# Patient Record
Sex: Male | Born: 1945 | Race: White | Hispanic: No | Marital: Married | Smoking: Former smoker
Health system: Southern US, Community
[De-identification: ages and names within clinical notes are randomized; demographics above are authoritative.]

## PROBLEM LIST (undated history)

## (undated) DIAGNOSIS — I10 Essential (primary) hypertension: Secondary | ICD-10-CM

## (undated) DIAGNOSIS — J449 Chronic obstructive pulmonary disease, unspecified: Secondary | ICD-10-CM

## (undated) DIAGNOSIS — I5020 Unspecified systolic (congestive) heart failure: Secondary | ICD-10-CM

## (undated) DIAGNOSIS — I420 Dilated cardiomyopathy: Secondary | ICD-10-CM

## (undated) DIAGNOSIS — R011 Cardiac murmur, unspecified: Secondary | ICD-10-CM

## (undated) DIAGNOSIS — E785 Hyperlipidemia, unspecified: Secondary | ICD-10-CM

## (undated) DIAGNOSIS — I739 Peripheral vascular disease, unspecified: Secondary | ICD-10-CM

## (undated) DIAGNOSIS — I4891 Unspecified atrial fibrillation: Secondary | ICD-10-CM

## (undated) DIAGNOSIS — I493 Ventricular premature depolarization: Secondary | ICD-10-CM

## (undated) HISTORY — DX: Peripheral vascular disease, unspecified: I73.9

## (undated) HISTORY — DX: Dilated cardiomyopathy: I42.0

## (undated) HISTORY — PX: TONSILLECTOMY: SUR1361

## (undated) HISTORY — DX: Ventricular premature depolarization: I49.3

## (undated) HISTORY — PX: MANDIBLE SURGERY: SHX707

## (undated) HISTORY — PX: LEG SURGERY: SHX1003

## (undated) HISTORY — DX: Essential (primary) hypertension: I10

## (undated) HISTORY — DX: Unspecified systolic (congestive) heart failure: I50.20

## (undated) HISTORY — PX: HERNIA REPAIR: SHX51

## (undated) HISTORY — PX: KNEE ARTHROSCOPY: SUR90

## (undated) HISTORY — DX: Unspecified atrial fibrillation: I48.91

## (undated) HISTORY — DX: Chronic obstructive pulmonary disease, unspecified: J44.9

## (undated) HISTORY — DX: Hyperlipidemia, unspecified: E78.5

---

## 2004-04-19 ENCOUNTER — Ambulatory Visit: Payer: Self-pay | Admitting: General Practice

## 2013-02-12 ENCOUNTER — Other Ambulatory Visit: Payer: Self-pay | Admitting: Orthopedic Surgery

## 2013-02-12 DIAGNOSIS — M545 Low back pain, unspecified: Secondary | ICD-10-CM

## 2013-02-14 ENCOUNTER — Other Ambulatory Visit: Payer: Self-pay | Admitting: Orthopedic Surgery

## 2013-02-14 DIAGNOSIS — M545 Low back pain, unspecified: Secondary | ICD-10-CM

## 2013-02-15 ENCOUNTER — Other Ambulatory Visit: Payer: Self-pay

## 2013-02-19 ENCOUNTER — Other Ambulatory Visit: Payer: Self-pay

## 2013-02-28 ENCOUNTER — Ambulatory Visit
Admission: RE | Admit: 2013-02-28 | Discharge: 2013-02-28 | Disposition: A | Discharge status: Home / Self Care | Payer: 59 | Source: Ambulatory Visit | Attending: Orthopedic Surgery | Admitting: Orthopedic Surgery

## 2013-02-28 DIAGNOSIS — M545 Low back pain, unspecified: Secondary | ICD-10-CM

## 2014-02-02 ENCOUNTER — Emergency Department: Payer: Self-pay | Admitting: Emergency Medicine

## 2014-02-02 LAB — CBC
HCT: 44 % (ref 40.0–52.0)
HGB: 14.7 g/dL (ref 13.0–18.0)
MCH: 29.1 pg (ref 26.0–34.0)
MCHC: 33.3 g/dL (ref 32.0–36.0)
MCV: 87 fL (ref 80–100)
PLATELETS: 329 10*3/uL (ref 150–440)
RBC: 5.05 10*6/uL (ref 4.40–5.90)
RDW: 13.1 % (ref 11.5–14.5)
WBC: 9.3 10*3/uL (ref 3.8–10.6)

## 2014-02-02 LAB — COMPREHENSIVE METABOLIC PANEL
ALT: 21 U/L
ANION GAP: 6 — AB (ref 7–16)
Albumin: 3.9 g/dL (ref 3.4–5.0)
Alkaline Phosphatase: 73 U/L
BUN: 15 mg/dL (ref 7–18)
Bilirubin,Total: 0.4 mg/dL (ref 0.2–1.0)
Calcium, Total: 9 mg/dL (ref 8.5–10.1)
Chloride: 104 mmol/L (ref 98–107)
Co2: 29 mmol/L (ref 21–32)
Creatinine: 1.04 mg/dL (ref 0.60–1.30)
EGFR (African American): >60
EGFR (Non-African Amer.): >60
GLUCOSE: 107 mg/dL — AB (ref 65–99)
OSMOLALITY: 279 (ref 275–301)
Potassium: 3.9 mmol/L (ref 3.5–5.1)
SGOT(AST): 16 U/L (ref 15–37)
SODIUM: 139 mmol/L (ref 136–145)
Total Protein: 7.3 g/dL (ref 6.4–8.2)

## 2014-02-02 LAB — PRO B NATRIURETIC PEPTIDE: B-TYPE NATIURETIC PEPTID: 58 pg/mL (ref 0–125)

## 2014-02-02 LAB — TROPONIN I
Troponin-I: <0.02 ng/mL
Troponin-I: <0.02 ng/mL

## 2014-02-02 LAB — CK TOTAL AND CKMB (NOT AT ARMC)
CK, Total: 149 U/L
CK-MB: 6.8 ng/mL — ABNORMAL HIGH (ref 0.5–3.6)

## 2014-02-02 LAB — D-DIMER(ARMC): D-DIMER: 695 ng/mL

## 2014-10-15 ENCOUNTER — Ambulatory Visit
Admit: 2014-10-15 | Disposition: A | Discharge status: Home / Self Care | Payer: Self-pay | Attending: Internal Medicine | Admitting: Internal Medicine

## 2015-10-25 ENCOUNTER — Other Ambulatory Visit: Payer: Self-pay | Admitting: Physician Assistant

## 2015-10-25 DIAGNOSIS — R911 Solitary pulmonary nodule: Secondary | ICD-10-CM

## 2015-11-01 ENCOUNTER — Ambulatory Visit
Admission: RE | Admit: 2015-11-01 | Discharge: 2015-11-01 | Disposition: A | Discharge status: Home / Self Care | Payer: Medicare Other | Source: Ambulatory Visit | Attending: Physician Assistant | Admitting: Physician Assistant

## 2015-11-01 DIAGNOSIS — R911 Solitary pulmonary nodule: Secondary | ICD-10-CM

## 2015-11-01 DIAGNOSIS — R918 Other nonspecific abnormal finding of lung field: Secondary | ICD-10-CM | POA: Insufficient documentation

## 2015-11-01 LAB — POCT I-STAT CREATININE: Creatinine, Ser: 0.9 mg/dL (ref 0.61–1.24)

## 2015-11-01 MED ORDER — IOPAMIDOL (ISOVUE-300) INJECTION 61%
75.0000 mL | Freq: Once | INTRAVENOUS | Status: AC | PRN
  Administered 2015-11-01: 75 mL via INTRAVENOUS

## 2015-11-08 DIAGNOSIS — I4510 Unspecified right bundle-branch block: Secondary | ICD-10-CM | POA: Diagnosis not present

## 2015-11-08 DIAGNOSIS — R911 Solitary pulmonary nodule: Secondary | ICD-10-CM | POA: Diagnosis not present

## 2015-11-08 DIAGNOSIS — J449 Chronic obstructive pulmonary disease, unspecified: Secondary | ICD-10-CM | POA: Diagnosis not present

## 2015-11-12 DIAGNOSIS — I493 Ventricular premature depolarization: Secondary | ICD-10-CM | POA: Diagnosis not present

## 2015-11-12 DIAGNOSIS — R0602 Shortness of breath: Secondary | ICD-10-CM | POA: Diagnosis not present

## 2015-11-12 DIAGNOSIS — R002 Palpitations: Secondary | ICD-10-CM | POA: Insufficient documentation

## 2015-12-01 DIAGNOSIS — J449 Chronic obstructive pulmonary disease, unspecified: Secondary | ICD-10-CM | POA: Diagnosis not present

## 2015-12-01 DIAGNOSIS — F17210 Nicotine dependence, cigarettes, uncomplicated: Secondary | ICD-10-CM | POA: Diagnosis not present

## 2015-12-01 DIAGNOSIS — N400 Enlarged prostate without lower urinary tract symptoms: Secondary | ICD-10-CM | POA: Diagnosis not present

## 2015-12-01 DIAGNOSIS — Z1211 Encounter for screening for malignant neoplasm of colon: Secondary | ICD-10-CM | POA: Diagnosis not present

## 2015-12-01 DIAGNOSIS — Z0001 Encounter for general adult medical examination with abnormal findings: Secondary | ICD-10-CM | POA: Diagnosis not present

## 2015-12-13 DIAGNOSIS — Z1211 Encounter for screening for malignant neoplasm of colon: Secondary | ICD-10-CM | POA: Diagnosis not present

## 2015-12-13 DIAGNOSIS — Z1212 Encounter for screening for malignant neoplasm of rectum: Secondary | ICD-10-CM | POA: Diagnosis not present

## 2016-01-12 DIAGNOSIS — R0602 Shortness of breath: Secondary | ICD-10-CM | POA: Diagnosis not present

## 2016-04-27 ENCOUNTER — Other Ambulatory Visit: Payer: Self-pay | Admitting: Internal Medicine

## 2016-04-27 DIAGNOSIS — R911 Solitary pulmonary nodule: Secondary | ICD-10-CM

## 2016-05-10 ENCOUNTER — Ambulatory Visit
Admission: RE | Admit: 2016-05-10 | Discharge: 2016-05-10 | Disposition: A | Discharge status: Home / Self Care | Payer: Medicare Other | Source: Ambulatory Visit | Attending: Internal Medicine | Admitting: Internal Medicine

## 2016-05-10 DIAGNOSIS — I2510 Atherosclerotic heart disease of native coronary artery without angina pectoris: Secondary | ICD-10-CM | POA: Insufficient documentation

## 2016-05-10 DIAGNOSIS — R918 Other nonspecific abnormal finding of lung field: Secondary | ICD-10-CM | POA: Diagnosis not present

## 2016-05-10 DIAGNOSIS — R911 Solitary pulmonary nodule: Secondary | ICD-10-CM | POA: Diagnosis not present

## 2016-05-10 LAB — POCT I-STAT CREATININE: Creatinine, Ser: 0.8 mg/dL (ref 0.61–1.24)

## 2016-05-10 MED ORDER — IOPAMIDOL (ISOVUE-300) INJECTION 61%
75.0000 mL | Freq: Once | INTRAVENOUS | Status: AC | PRN
  Administered 2016-05-10: 75 mL via INTRAVENOUS

## 2016-05-15 DIAGNOSIS — R0602 Shortness of breath: Secondary | ICD-10-CM | POA: Diagnosis not present

## 2016-05-15 DIAGNOSIS — R911 Solitary pulmonary nodule: Secondary | ICD-10-CM | POA: Diagnosis not present

## 2016-05-15 DIAGNOSIS — G4710 Hypersomnia, unspecified: Secondary | ICD-10-CM | POA: Diagnosis not present

## 2016-05-15 DIAGNOSIS — J449 Chronic obstructive pulmonary disease, unspecified: Secondary | ICD-10-CM | POA: Diagnosis not present

## 2016-09-26 DIAGNOSIS — G4710 Hypersomnia, unspecified: Secondary | ICD-10-CM | POA: Diagnosis not present

## 2016-12-08 DIAGNOSIS — Z0001 Encounter for general adult medical examination with abnormal findings: Secondary | ICD-10-CM | POA: Diagnosis not present

## 2016-12-08 DIAGNOSIS — L209 Atopic dermatitis, unspecified: Secondary | ICD-10-CM | POA: Diagnosis not present

## 2016-12-08 DIAGNOSIS — J449 Chronic obstructive pulmonary disease, unspecified: Secondary | ICD-10-CM | POA: Diagnosis not present

## 2016-12-08 DIAGNOSIS — G4710 Hypersomnia, unspecified: Secondary | ICD-10-CM | POA: Diagnosis not present

## 2016-12-08 DIAGNOSIS — R69 Illness, unspecified: Secondary | ICD-10-CM | POA: Diagnosis not present

## 2016-12-08 DIAGNOSIS — N400 Enlarged prostate without lower urinary tract symptoms: Secondary | ICD-10-CM | POA: Diagnosis not present

## 2017-04-11 DIAGNOSIS — K122 Cellulitis and abscess of mouth: Secondary | ICD-10-CM | POA: Diagnosis not present

## 2017-04-11 DIAGNOSIS — N400 Enlarged prostate without lower urinary tract symptoms: Secondary | ICD-10-CM | POA: Diagnosis not present

## 2017-06-07 ENCOUNTER — Ambulatory Visit: Payer: Self-pay | Admitting: Internal Medicine

## 2017-07-27 ENCOUNTER — Other Ambulatory Visit: Payer: Self-pay

## 2017-07-27 MED ORDER — TAMSULOSIN HCL 0.4 MG PO CAPS: 0.4000 mg | ORAL_CAPSULE | Freq: Every day | ORAL | 0 refills | Status: DC

## 2017-07-31 ENCOUNTER — Other Ambulatory Visit: Payer: Self-pay

## 2017-07-31 MED ORDER — TAMSULOSIN HCL 0.4 MG PO CAPS: 0.4000 mg | ORAL_CAPSULE | Freq: Every day | ORAL | 0 refills | Status: DC

## 2017-09-04 ENCOUNTER — Other Ambulatory Visit: Payer: Self-pay

## 2017-09-04 MED ORDER — TAMSULOSIN HCL 0.4 MG PO CAPS: 0.4000 mg | ORAL_CAPSULE | Freq: Every day | ORAL | 0 refills | Status: DC

## 2017-09-05 ENCOUNTER — Other Ambulatory Visit: Payer: Self-pay

## 2017-10-23 ENCOUNTER — Other Ambulatory Visit: Payer: Self-pay

## 2017-10-23 MED ORDER — TAMSULOSIN HCL 0.4 MG PO CAPS: 0.4000 mg | ORAL_CAPSULE | Freq: Every day | ORAL | 0 refills | Status: DC

## 2017-10-26 ENCOUNTER — Ambulatory Visit (INDEPENDENT_AMBULATORY_CARE_PROVIDER_SITE_OTHER): Payer: Medicare HMO | Admitting: Nurse Practitioner

## 2017-10-26 ENCOUNTER — Other Ambulatory Visit: Payer: Self-pay

## 2017-10-26 ENCOUNTER — Encounter: Payer: Self-pay | Admitting: Nurse Practitioner

## 2017-10-26 VITALS — BP 140/79 | HR 60 | Resp 16 | Ht 73.0 in | Wt 253.8 lb

## 2017-10-26 DIAGNOSIS — R300 Dysuria: Secondary | ICD-10-CM

## 2017-10-26 DIAGNOSIS — Z125 Encounter for screening for malignant neoplasm of prostate: Secondary | ICD-10-CM | POA: Diagnosis not present

## 2017-10-26 DIAGNOSIS — Z0001 Encounter for general adult medical examination with abnormal findings: Secondary | ICD-10-CM | POA: Insufficient documentation

## 2017-10-26 DIAGNOSIS — Z91030 Bee allergy status: Secondary | ICD-10-CM

## 2017-10-26 DIAGNOSIS — Z23 Encounter for immunization: Secondary | ICD-10-CM | POA: Diagnosis not present

## 2017-10-26 DIAGNOSIS — Z1159 Encounter for screening for other viral diseases: Secondary | ICD-10-CM

## 2017-10-26 MED ORDER — PNEUMOCOCCAL 13-VAL CONJ VACC IM SUSP: 0.5000 mL | INTRAMUSCULAR | 0 refills | Status: AC

## 2017-10-26 MED ORDER — EPINEPHRINE 0.15 MG/0.15ML IJ SOAJ: 0.1500 mg | INTRAMUSCULAR | 1 refills | Status: DC | PRN

## 2017-10-26 NOTE — Progress Notes (Signed)
[facility] [address] [place], [place] [address]  Internal MEDICINE  Office Visit Note  Patient Name: Casey Arias  [id number]  [id number]  Date of Service: 10/26/2017   Pt is here for routine health maintenance examinatio  Chief Complaint  Patient presents with  . Annual Exam  . Allergic Reaction    bee stings. needs refill for his epipens     The patient states that he is developing intermittent constipation. Is having some hard and small stools.notes a little cramping. Denies presence of blood in stool or other complications. 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 Prescription for EpiPen is expired and he needs a new one. He is due to have routine, fasting labs.   n  Current Medication: Outpatient Encounter Medications as of 10/26/2017  Medication Sig  . EPINEPHrine 0.15 MG/0.15ML IJ injection Inject 0.15 mLs (0.15 mg total) into the muscle as needed for anaphylaxis.  Marland Kitchen tamsulosin (FLOMAX) 0.4 MG CAPS capsule Take 1 capsule (0.4 mg total) by mouth daily.  Marland Kitchen triamcinolone cream (KENALOG) 0.1 % continuously as needed.  . [DISCONTINUED] EPINEPHrine 0.15 MG/0.15ML IJ injection Inject into the muscle.  . pneumococcal 13-valent conjugate vaccine (PREVNAR 13) SUSP injection Inject 0.5 mLs into the muscle tomorrow at 10 am for 1 dose.   No facility-administered encounter medications on file as of 10/26/2017.     Surgical History: History reviewed. No pertinent surgical history.  Medical History: Past Medical History:  Diagnosis Date  . COPD (chronic obstructive pulmonary disease) (HCC)   . Hyperlipidemia     Family History: No family history on file.    Review of Systems  Constitutional: Negative for chills, fatigue and unexpected weight change.  HENT: Negative for congestion, postnasal drip, rhinorrhea, sneezing and sore throat.   Eyes: Negative.  Negative for redness.  Respiratory: Negative for cough, chest tightness, shortness of breath and  wheezing.   Cardiovascular: Negative for chest pain and palpitations.  Gastrointestinal: Positive for constipation. Negative for abdominal pain, diarrhea, nausea and vomiting.  Endocrine: Negative for cold intolerance, heat intolerance, polydipsia, polyphagia and polyuria.  Genitourinary: Negative for dysuria, frequency, testicular pain and urgency.  Musculoskeletal: Negative for arthralgias, back pain, joint swelling and neck pain.  Skin: Negative for rash.  Allergic/Immunologic:       Allergy to bee stings.   Neurological: Negative.  Negative for tremors and numbness.  Hematological: Negative for adenopathy. Does not bruise/bleed easily.  Psychiatric/Behavioral: Negative for behavioral problems (Depression), sleep disturbance and suicidal ideas. The patient is not nervous/anxious.      Vital Signs: BP 140/79 (BP Location: Right Arm, Patient Position: Sitting, Cuff Size: Normal)   Pulse 60   Resp 16   Ht 6\' 1"  (1.854 m)   Wt 253 lb 12.8 oz (115.1 kg)   SpO2 96%   BMI 33.48 kg/m    Physical Exam  Constitutional: He is oriented to person, place, and time. He appears well-developed and well-nourished. No distress.  HENT:  Head: Normocephalic and atraumatic.  Mouth/Throat: Oropharynx is clear and moist. No oropharyngeal exudate.  Eyes: Pupils are equal, round, and reactive to light. EOM are normal.  Neck: Normal range of motion. Neck supple. No JVD present. No tracheal deviation present. No thyromegaly present.  Cardiovascular: Normal rate, regular rhythm and normal heart sounds. Exam reveals no gallop and no friction rub.  No murmur heard. Pulmonary/Chest: Effort normal. No respiratory distress. He has no wheezes. He has no rales. He exhibits no tenderness.  Abdominal: Soft.  Bowel sounds are normal.  Musculoskeletal: Normal range of motion.  Lymphadenopathy:    He has no cervical adenopathy.  Neurological: He is alert and oriented to person, place, and time. No cranial nerve  deficit.  Skin: Skin is warm and dry. He is not diaphoretic.  Psychiatric: He has a normal mood and affect. His behavior is normal. Judgment and thought content normal.  Nursing note and vitals reviewed.  Assessment/Plan: 1. Encounter for general adult medical examination with abnormal findings Annual health maintenance exam. Routine fasting labs ordered.  - CBC with Differential/Platelet - Comprehensive metabolic panel - Lipid panel - TSH  2. Allergy to bee sting Renewed rx for epipen. Use as indicated.  - EPINEPHrine 0.15 MG/0.15ML IJ injection; Inject 0.15 mLs (0.15 mg total) into the muscle as needed for anaphylaxis.  Dispense: 2 Device; Refill: 1  3. Screening for prostate cancer - PSA  4. Need for prophylactic vaccination against Streptococcus pneumoniae (pneumococcus) - pneumococcal 13-valent conjugate vaccine (PREVNAR 13) SUSP injection; Inject 0.5 mLs into the muscle tomorrow at 10 am for 1 dose.  Dispense: 0.5 mL; Refill: 0  5. Dysuria - Urinalysis, Routine w reflex microscopic  General Counseling: Casey Arias understanding of the findings of todays visit and agrees with plan of treatment. I have discussed any further diagnostic evaluation that may be needed or ordered today. We also reviewed his medications today. he has been encouraged to call the office with any questions or concerns that should arise related to todays visit.   This patient was seen by Vincent Gros, FNP- C in Collaboration with Dr Lyndon Code as a part of collaborative care agreement    Orders Placed This Encounter  Procedures  . Urinalysis, Routine w reflex microscopic  . PSA  . CBC with Differential/Platelet  . Comprehensive metabolic panel  . Lipid panel  . TSH  . Hepatitis C antibody    Meds ordered this encounter  Medications  . pneumococcal 13-valent conjugate vaccine (PREVNAR 13) SUSP injection    Sig: Inject 0.5 mLs into the muscle tomorrow at 10 am for 1 dose.     Dispense:  0.5 mL    Refill:  0    Order Specific Question:   Supervising Provider    Answer:   Lyndon Code [[id number]]  . EPINEPHrine 0.15 MG/0.15ML IJ injection    Sig: Inject 0.15 mLs (0.15 mg total) into the muscle as needed for anaphylaxis.    Dispense:  2 Device    Refill:  1    Order Specific Question:   Supervising Provider    Answer:   Lyndon Code [[id number]]    Time spent: 34 Minutes      Lyndon Code, MD  Internal Medicine

## 2017-10-26 NOTE — Addendum Note (Signed)
Addended by: Loura Back on: 10/26/2017 04:41 PM   Modules accepted: Orders

## 2017-10-27 LAB — URINALYSIS, ROUTINE W REFLEX MICROSCOPIC
Bilirubin, UA: NEGATIVE
GLUCOSE, UA: NEGATIVE
Ketones, UA: NEGATIVE
Leukocytes, UA: NEGATIVE
NITRITE UA: NEGATIVE
Protein, UA: NEGATIVE
RBC, UA: NEGATIVE
Specific Gravity, UA: 1.01 (ref 1.005–1.030)
UUROB: 0.2 mg/dL (ref 0.2–1.0)
pH, UA: 6 (ref 5.0–7.5)

## 2017-11-05 ENCOUNTER — Other Ambulatory Visit: Payer: Self-pay

## 2017-11-06 ENCOUNTER — Other Ambulatory Visit: Payer: Self-pay | Admitting: Nurse Practitioner

## 2017-11-06 DIAGNOSIS — E782 Mixed hyperlipidemia: Secondary | ICD-10-CM | POA: Diagnosis not present

## 2017-11-06 DIAGNOSIS — R5381 Other malaise: Secondary | ICD-10-CM | POA: Diagnosis not present

## 2017-11-06 DIAGNOSIS — R972 Elevated prostate specific antigen [PSA]: Secondary | ICD-10-CM | POA: Diagnosis not present

## 2017-11-06 DIAGNOSIS — Z1159 Encounter for screening for other viral diseases: Secondary | ICD-10-CM | POA: Diagnosis not present

## 2017-11-06 DIAGNOSIS — I10 Essential (primary) hypertension: Secondary | ICD-10-CM | POA: Diagnosis not present

## 2017-11-06 DIAGNOSIS — E039 Hypothyroidism, unspecified: Secondary | ICD-10-CM | POA: Diagnosis not present

## 2017-11-06 DIAGNOSIS — Z125 Encounter for screening for malignant neoplasm of prostate: Secondary | ICD-10-CM | POA: Diagnosis not present

## 2017-11-06 DIAGNOSIS — Z0001 Encounter for general adult medical examination with abnormal findings: Secondary | ICD-10-CM | POA: Diagnosis not present

## 2017-11-06 DIAGNOSIS — R5383 Other fatigue: Secondary | ICD-10-CM | POA: Diagnosis not present

## 2017-11-07 LAB — CBC WITH DIFFERENTIAL/PLATELET
BASOS: 1 %
Basophils Absolute: 0.1 10*3/uL (ref 0.0–0.2)
EOS (ABSOLUTE): 0.2 10*3/uL (ref 0.0–0.4)
Eos: 3 %
HEMOGLOBIN: 15 g/dL (ref 13.0–17.7)
Hematocrit: 44.4 % (ref 37.5–51.0)
IMMATURE GRANS (ABS): 0 10*3/uL (ref 0.0–0.1)
Immature Granulocytes: 0 %
LYMPHS ABS: 2.8 10*3/uL (ref 0.7–3.1)
LYMPHS: 31 %
MCH: 29.4 pg (ref 26.6–33.0)
MCHC: 33.8 g/dL (ref 31.5–35.7)
MCV: 87 fL (ref 79–97)
MONOCYTES: 8 %
Monocytes Absolute: 0.8 10*3/uL (ref 0.1–0.9)
NEUTROS ABS: 5.2 10*3/uL (ref 1.4–7.0)
Neutrophils: 57 %
Platelets: 302 10*3/uL (ref 150–379)
RBC: 5.11 x10E6/uL (ref 4.14–5.80)
RDW: 13.4 % (ref 12.3–15.4)
WBC: 9 10*3/uL (ref 3.4–10.8)

## 2017-11-07 LAB — T4, FREE: Free T4: 1.28 ng/dL (ref 0.82–1.77)

## 2017-11-07 LAB — COMPREHENSIVE METABOLIC PANEL
ALBUMIN: 4.4 g/dL (ref 3.6–4.8)
ALK PHOS: 59 IU/L (ref 39–117)
ALT: 19 IU/L (ref 0–44)
AST: 23 IU/L (ref 0–40)
Albumin/Globulin Ratio: 1.8 (ref 1.2–2.2)
BILIRUBIN TOTAL: 0.4 mg/dL (ref 0.0–1.2)
BUN/Creatinine Ratio: 16 (ref 10–24)
BUN: 14 mg/dL (ref 8–27)
CHLORIDE: 102 mmol/L (ref 96–106)
CO2: 22 mmol/L (ref 20–29)
CREATININE: 0.88 mg/dL (ref 0.76–1.27)
Calcium: 9.6 mg/dL (ref 8.6–10.2)
GFR calc Af Amer: 101 mL/min/{1.73_m2} (ref >59)
GFR calc non Af Amer: 88 mL/min/{1.73_m2} (ref >59)
Globulin, Total: 2.5 g/dL (ref 1.5–4.5)
Glucose: 112 mg/dL — ABNORMAL HIGH (ref 65–99)
Potassium: 4.3 mmol/L (ref 3.5–5.2)
SODIUM: 139 mmol/L (ref 134–144)
Total Protein: 6.9 g/dL (ref 6.0–8.5)

## 2017-11-07 LAB — LIPID PANEL
CHOLESTEROL TOTAL: 210 mg/dL — AB (ref 100–199)
Chol/HDL Ratio: 4.3 ratio (ref 0.0–5.0)
HDL: 49 mg/dL (ref >39)
LDL CALC: 144 mg/dL — AB (ref 0–99)
TRIGLYCERIDES: 84 mg/dL (ref 0–149)
VLDL CHOLESTEROL CAL: 17 mg/dL (ref 5–40)

## 2017-11-07 LAB — PSA: PROSTATE SPECIFIC AG, SERUM: 5.5 ng/mL — AB (ref 0.0–4.0)

## 2017-11-07 LAB — TSH: TSH: 2.13 u[IU]/mL (ref 0.450–4.500)

## 2017-11-07 LAB — HEPATITIS C ANTIBODY: Hep C Virus Ab: <0.1 s/co ratio (ref 0.0–0.9)

## 2017-11-14 ENCOUNTER — Other Ambulatory Visit: Payer: Self-pay

## 2017-11-14 MED ORDER — EPINEPHRINE 0.3 MG/0.3ML IJ SOAJ: INTRAMUSCULAR | 1 refills | Status: DC

## 2017-11-16 NOTE — Progress Notes (Signed)
Pt was notified. Message will be forwarded to [facility].

## 2017-11-22 ENCOUNTER — Other Ambulatory Visit: Payer: Self-pay | Admitting: Nurse Practitioner

## 2017-11-22 MED ORDER — TAMSULOSIN HCL 0.4 MG PO CAPS: 0.4000 mg | ORAL_CAPSULE | Freq: Every day | ORAL | 3 refills | Status: DC

## 2017-11-30 ENCOUNTER — Telehealth: Payer: Self-pay

## 2017-11-30 NOTE — Telephone Encounter (Signed)
Lab order faxed to [facility] on 11/05/17 for test, CMP, Lipid panel w/TC_HDL, CBC w Diff, PSA, Thyroxine t4 free, TSH, Hep C antibody.  dbs

## 2017-12-25 ENCOUNTER — Encounter: Payer: Self-pay | Admitting: Urology

## 2017-12-25 ENCOUNTER — Ambulatory Visit (INDEPENDENT_AMBULATORY_CARE_PROVIDER_SITE_OTHER): Payer: Medicare HMO | Admitting: Urology

## 2017-12-25 VITALS — BP 135/74 | HR 54 | Ht 73.0 in | Wt 252.8 lb

## 2017-12-25 DIAGNOSIS — N401 Enlarged prostate with lower urinary tract symptoms: Secondary | ICD-10-CM

## 2017-12-25 DIAGNOSIS — N138 Other obstructive and reflux uropathy: Secondary | ICD-10-CM | POA: Diagnosis not present

## 2017-12-25 DIAGNOSIS — R972 Elevated prostate specific antigen [PSA]: Secondary | ICD-10-CM
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
## 2017-12-26 LAB — PSA: PROSTATE SPECIFIC AG, SERUM: 5.4 ng/mL — AB (ref 0.0–4.0)

## 2017-12-28 ENCOUNTER — Telehealth: Payer: Self-pay

## 2017-12-28 NOTE — Telephone Encounter (Signed)
Pt informed

## 2017-12-28 NOTE — Telephone Encounter (Signed)
-----   Message from Vanna Scotland, MD sent at 12/26/2017 11:18 AM EDT ----- PSA stable! Great news.  See you next year as discussed.    Vanna Scotland, MD

## 2018-02-21 ENCOUNTER — Other Ambulatory Visit: Payer: Self-pay

## 2018-02-21 MED ORDER — TRIAMCINOLONE ACETONIDE 0.1 % EX CREA: TOPICAL_CREAM | CUTANEOUS | 0 refills | Status: DC

## 2018-03-19 ENCOUNTER — Other Ambulatory Visit: Payer: Self-pay

## 2018-03-19 MED ORDER — TAMSULOSIN HCL 0.4 MG PO CAPS: 0.4000 mg | ORAL_CAPSULE | Freq: Every day | ORAL | 3 refills | Status: DC

## 2018-05-06 ENCOUNTER — Encounter: Payer: Self-pay | Admitting: Nurse Practitioner

## 2018-05-06 ENCOUNTER — Ambulatory Visit (INDEPENDENT_AMBULATORY_CARE_PROVIDER_SITE_OTHER): Payer: Medicare HMO | Admitting: Nurse Practitioner

## 2018-05-06 VITALS — BP 128/85 | HR 63 | Resp 16 | Ht 73.0 in | Wt 258.0 lb

## 2018-05-06 DIAGNOSIS — N400 Enlarged prostate without lower urinary tract symptoms: Secondary | ICD-10-CM | POA: Diagnosis not present

## 2018-05-06 DIAGNOSIS — L209 Atopic dermatitis, unspecified: Secondary | ICD-10-CM

## 2018-05-06 DIAGNOSIS — R69 Illness, unspecified: Secondary | ICD-10-CM | POA: Diagnosis not present

## 2018-05-06 DIAGNOSIS — Z23 Encounter for immunization: Secondary | ICD-10-CM

## 2018-05-06 MED ORDER — TAMSULOSIN HCL 0.4 MG PO CAPS: 0.4000 mg | ORAL_CAPSULE | Freq: Every day | ORAL | 5 refills | Status: DC

## 2018-05-06 MED ORDER — TRIAMCINOLONE ACETONIDE 0.1 % EX CREA
TOPICAL_CREAM | CUTANEOUS | 3 refills | Status: DC
Start: 2018-05-06 — End: 2018-11-05

## 2018-05-06 NOTE — Progress Notes (Signed)
[facility] [address] [place], [place] [address]  Internal MEDICINE  Office Visit Note  Patient Name: Casey Arias  [id number]  [id number]  Date of Service: 05/08/2018  Chief Complaint  Patient presents with  . Hyperlipidemia  . Quality Metric Gaps    prevnar 13     The patient is here for routine follow up exam. He feels good today without concerns or complaints. He has had routine, fasting labs done earlier this year. Had elevated PSA. Has seen urology for this. Is currently taking tamsulosin every day. PSA continues to be followed by the urologist. His labs also indicated a mild elevation of LDL and total cholesterol. He is following a prudent diet and exercising frequently.  He is due to have Prevnar 13 vaccination. A prescription will be sent to his pharmacy for administration.       Current Medication: Outpatient Encounter Medications as of 05/06/2018  Medication Sig  . EPINEPHrine (EPIPEN 2-PAK) 0.3 mg/0.3 mL IJ SOAJ injection Use as directed  as needed for anaphylaxis  . tamsulosin (FLOMAX) 0.4 MG CAPS capsule Take 1 capsule (0.4 mg total) by mouth daily.  Marland Kitchen triamcinolone cream (KENALOG) 0.1 % Apply to affected area twice a day as needed  . [DISCONTINUED] tamsulosin (FLOMAX) 0.4 MG CAPS capsule Take 1 capsule (0.4 mg total) by mouth daily.  . [DISCONTINUED] triamcinolone cream (KENALOG) 0.1 % Apply to affected area twice a day as needed   No facility-administered encounter medications on file as of 05/06/2018.     Surgical History: History reviewed. No pertinent surgical history.  Medical History: Past Medical History:  Diagnosis Date  . COPD (chronic obstructive pulmonary disease) (HCC)   . Hyperlipidemia     Family History: Family History  Problem Relation Age of Onset  . Lymphoma Father   . Bladder Cancer Mother   . Prostate cancer Paternal Grandfather   . Kidney cancer Neg Hx     Social History   Socioeconomic History  .  Marital status: Married    Spouse name: Not on file  . Number of children: Not on file  . Years of education: Not on file  . Highest education level: Not on file  Occupational History  . Not on file  Social Needs  . Financial resource strain: Not on file  . Food insecurity:    Worry: Not on file    Inability: Not on file  . Transportation needs:    Medical: Not on file    Non-medical: Not on file  Tobacco Use  . Smoking status: Former Games developer  . Smokeless tobacco: Never Used  . Tobacco comment: quit 41yrs ago  Substance and Sexual Activity  . Alcohol use: Never    Frequency: Never  . Drug use: Never  . Sexual activity: Not on file  Lifestyle  . Physical activity:    Days per week: Not on file    Minutes per session: Not on file  . Stress: Not on file  Relationships  . Social connections:    Talks on phone: Not on file    Gets together: Not on file    Attends religious service: Not on file    Active member of club or organization: Not on file    Attends meetings of clubs or organizations: Not on file    Relationship status: Not on file  . Intimate partner violence:    Fear of current or ex partner: Not on file    Emotionally abused: Not  on file    Physically abused: Not on file    Forced sexual activity: Not on file  Other Topics Concern  . Not on file  Social History Narrative  . Not on file      Review of Systems  Constitutional: Negative for chills, fatigue and unexpected weight change.  HENT: Negative for congestion, postnasal drip, rhinorrhea, sneezing and sore throat.   Eyes: Negative.  Negative for redness.  Respiratory: Negative for cough, chest tightness, shortness of breath and wheezing.   Cardiovascular: Negative for chest pain and palpitations.  Gastrointestinal: Negative for abdominal pain, constipation, diarrhea, nausea and vomiting.  Endocrine: Negative for cold intolerance, heat intolerance, polydipsia, polyphagia and polyuria.  Musculoskeletal:  Negative for arthralgias, back pain, joint swelling and neck pain.  Skin: Positive for rash.       Patches of eczema which are intermittent.   Allergic/Immunologic:       Allergy to bee stings.   Neurological: Negative for dizziness, tremors, numbness and headaches.  Hematological: Negative for adenopathy. Does not bruise/bleed easily.  Psychiatric/Behavioral: Negative for behavioral problems (Depression), sleep disturbance and suicidal ideas. The patient is not nervous/anxious.     Vital Signs: BP 128/85 (BP Location: Right Arm, Patient Position: Sitting, Cuff Size: Large)   Pulse 63   Resp 16   Ht 6\' 1"  (1.854 m)   Wt 258 lb (117 kg)   SpO2 95% Comment: room air  BMI 34.04 kg/m    Physical Exam  Constitutional: He is oriented to person, place, and time. He appears well-developed and well-nourished. No distress.  HENT:  Head: Normocephalic and atraumatic.  Mouth/Throat: No oropharyngeal exudate.  Eyes: Pupils are equal, round, and reactive to light. EOM are normal.  Neck: Normal range of motion. Neck supple. No JVD present. No tracheal deviation present. No thyromegaly present.  Cardiovascular: Normal rate, regular rhythm and normal heart sounds. Exam reveals no gallop and no friction rub.  No murmur heard. Pulmonary/Chest: Effort normal and breath sounds normal. No respiratory distress. He has no wheezes. He has no rales. He exhibits no tenderness.  Musculoskeletal: Normal range of motion.  Lymphadenopathy:    He has no cervical adenopathy.  Neurological: He is alert and oriented to person, place, and time. No cranial nerve deficit.  Skin: Skin is warm and dry. He is not diaphoretic.  Psychiatric: He has a normal mood and affect. His behavior is normal. Judgment and thought content normal.  Nursing note and vitals reviewed.  Assessment/Plan: 1. Atopic dermatitis, unspecified type Intermittent. May continue to use triamcinolone 0.1% cream twice daily when needed. New  prescription sent today. - triamcinolone cream (KENALOG) 0.1 %; Apply to affected area twice a day as needed  Dispense: 60 g; Refill: 3  2. Benign prostatic hyperplasia without lower urinary tract symptoms Continue to take flomac 0.4mg  daily. He should follow up with urology as scheduled.  - tamsulosin (FLOMAX) 0.4 MG CAPS capsule; Take 1 capsule (0.4 mg total) by mouth daily.  Dispense: 30 capsule; Refill: 5  3. Need for vaccination against Streptococcus pneumoniae using pneumococcal conjugate vaccine 13 - Pneumococcal conjugate vaccine 13-valent IM  General Counseling: Moeez verbalizes understanding of the findings of todays visit and agrees with plan of treatment. I have discussed any further diagnostic evaluation that may be needed or ordered today. We also reviewed his medications today. he has been encouraged to call the office with any questions or concerns that should arise related to todays visit.  This patient was seen by  Vincent Gros FNP Collaboration with Dr Lyndon Code as a part of collaborative care agreement  Orders Placed This Encounter  Procedures  . Pneumococcal conjugate vaccine 13-valent IM    Meds ordered this encounter  Medications  . tamsulosin (FLOMAX) 0.4 MG CAPS capsule    Sig: Take 1 capsule (0.4 mg total) by mouth daily.    Dispense:  30 capsule    Refill:  5    Patient needs appointment for future refills    Order Specific Question:   Supervising Provider    Answer:   Lyndon Code [[id number]]  . triamcinolone cream (KENALOG) 0.1 %    Sig: Apply to affected area twice a day as needed    Dispense:  60 g    Refill:  3    Order Specific Question:   Supervising Provider    Answer:   Lyndon Code [[id number]]    Time spent: 55 Minutes      Dr Lyndon Code Internal medicine

## 2018-05-08 DIAGNOSIS — N400 Enlarged prostate without lower urinary tract symptoms: Secondary | ICD-10-CM | POA: Insufficient documentation

## 2018-05-08 DIAGNOSIS — L209 Atopic dermatitis, unspecified: Secondary | ICD-10-CM | POA: Insufficient documentation

## 2018-08-21 ENCOUNTER — Other Ambulatory Visit: Payer: Self-pay

## 2018-08-21 MED ORDER — ALBUTEROL SULFATE HFA 108 (90 BASE) MCG/ACT IN AERS: INHALATION_SPRAY | RESPIRATORY_TRACT | 1 refills | Status: DC

## 2018-10-01 ENCOUNTER — Encounter: Payer: Self-pay | Admitting: Adult Health

## 2018-10-01 ENCOUNTER — Ambulatory Visit (INDEPENDENT_AMBULATORY_CARE_PROVIDER_SITE_OTHER): Payer: Medicare HMO | Admitting: Adult Health

## 2018-10-01 ENCOUNTER — Other Ambulatory Visit: Payer: Self-pay

## 2018-10-01 VITALS — Temp 99.1°F | Ht 73.0 in | Wt 245.0 lb

## 2018-10-01 DIAGNOSIS — K047 Periapical abscess without sinus: Secondary | ICD-10-CM | POA: Diagnosis not present

## 2018-10-01 MED ORDER — AMOXICILLIN-POT CLAVULANATE 875-125 MG PO TABS: 1.0000 | ORAL_TABLET | Freq: Two times a day (BID) | ORAL | 0 refills | Status: DC

## 2018-10-01 NOTE — Progress Notes (Signed)
[facility] [address] [place], [place] [address]  Internal MEDICINE  Telephone Visit  Patient Name: Casey Arias  [id number]  [id number]  Date of Service: 10/01/2018  I connected with the patient at 930 by telephone and verified the patients identity using two identifiers.   I discussed the limitations, risks, security and privacy concerns of performing an evaluation and management service by telephone and the availability of in person appointments. I also discussed with the patient that there may be a patient responsible charge related to the service.  The patient expressed understanding and agrees to proceed.    Chief Complaint  Patient presents with  . Telephone Screen    infection in teeth , bad teeth gum boyles for two weeks   . Telephone Assessment    HPI  Pt reports dental infection, with purulent drainage.  Has a history of dental abscess with extractions. He reports he has been dealing with this for a few weeks.  He denies fever at this time.     Current Medication: Outpatient Encounter Medications as of 10/01/2018  Medication Sig  . albuterol (VENTOLIN HFA) 108 (90 Base) MCG/ACT inhaler Inhale 2 puffs every 4 to 6 hrs as needed for sob  . EPINEPHrine (EPIPEN 2-PAK) 0.3 mg/0.3 mL IJ SOAJ injection Use as directed  as needed for anaphylaxis  . tamsulosin (FLOMAX) 0.4 MG CAPS capsule Take 1 capsule (0.4 mg total) by mouth daily.  Marland Kitchen triamcinolone cream (KENALOG) 0.1 % Apply to affected area twice a day as needed  . amoxicillin-clavulanate (AUGMENTIN) 875-125 MG tablet Take 1 tablet by mouth 2 (two) times daily.   No facility-administered encounter medications on file as of 10/01/2018.     Surgical History: History reviewed. No pertinent surgical history.  Medical History: Past Medical History:  Diagnosis Date  . COPD (chronic obstructive pulmonary disease) (HCC)   . Hyperlipidemia     Family History: Family History  Problem Relation Age of Onset   . Lymphoma Father   . Bladder Cancer Mother   . Prostate cancer Paternal Grandfather   . Kidney cancer Neg Hx     Social History   Socioeconomic History  . Marital status: Married    Spouse name: Not on file  . Number of children: Not on file  . Years of education: Not on file  . Highest education level: Not on file  Occupational History  . Not on file  Social Needs  . Financial resource strain: Not on file  . Food insecurity:    Worry: Not on file    Inability: Not on file  . Transportation needs:    Medical: Not on file    Non-medical: Not on file  Tobacco Use  . Smoking status: Former Games developer  . Smokeless tobacco: Never Used  . Tobacco comment: quit 57yrs ago  Substance and Sexual Activity  . Alcohol use: Never    Frequency: Never  . Drug use: Never  . Sexual activity: Not on file  Lifestyle  . Physical activity:    Days per week: Not on file    Minutes per session: Not on file  . Stress: Not on file  Relationships  . Social connections:    Talks on phone: Not on file    Gets together: Not on file    Attends religious service: Not on file    Active member of club or organization: Not on file    Attends meetings of clubs or organizations: Not on file  Relationship status: Not on file  . Intimate partner violence:    Fear of current or ex partner: Not on file    Emotionally abused: Not on file    Physically abused: Not on file    Forced sexual activity: Not on file  Other Topics Concern  . Not on file  Social History Narrative  . Not on file      Review of Systems  Constitutional: Negative.  Negative for chills, fatigue and unexpected weight change.  HENT: Negative.  Negative for congestion, rhinorrhea, sneezing and sore throat.   Eyes: Negative for redness.  Respiratory: Negative.  Negative for cough, chest tightness and shortness of breath.   Cardiovascular: Negative.  Negative for chest pain and palpitations.  Gastrointestinal: Negative.   Negative for abdominal pain, constipation, diarrhea, nausea and vomiting.  Endocrine: Negative.   Genitourinary: Negative.  Negative for dysuria and frequency.  Musculoskeletal: Negative.  Negative for arthralgias, back pain, joint swelling and neck pain.  Skin: Negative.  Negative for rash.  Allergic/Immunologic: Negative.   Neurological: Negative.  Negative for tremors and numbness.  Hematological: Negative for adenopathy. Does not bruise/bleed easily.  Psychiatric/Behavioral: Negative.  Negative for behavioral problems, sleep disturbance and suicidal ideas. The patient is not nervous/anxious.     Vital Signs: Temp 99.1 F (37.3 C)   Ht 6\' 1"  (1.854 m)   Wt 245 lb (111.1 kg)   BMI 32.32 kg/m    Observation/Objective: NAD noted.  Pt was able to carry on conversation.reports pain and purulent drainage from around infected tooth.     Assessment/Plan: 1. Dental abscess Advised patient to take entire course of antibiotics as prescribed with food. Pt should return to clinic in 7-10 days if symptoms fail to improve or new symptoms develop.  - amoxicillin-clavulanate (AUGMENTIN) 875-125 MG tablet; Take 1 tablet by mouth 2 (two) times daily.  Dispense: 20 tablet; Refill: 0  General Counseling: Casey Arias understanding of the findings of today's phone visit and agrees with plan of treatment. I have discussed any further diagnostic evaluation that may be needed or ordered today. We also reviewed his medications today. he has been encouraged to call the office with any questions or concerns that should arise related to todays visit.    No orders of the defined types were placed in this encounter.   Meds ordered this encounter  Medications  . amoxicillin-clavulanate (AUGMENTIN) 875-125 MG tablet    Sig: Take 1 tablet by mouth 2 (two) times daily.    Dispense:  20 tablet    Refill:  0    Time spent: 13 Minutes    Blima Ledger AGNP-C Internal medicine

## 2018-11-05 ENCOUNTER — Encounter: Payer: Self-pay | Admitting: Nurse Practitioner

## 2018-11-05 ENCOUNTER — Other Ambulatory Visit: Payer: Self-pay

## 2018-11-05 ENCOUNTER — Ambulatory Visit: Payer: Medicare HMO | Admitting: Nurse Practitioner

## 2018-11-05 VITALS — Temp 98.6°F

## 2018-11-05 DIAGNOSIS — N400 Enlarged prostate without lower urinary tract symptoms: Secondary | ICD-10-CM

## 2018-11-05 DIAGNOSIS — L209 Atopic dermatitis, unspecified: Secondary | ICD-10-CM | POA: Diagnosis not present

## 2018-11-05 MED ORDER — TRIAMCINOLONE ACETONIDE 0.1 % EX CREA: TOPICAL_CREAM | CUTANEOUS | 3 refills | Status: DC

## 2018-11-05 MED ORDER — TAMSULOSIN HCL 0.4 MG PO CAPS: 0.4000 mg | ORAL_CAPSULE | Freq: Every day | ORAL | 3 refills | Status: DC

## 2018-11-05 NOTE — Progress Notes (Signed)
[facility] [address] [place], [place] [id number]  Internal MEDICINE  Telephone Visit  Patient Name: Casey HolterMichael K Noteboom  [id number]  [id number]  Date of Service: 11/05/2018  I connected with the patient at 8:43am by telephone and verified the patients identity using two identifiers.   I discussed the limitations, risks, security and privacy concerns of performing an evaluation and management service by telephone and the availability of in person appointments. I also discussed with the patient that there may be a patient responsible charge related to the service.  The patient expressed understanding and agrees to proceed.    Chief Complaint  Patient presents with  . Telephone Screen  . Telephone Assessment    The patient has been contacted via telephone for follow up visit due to concerns for spread of novel coronavirus. The patient states that he had incident a few weeks ago with headache, fever, body aches, and joint pain. He also lost his ability to taste foods. Symptoms lasted for five or six days, then resolved on their own. He did take OTC pain and fever reducers. He also did have abscessed tooth, was prescribed augmentin for 10 days. Did clear up infection. Currently no issues going on. Not having to use his rescue inhaler at all.       Current Medication: Outpatient Encounter Medications as of 11/05/2018  Medication Sig  . albuterol (VENTOLIN HFA) 108 (90 Base) MCG/ACT inhaler Inhale 2 puffs every 4 to 6 hrs as needed for sob  . EPINEPHrine (EPIPEN 2-PAK) 0.3 mg/0.3 mL IJ SOAJ injection Use as directed  as needed for anaphylaxis  . tamsulosin (FLOMAX) 0.4 MG CAPS capsule Take 1 capsule (0.4 mg total) by mouth daily.  Marland Kitchen. triamcinolone cream (KENALOG) 0.1 % Apply to affected area twice a day as needed  . [DISCONTINUED] tamsulosin (FLOMAX) 0.4 MG CAPS capsule Take 1 capsule (0.4 mg total) by mouth daily.  . [DISCONTINUED] triamcinolone cream (KENALOG) 0.1 % Apply to  affected area twice a day as needed  . amoxicillin-clavulanate (AUGMENTIN) 875-125 MG tablet Take 1 tablet by mouth 2 (two) times daily. (Patient not taking: Reported on 11/05/2018)   No facility-administered encounter medications on file as of 11/05/2018.     Surgical History: History reviewed. No pertinent surgical history.  Medical History: Past Medical History:  Diagnosis Date  . COPD (chronic obstructive pulmonary disease) (HCC)   . Hyperlipidemia     Family History: Family History  Problem Relation Age of Onset  . Lymphoma Father   . Bladder Cancer Mother   . Prostate cancer Paternal Grandfather   . Kidney cancer Neg Hx     Social History   Socioeconomic History  . Marital status: Married    Spouse name: Not on file  . Number of children: Not on file  . Years of education: Not on file  . Highest education level: Not on file  Occupational History  . Not on file  Social Needs  . Financial resource strain: Not on file  . Food insecurity:    Worry: Not on file    Inability: Not on file  . Transportation needs:    Medical: Not on file    Non-medical: Not on file  Tobacco Use  . Smoking status: Former Games developermoker  . Smokeless tobacco: Never Used  . Tobacco comment: quit 5092yrs ago  Substance and Sexual Activity  . Alcohol use: Never    Frequency: Never  . Drug use: Never  . Sexual activity: Not on  file  Lifestyle  . Physical activity:    Days per week: Not on file    Minutes per session: Not on file  . Stress: Not on file  Relationships  . Social connections:    Talks on phone: Not on file    Gets together: Not on file    Attends religious service: Not on file    Active member of club or organization: Not on file    Attends meetings of clubs or organizations: Not on file    Relationship status: Not on file  . Intimate partner violence:    Fear of current or ex partner: Not on file    Emotionally abused: Not on file    Physically abused: Not on file     Forced sexual activity: Not on file  Other Topics Concern  . Not on file  Social History Narrative  . Not on file      Review of Systems  Constitutional: Negative for chills, fatigue and unexpected weight change.  HENT: Negative for congestion, rhinorrhea, sneezing and sore throat.   Respiratory: Negative for cough, chest tightness, shortness of breath and wheezing.   Cardiovascular: Negative for chest pain and palpitations.  Gastrointestinal: Negative for abdominal pain, constipation, diarrhea, nausea and vomiting.  Endocrine: Negative for cold intolerance, heat intolerance, polydipsia and polyuria.  Musculoskeletal: Negative for arthralgias, back pain, joint swelling and neck pain.  Skin: Negative for rash.       Has intermittent rash/eczema  Allergic/Immunologic: Positive for environmental allergies.  Neurological: Positive for seizures. Negative for tremors and numbness.  Hematological: Negative for adenopathy. Does not bruise/bleed easily.  Psychiatric/Behavioral: Negative for behavioral problems, sleep disturbance and suicidal ideas. The patient is not nervous/anxious.     Today's Vitals   11/05/18 0834  Temp: 98.6 F (37 C)   There is no height or weight on file to calculate BMI.  Observation/Objective:  The patient is alert and oriented. He is pleasant and answering all questions appropriately. Breathing is non-labored. He is in no acute distress.    Assessment/Plan: 1. 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 Refilled as 90 day prescription and sent to pharmacy. Should follow up with urology as scheduled.  - tamsulosin (FLOMAX) 0.4 MG CAPS capsule; Take 1 capsule (0.4 mg total) by mouth daily.  Dispense: 90 capsule; Refill: 3  2. Atopic dermatitis, unspecified type May continue using triamcinolone cream twice daily as needed. Refills provided today.  - triamcinolone cream (KENALOG) 0.1 %; Apply to affected area  twice a day as needed  Dispense: 60 g; Refill: 3  General Counseling: Treylan verbalizes understanding of the findings of today's phone visit and agrees with plan of treatment. I have discussed any further diagnostic evaluation that may be needed or ordered today. We also reviewed his medications today. he has been encouraged to call the office with any questions or concerns that should arise related to todays visit.  This patient was seen by Vincent Gros FNP Collaboration with Dr Lyndon Code as a part of collaborative care agreement  Meds ordered this encounter  Medications  . tamsulosin (FLOMAX) 0.4 MG CAPS capsule    Sig: Take 1 capsule (0.4 mg total) by mouth daily.    Dispense:  90 capsule    Refill:  3    Patient needs appointment for future refills    Order Specific Question:   Supervising Provider    Answer:   Lyndon Code [[id number]]  .  triamcinolone cream (KENALOG) 0.1 %    Sig: Apply to affected area twice a day as needed    Dispense:  60 g    Refill:  3    Order Specific Question:   Supervising Provider    Answer:   Lyndon Code [[id number]]    Time spent: 23 Minutes    Dr Lyndon Code Internal medicine

## 2019-01-08 ENCOUNTER — Ambulatory Visit: Payer: Medicare HMO | Admitting: Urology

## 2019-01-22 ENCOUNTER — Other Ambulatory Visit: Payer: Self-pay

## 2019-01-22 DIAGNOSIS — R972 Elevated prostate specific antigen [PSA]: Secondary | ICD-10-CM

## 2019-01-23 ENCOUNTER — Other Ambulatory Visit: Payer: Medicare HMO

## 2019-01-23 ENCOUNTER — Other Ambulatory Visit: Payer: Self-pay

## 2019-01-23 DIAGNOSIS — N401 Enlarged prostate with lower urinary tract symptoms: Secondary | ICD-10-CM | POA: Diagnosis not present

## 2019-01-23 DIAGNOSIS — N138 Other obstructive and reflux uropathy: Secondary | ICD-10-CM | POA: Diagnosis not present

## 2019-01-23 DIAGNOSIS — R972 Elevated prostate specific antigen [PSA]: Secondary | ICD-10-CM | POA: Diagnosis not present

## 2019-01-23 LAB — URINALYSIS, COMPLETE
Bilirubin, UA: NEGATIVE
Glucose, UA: NEGATIVE
Ketones, UA: NEGATIVE
Leukocytes,UA: NEGATIVE
Nitrite, UA: NEGATIVE
Protein,UA: NEGATIVE
Specific Gravity, UA: 1.015 (ref 1.005–1.030)
Urobilinogen, Ur: 0.2 mg/dL (ref 0.2–1.0)
pH, UA: 7 (ref 5.0–7.5)

## 2019-01-23 LAB — MICROSCOPIC EXAMINATION: Bacteria, UA: NONE SEEN

## 2019-01-24 LAB — PSA: Prostate Specific Ag, Serum: 5.8 ng/mL — ABNORMAL HIGH (ref 0.0–4.0)

## 2019-01-29 ENCOUNTER — Encounter: Payer: Self-pay | Admitting: Urology

## 2019-01-29 ENCOUNTER — Other Ambulatory Visit: Payer: Self-pay

## 2019-01-29 ENCOUNTER — Ambulatory Visit: Payer: Medicare HMO | Admitting: Urology

## 2019-01-29 VITALS — BP 143/74 | HR 76 | Ht 73.0 in | Wt 247.0 lb

## 2019-01-29 DIAGNOSIS — N401 Enlarged prostate with lower urinary tract symptoms: Secondary | ICD-10-CM | POA: Diagnosis not present

## 2019-01-29 DIAGNOSIS — K5909 Other constipation: Secondary | ICD-10-CM | POA: Diagnosis not present

## 2019-01-29 DIAGNOSIS — R972 Elevated prostate specific antigen [PSA]: Secondary | ICD-10-CM

## 2019-01-29 DIAGNOSIS — R339 Retention of urine, unspecified: Secondary | ICD-10-CM | POA: Diagnosis not present

## 2019-01-29 DIAGNOSIS — N138 Other obstructive and reflux uropathy: Secondary | ICD-10-CM | POA: Diagnosis not present

## 2019-01-29 LAB — URINALYSIS, COMPLETE
Bilirubin, UA: NEGATIVE
Glucose, UA: NEGATIVE
Ketones, UA: NEGATIVE
Leukocytes,UA: NEGATIVE
Nitrite, UA: NEGATIVE
Protein,UA: NEGATIVE
Specific Gravity, UA: 1.01 (ref 1.005–1.030)
Urobilinogen, Ur: 0.2 mg/dL (ref 0.2–1.0)
pH, UA: 7 (ref 5.0–7.5)

## 2019-01-29 LAB — BLADDER SCAN AMB NON-IMAGING

## 2019-01-29 LAB — MICROSCOPIC EXAMINATION
Bacteria, UA: NONE SEEN
Epithelial Cells (non renal): NONE SEEN /hpf (ref 0–10)
WBC, UA: NONE SEEN /hpf (ref 0–5)

## 2019-01-29 NOTE — Progress Notes (Signed)
01/29/2019 3:22 PM   Casey Arias [birth year] [id number]  Referring provider: Carlean Jews, NP [address] [place],  [place] [address]  Chief Complaint  Patient presents with  . Elevated PSA    HPI: 73 year old male who returns today for annual follow-up.  He is a personal history of elevated PSA.  He initially presented to our office back in 2015 with a PSA of 6.6.  He never underwent prostate biopsy.  More recently, his PSA had risen to 5.5 on 10/2017.  This was rechecked and stable at 5.4.  He returns today again with repeat PSA 1 year later.  His PSA drawn on 01/23/2019 was 5.8.  Rectal exam previously noted to be markedly enlarged.  Distant family history of prostate cancer in grandfather.  He has been on chronic Flomax for several years.  Pleased with his urinary symptoms.  IPSS as below.  No significant complaints today.  PVR 150 cc.  He did note today that when voiding to provide a urine sample, he got out as much urine as possible but was a little bit shy about it.  He does mention today that he has been struggling a bit with constipation.  He took a laxative about 2 days ago and had a small BM but is not regular.  IPSS    Row Name 01/29/19 1500         International Prostate Symptom Score   How often have you had the sensation of not emptying your bladder?  Less than half the time     How often have you had to urinate less than every two hours?  Less than half the time     How often have you found you stopped and started again several times when you urinated?  Not at All     How often have you found it difficult to postpone urination?  Not at All     How often have you had a weak urinary stream?  Less than half the time     How often have you had to strain to start urination?  Less than half the time     How many times did you typically get up at night to urinate?  2 Times     Total IPSS Score  10       Quality of Life due to urinary symptoms   If you  were to spend the rest of your life with your urinary condition just the way it is now how would you feel about that?  Pleased        Score:  1-7 Mild 8-19 Moderate 20-35 Severe    PMH: Past Medical History:  Diagnosis Date  . COPD (chronic obstructive pulmonary disease) (HCC)   . Hyperlipidemia     Surgical History: No past surgical history on file.  Home Medications:  Allergies as of 01/29/2019      Reactions   Bee Pollen Anaphylaxis   Ciprofloxacin Rash      Medication List       Accurate as of January 29, 2019  3:22 PM. If you have any questions, ask your nurse or doctor.        albuterol 108 (90 Base) MCG/ACT inhaler Commonly known as: Ventolin HFA Inhale 2 puffs every 4 to 6 hrs as needed for sob   amoxicillin-clavulanate 875-125 MG tablet Commonly known as: AUGMENTIN Take 1 tablet by mouth 2 (two) times daily.   EPINEPHrine 0.3 mg/0.3 mL Soaj injection  Commonly known as: EpiPen 2-Pak Use as directed  as needed for anaphylaxis   tamsulosin 0.4 MG Caps capsule Commonly known as: FLOMAX Take 1 capsule (0.4 mg total) by mouth daily.   triamcinolone cream 0.1 % Commonly known as: KENALOG Apply to affected area twice a day as needed       Allergies:  Allergies  Allergen Reactions  . Bee Pollen Anaphylaxis  . Ciprofloxacin Rash    Family History: Family History  Problem Relation Age of Onset  . Lymphoma Father   . Bladder Cancer Mother   . Prostate cancer Paternal Grandfather   . Kidney cancer Neg Hx     Social History:  reports that he has quit smoking. He has never used smokeless tobacco. He reports that he does not drink alcohol or use drugs.  ROS: UROLOGY Frequent Urination?: Yes Hard to postpone urination?: Yes Burning/pain with urination?: No Get up at night to urinate?: No Leakage of urine?: No Urine stream starts and stops?: No Trouble starting stream?: No Do you have to strain to urinate?: No Blood in urine?: No Urinary tract  infection?: No Sexually transmitted disease?: No Injury to kidneys or bladder?: No Painful intercourse?: No Weak stream?: No Erection problems?: No Penile pain?: No  Gastrointestinal Nausea?: No Vomiting?: No Indigestion/heartburn?: No Diarrhea?: No Constipation?: No  Constitutional Fever: No Night sweats?: No Weight loss?: No Fatigue?: No  Skin Skin rash/lesions?: No Itching?: No  Eyes Blurred vision?: No Double vision?: No  Ears/Nose/Throat Sore throat?: No Sinus problems?: No  Hematologic/Lymphatic Swollen glands?: No Easy bruising?: No  Cardiovascular Leg swelling?: No Chest pain?: No  Respiratory Cough?: No Shortness of breath?: No  Endocrine Excessive thirst?: No  Musculoskeletal Back pain?: No Joint pain?: No  Neurological Headaches?: No Dizziness?: No  Psychologic Depression?: No Anxiety?: No  Physical Exam: BP (!) 143/74   Pulse 76   Ht 6\' 1"  (1.854 m)   Wt 247 lb (112 kg)   BMI 32.59 kg/m   Constitutional:  Alert and oriented, No acute distress. HEENT: [place] AT, moist mucus membranes.  Trachea midline, no masses. Cardiovascular: No clubbing, cyanosis, or edema. Respiratory: Normal respiratory effort, no increased work of breathing. GI: Abdomen is soft, nontender, nondistended, no abdominal masses Rectal: Normal sphincter tone.  Massive prostate, nontender no nodules. Skin: No rashes, bruises or suspicious lesions. Neurologic: Grossly intact, no focal deficits, moving all 4 extremities. Psychiatric: Normal mood and affect.  Laboratory Data: Lab Results  Component Value Date   WBC 9.0 11/06/2017   HGB 15.0 11/06/2017   HCT 44.4 11/06/2017   MCV 87 11/06/2017   PLT 302 11/06/2017    Lab Results  Component Value Date   CREATININE 0.88 11/06/2017    PSA as above  Urinalysis UA today reviewed, negative  Pertinent Imaging: Results for orders placed or performed in visit on 01/29/19  Bladder Scan (Post Void Residual) in  office  Result Value Ref Range   Scan Result 150ml     Assessment & Plan:    1. Elevated PSA Personal history of elevated PSA Most recent PSA is only slightly higher than the previous year Given the size of his gland, this PSA may be appropriate Its been as high as 6.6 in the remote past We will continue to follow him annually to which she is agreeable Rectal exam today is unremarkable other than for prostamegaly  2. Benign prostatic hyperplasia with urinary obstruction Continue Flomax Symptoms stable - Urinalysis, Complete - Bladder Scan (Post Void  Residual) in office - PSA; Future  3. Incomplete bladder emptying Mildly elevated PVR to 150 today, otherwise asymptomatic We will repeat P VR next time, this may be artifactual as the patient reports he did not void normally today  4. Chronic constipation Recommend daily stool softener   Return in about 1 year (around 01/29/2020) for IPSS/ PVR/ PSA.  Hollice Espy, MD  [facility] [address], [place], [place] [phone]

## 2019-02-11 ENCOUNTER — Ambulatory Visit (INDEPENDENT_AMBULATORY_CARE_PROVIDER_SITE_OTHER): Payer: Medicare HMO | Admitting: Nurse Practitioner

## 2019-02-11 ENCOUNTER — Encounter: Payer: Self-pay | Admitting: Nurse Practitioner

## 2019-02-11 ENCOUNTER — Other Ambulatory Visit: Payer: Self-pay

## 2019-02-11 VITALS — HR 81 | Temp 98.1°F | Ht 73.0 in | Wt 245.0 lb

## 2019-02-11 DIAGNOSIS — Z0001 Encounter for general adult medical examination with abnormal findings: Secondary | ICD-10-CM

## 2019-02-11 DIAGNOSIS — N400 Enlarged prostate without lower urinary tract symptoms: Secondary | ICD-10-CM | POA: Diagnosis not present

## 2019-02-11 DIAGNOSIS — J4520 Mild intermittent asthma, uncomplicated: Secondary | ICD-10-CM | POA: Diagnosis not present

## 2019-02-11 DIAGNOSIS — L209 Atopic dermatitis, unspecified: Secondary | ICD-10-CM | POA: Diagnosis not present

## 2019-02-11 MED ORDER — ALBUTEROL SULFATE HFA 108 (90 BASE) MCG/ACT IN AERS: INHALATION_SPRAY | RESPIRATORY_TRACT | 5 refills | Status: DC

## 2019-02-11 MED ORDER — TRIAMCINOLONE ACETONIDE 0.1 % EX CREA: TOPICAL_CREAM | CUTANEOUS | 3 refills | Status: DC

## 2019-02-11 MED ORDER — TAMSULOSIN HCL 0.4 MG PO CAPS: 0.4000 mg | ORAL_CAPSULE | Freq: Every day | ORAL | 3 refills | Status: DC

## 2019-02-11 NOTE — Progress Notes (Signed)
[facility] [place], [place] [address]  Internal MEDICINE  Telephone Visit  Patient Name: Casey Arias  [id number]  [id number]  Date of Service: 02/12/2019  I connected with the patient at 12:35pm by telephone and verified the patients identity using two identifiers.   I discussed the limitations, risks, security and privacy concerns of performing an evaluation and management service by telephone and the availability of in person appointments. I also discussed with the patient that there may be a patient responsible charge related to the service.  The patient expressed understanding and agrees to proceed.    Chief Complaint  Patient presents with  . Telephone Assessment  . Telephone Screen  . Annual Exam  . Hyperlipidemia    The patient has been contacted via telephone for follow up visit due to concerns for spread of novel coronavirus. He presents for health maintenance exam. He states that he has been doing well with breathing/COPD. Rarely having to use rescue inhaler. He is unable to monitor his blood pressure at home, however, has been well controlled. He would like to wait to have labs drawn until spread of COVID 19 has declined. He states that he feels well and has no concerns or complaints today.       Current Medication: Outpatient Encounter Medications as of 02/11/2019  Medication Sig  . albuterol (VENTOLIN HFA) 108 (90 Base) MCG/ACT inhaler Inhale 2 puffs every 4 to 6 hrs as needed for sob  . EPINEPHrine (EPIPEN 2-PAK) 0.3 mg/0.3 mL IJ SOAJ injection Use as directed  as needed for anaphylaxis  . tamsulosin (FLOMAX) 0.4 MG CAPS capsule Take 1 capsule (0.4 mg total) by mouth daily.  Marland Kitchen triamcinolone cream (KENALOG) 0.1 % Apply to affected area twice a day as needed  . [DISCONTINUED] albuterol (VENTOLIN HFA) 108 (90 Base) MCG/ACT inhaler Inhale 2 puffs every 4 to 6 hrs as needed for sob  . [DISCONTINUED] tamsulosin (FLOMAX) 0.4 MG CAPS capsule  Take 1 capsule (0.4 mg total) by mouth daily.  . [DISCONTINUED] triamcinolone cream (KENALOG) 0.1 % Apply to affected area twice a day as needed  . [DISCONTINUED] amoxicillin-clavulanate (AUGMENTIN) 875-125 MG tablet Take 1 tablet by mouth 2 (two) times daily. (Patient not taking: Reported on 11/05/2018)   No facility-administered encounter medications on file as of 02/11/2019.     Surgical History: History reviewed. No pertinent surgical history.  Medical History: Past Medical History:  Diagnosis Date  . COPD (chronic obstructive pulmonary disease) ([place])   . Hyperlipidemia     Family History: Family History  Problem Relation Age of Onset  . Lymphoma Father   . Bladder Cancer Mother   . Prostate cancer Paternal Grandfather   . Kidney cancer Neg Hx     Social History   Socioeconomic History  . Marital status: Married    Spouse name: Not on file  . Number of children: Not on file  . Years of education: Not on file  . Highest education level: Not on file  Occupational History  . Not on file  Social Needs  . Financial resource strain: Not on file  . Food insecurity    Worry: Not on file    Inability: Not on file  . Transportation needs    Medical: Not on file    Non-medical: Not on file  Tobacco Use  . Smoking status: Former Smoker    Types: Cigarettes  . Smokeless tobacco: Never Used  . Tobacco comment: quit 43yrs ago  Substance and Sexual Activity  . Alcohol use: Never    Frequency: Never  . Drug use: Never  . Sexual activity: Not on file  Lifestyle  . Physical activity    Days per week: Not on file    Minutes per session: Not on file  . Stress: Not on file  Relationships  . Social Musicianconnections    Talks on phone: Not on file    Gets together: Not on file    Attends religious service: Not on file    Active member of club or organization: Not on file    Attends meetings of clubs or organizations: Not on file    Relationship status: Not on file  . Intimate  partner violence    Fear of current or ex partner: Not on file    Emotionally abused: Not on file    Physically abused: Not on file    Forced sexual activity: Not on file  Other Topics Concern  . Not on file  Social History Narrative  . Not on file      Review of Systems  Constitutional: Negative for chills, fatigue and unexpected weight change.  HENT: Negative for congestion, rhinorrhea, sneezing and sore throat.   Respiratory: Negative for cough, chest tightness, shortness of breath and wheezing.        Intermittent wheezing. Rarely having to use rescue inhaler.   Cardiovascular: Negative for chest pain and palpitations.  Gastrointestinal: Negative for abdominal pain, constipation, diarrhea, nausea and vomiting.  Endocrine: Negative for cold intolerance, heat intolerance, polydipsia and polyuria.  Genitourinary:       PSA monitored per urology .  Musculoskeletal: Negative for arthralgias, back pain, joint swelling and neck pain.  Skin: Negative for rash.       Has intermittent rash/eczema  Allergic/Immunologic: Positive for environmental allergies.  Neurological: Negative for tremors, seizures and numbness.  Hematological: Negative for adenopathy. Does not bruise/bleed easily.  Psychiatric/Behavioral: Negative for behavioral problems, sleep disturbance and suicidal ideas. The patient is not nervous/anxious.     Today's Vitals   02/11/19 1053  Pulse: 81  Temp: 98.1 F (36.7 C)  Weight: 245 lb (111.1 kg)  Height: 6\' 1"  (1.854 m)   Body mass index is 32.32 kg/m.  Observation/Objective:  The patient is alert and oriented. He is pleasant and answering all questions appropriately. Breathing is non-labored. He is in no acute distress.   [facility] 02/11/2019 10/01/2018 05/06/2018 10/26/2017  Decreased Interest 0 0 0 0  Down, Depressed, Hopeless 0 0 0 0  PHQ - 2 Score 0 0 0 0    Functional Status Survey: Is the patient deaf or have difficulty hearing?:  No Does the patient have difficulty seeing, even when wearing glasses/contacts?: No Does the patient have difficulty concentrating, remembering, or making decisions?: No Does the patient have difficulty walking or climbing stairs?: No Does the patient have difficulty dressing or bathing?: No Does the patient have difficulty doing errands alone such as visiting a doctor's office or shopping?: No  MMSE - Mini Mental State Exam 10/26/2017  Orientation to time 5  Orientation to Place 5  Registration 3  Attention/ Calculation 5  Recall 3  Language- name 2 objects 2  Language- repeat 1  Language- follow 3 step command 3  Language- read & follow direction 1  Write a sentence 1  Copy design 1  Total score 30    Fall Risk  02/11/2019 10/01/2018 05/06/2018 10/26/2017  Falls in the past year?  1 0 0 No  Number falls in past yr: 0 - 0 -  Injury with Fall? 0 - 0 -    Assessment/Plan: 1. Encounter for general adult medical examination with abnormal findings Annual health maintenance exam today  2. Mild intermittent asthma without complication Use rescue inhaler as needed and as prescribed. New prescription sent to pharmacy today.  - albuterol (VENTOLIN HFA) 108 (90 Base) MCG/ACT inhaler; Inhale 2 puffs every 4 to 6 hrs as needed for sob  Dispense: 18 g; Refill: 5  3. Benign prostatic hyperplasia without lower urinary tract symptoms Continue flomax as prescribed. Regular visits with urology as scheduled - tamsulosin (FLOMAX) 0.4 MG CAPS capsule; Take 1 capsule (0.4 mg total) by mouth daily.  Dispense: 90 capsule; Refill: 3  4. Atopic dermatitis, unspecified type - triamcinolone cream (KENALOG) 0.1 %; Apply to affected area twice a day as needed  Dispense: 60 g; Refill: 3  General Counseling: Helix verbalizes understanding of the findings of today's phone visit and agrees with plan of treatment. I have discussed any further diagnostic evaluation that may be needed or ordered today. We also  reviewed his medications today. he has been encouraged to call the office with any questions or concerns that should arise related to todays visit.   Meds ordered this encounter  Medications  . albuterol (VENTOLIN HFA) 108 (90 Base) MCG/ACT inhaler    Sig: Inhale 2 puffs every 4 to 6 hrs as needed for sob    Dispense:  18 g    Refill:  5    Order Specific Question:   Supervising Provider    Answer:   Lyndon Code [[id number]]  . tamsulosin (FLOMAX) 0.4 MG CAPS capsule    Sig: Take 1 capsule (0.4 mg total) by mouth daily.    Dispense:  90 capsule    Refill:  3    Patient needs appointment for future refills    Order Specific Question:   Supervising Provider    Answer:   Lyndon Code [[id number]]  . triamcinolone cream (KENALOG) 0.1 %    Sig: Apply to affected area twice a day as needed    Dispense:  60 g    Refill:  3    Order Specific Question:   Supervising Provider    Answer:   Lyndon Code [[id number]]   This patient was seen by Vincent Gros FNP Collaboration with Dr Lyndon Code as a part of collaborative care agreement   Time spent: 25 Minutes    Dr Lyndon Code Internal medicine

## 2019-02-12 DIAGNOSIS — J4520 Mild intermittent asthma, uncomplicated: Secondary | ICD-10-CM | POA: Insufficient documentation

## 2019-03-25 ENCOUNTER — Other Ambulatory Visit: Payer: Self-pay | Admitting: Urology

## 2019-03-25 DIAGNOSIS — R3129 Other microscopic hematuria: Secondary | ICD-10-CM

## 2019-03-25 NOTE — Progress Notes (Unsigned)
Please schedule Casey Arias for a CTU report and a cystoscopy for microscopic hematuria.

## 2019-04-07 ENCOUNTER — Other Ambulatory Visit: Payer: Self-pay

## 2019-04-07 ENCOUNTER — Ambulatory Visit
Admission: RE | Admit: 2019-04-07 | Discharge: 2019-04-07 | Disposition: A | Discharge status: Home / Self Care | Payer: Medicare HMO | Source: Ambulatory Visit | Attending: Urology | Admitting: Urology

## 2019-04-07 DIAGNOSIS — R3129 Other microscopic hematuria: Secondary | ICD-10-CM | POA: Insufficient documentation

## 2019-04-07 DIAGNOSIS — K5730 Diverticulosis of large intestine without perforation or abscess without bleeding: Secondary | ICD-10-CM | POA: Diagnosis not present

## 2019-04-07 DIAGNOSIS — N289 Disorder of kidney and ureter, unspecified: Secondary | ICD-10-CM | POA: Diagnosis not present

## 2019-04-07 LAB — POCT I-STAT CREATININE: Creatinine, Ser: 0.9 mg/dL (ref 0.61–1.24)

## 2019-04-07 MED ORDER — IOHEXOL 300 MG/ML  SOLN
125.0000 mL | Freq: Once | INTRAMUSCULAR | Status: AC | PRN
  Administered 2019-04-07: 125 mL via INTRAVENOUS

## 2019-04-10 ENCOUNTER — Telehealth: Payer: Self-pay | Admitting: Urology

## 2019-04-10 NOTE — Telephone Encounter (Signed)
Pt called and wants to know if his upcoming appt can be changed to Virtual, he is nervous coming to Hospital right now due to [place].

## 2019-04-10 NOTE — Telephone Encounter (Signed)
Left VM to return call 

## 2019-04-16 ENCOUNTER — Other Ambulatory Visit: Payer: Medicare HMO | Admitting: Urology

## 2019-04-17 ENCOUNTER — Telehealth: Payer: Self-pay | Admitting: Urology

## 2019-04-17 ENCOUNTER — Other Ambulatory Visit: Payer: Medicare HMO | Admitting: Urology

## 2019-04-17 ENCOUNTER — Encounter: Payer: Self-pay | Admitting: Urology

## 2019-04-17 NOTE — Progress Notes (Deleted)
   04/17/19  CC: No chief complaint on file.   HPI:  There were no vitals taken for this visit. NED. A&Ox3.   No respiratory distress   Abd soft, NT, ND Normal phallus with bilateral descended testicles  Cystoscopy Procedure Note  Patient identification was confirmed, informed consent was obtained, and patient was prepped using Betadine solution.  Lidocaine jelly was administered per urethral meatus.     Pre-Procedure: - Inspection reveals a normal caliber ureteral meatus.  Procedure: The flexible cystoscope was introduced without difficulty - No urethral strictures/lesions are present. - {Blank multiple:19197::"Enlarged","Surgically absent","Normal"} prostate *** - {Blank multiple:19197::"Normal","Elevated","Tight"} bladder neck - Bilateral ureteral orifices identified - Bladder mucosa  reveals no ulcers, tumors, or lesions - No bladder stones - No trabeculation  Retroflexion shows ***   Post-Procedure: - Patient tolerated the procedure well  Assessment/ Plan:   No follow-ups on file.  Hollice Espy, MD

## 2019-04-17 NOTE — Telephone Encounter (Signed)
Pt was a no show for cysto today.  Just F.Y.I. 

## 2019-04-17 NOTE — Telephone Encounter (Signed)
Recommend making a virtual appointment with Casey Arias since she initially saw the patient to discuss CT results.  Hollice Espy, MD

## 2019-04-17 NOTE — Telephone Encounter (Signed)
Please schedule virtual with shannon  thanks

## 2019-04-17 NOTE — Telephone Encounter (Signed)
Patient was very adamant about not rescheduling due to his concerns in Covid increase numbers. Explained importance to keep follow up. He wanted to be rescheduled for one month for cysto first morning appointment. He would like to have a virtual to discuss CT results. Please advise.

## 2019-04-17 NOTE — Telephone Encounter (Signed)
Please call him to reschedule.  I see a note that he called the office requesting a virtual appointment, unfortunately cystoscopy is not something that can be done virtually.  Hollice Espy, MD

## 2019-04-18 NOTE — Telephone Encounter (Signed)
LMOM to call back to schedule Virtual appt with [facility].

## 2019-04-23 NOTE — Telephone Encounter (Signed)
PATIENT CALLED BACK AND SCHEDULED HIS VIRTUAL APP   MICHELLE

## 2019-04-27 NOTE — Progress Notes (Signed)
Virtual Visit via Telephone Note  I connected with Casey Arias on 04/27/19 at 11:30 AM EST by telephone and verified that I am speaking with the correct person using two identifiers.  Location: Patient: Home  Provider: Office    I discussed the limitations, risks, security and privacy concerns of performing an evaluation and management service by telephone and the availability of in person appointments. I also discussed with the patient that there may be a patient responsible charge related to this service. The patient expressed understanding and agreed to proceed.   History of Present Illness: [name] is a 73 year old male with a history of an elevated PSA, BPH with LU TS, incomplete bladder emptying and microscopic hematuria.  His most recent PSA was 5.8 in 12/2018.  He is to have an annual follow up.  He was found to have 3-10 RBC's on a random and underwent a CTU for further evaluation.  CT Urogram on 04/07/2019 revealed small low-attenuation nonenhancing lesions in the right kidney, compatible with simple cysts. No suspicious renal lesions.  Severe prostatomegaly with median lobe hypertrophy.  Small pulmonary nodules in the visualized lung bases bilaterally measuring up to 4 mm. These are nonspecific, but statistically likely benign. No follow-up needed if patient is low-risk (and has no known or suspected primary neoplasm). Non-contrast chest CT can be considered in 12 months if patient is high-risk. This recommendation follows the consensus statement: Guidelines for Management of Incidental Pulmonary Nodules Detected on CT Images: From the Fleischner Society 2017; Radiology 2017; 284:228-243.  Colonic diverticulosis without evidence of acute diverticulitis at this time.  He denies any episodes of gross hematuria.  He is still experiencing episodes of incomplete bladder emptying, intermittency and a weak urinary stream.  Patient denies any dysuria or suprapubic/flank pain.  Patient  denies any fevers, chills, nausea or vomiting.   He was wondering if there was anything stronger than the tamsulosin for his urinary symptoms.     Observations/Objective: CLINICAL DATA:  73 year old male with history of microscopic hematuria. Intermittent sensation of bladder pressure.  EXAM: CT ABDOMEN AND PELVIS WITHOUT AND WITH CONTRAST  TECHNIQUE: Multidetector CT imaging of the abdomen and pelvis was performed following the standard protocol before and following the bolus administration of intravenous contrast.  CONTRAST:  12mL OMNIPAQUE IOHEXOL 300 MG/ML  SOLN  COMPARISON:  No priors.  FINDINGS: Lower chest: Multiple small pulmonary nodules are noted throughout the visualized lung bases bilaterally measuring up to 4 mm (axial image 4 of series 3).  Hepatobiliary: Multiple subcentimeter low-attenuation lesions scattered throughout the liver, too small to characterize, but statistically likely to represent tiny cysts. No other larger more suspicious appearing hepatic lesions. No intra or extrahepatic biliary ductal dilatation. Gallbladder is normal in appearance.  Pancreas: No pancreatic mass. No pancreatic ductal dilatation. No pancreatic or peripancreatic fluid collections or inflammatory changes.  Spleen: Unremarkable.  Adrenals/Urinary Tract: No calculi identified within the collecting system of either kidney, along the course of either ureter, or within the lumen of the urinary bladder. No hydroureteronephrosis. In the right kidney there are two 1.4 cm low-attenuation nonenhancing lesions in the interpolar and upper polar regions, compatible with simple cysts. Left kidney and bilateral adrenal glands are normal in appearance. On postcontrast delayed images there are no definite filling defects within the collecting system of either kidney, along the course of either ureter, or within the lumen of the urinary bladder to strongly suggest the presence  of urothelial neoplasm at this time. Urinary  bladder is normal in appearance. Bilateral adrenal glands are normal in appearance.  Stomach/Bowel: Normal appearance of the stomach. No pathologic dilatation of small bowel or colon. Numerous colonic diverticulae are noted, particularly in the sigmoid colon, without surrounding inflammatory changes to suggest an acute diverticulitis at this time. Normal appendix.  Vascular/Lymphatic: Aortic atherosclerosis, without evidence of aneurysm or dissection in the abdominal or pelvic vasculature. No lymphadenopathy noted in the abdomen or pelvis.  Reproductive: Prostate gland is enlarged with median lobe hypertrophy measuring 7.3 x 6.4 x 8.1 cm. Seminal vesicles are unremarkable in appearance.  Other: No significant volume of ascites.  No pneumoperitoneum.  Musculoskeletal: There are no aggressive appearing lytic or blastic lesions noted in the visualized portions of the skeleton.  IMPRESSION: 1. No explanation for the patient's history of hematuria. Specifically, no urinary tract calculi no findings of urinary tract obstruction. 2. Small low-attenuation nonenhancing lesions in the right kidney, compatible with simple cysts. No suspicious renal lesions. 3. Severe prostatomegaly with median lobe hypertrophy. 4. Small pulmonary nodules in the visualized lung bases bilaterally measuring up to 4 mm. These are nonspecific, but statistically likely benign. No follow-up needed if patient is low-risk (and has no known or suspected primary neoplasm). Non-contrast chest CT can be considered in 12 months if patient is high-risk. This recommendation follows the consensus statement: Guidelines for Management of Incidental Pulmonary Nodules Detected on CT Images: From the Fleischner Society 2017; Radiology 2017; 284:228-243. 5. Colonic diverticulosis without evidence of acute diverticulitis at this time.   Electronically Signed   By: Trudie Reed M.D.   On: 04/07/2019 09:20 I have independently reviewed the films and prostate size ~ 200 cc   Assessment and Plan:  Microscopic hematuria - no findings on recent CTU to explain [facility] - patient encouraged to keep appointment for cystoscopy on 05/20/2019 - he stated he is really concerned regarding the COVID numbers coming into the flu season and may push back this appointment if he feels uncomfortable  Elevated PSA/BPH with LU TS/incomplete bladder empyting - suggested that he may want to discuss surgical options for his outlet issues as the tamsulosin is becoming less effective - and also to keep appointment on 01/22/2020 and 02/03/2020  Lung nodules - Chest CT in one year - PCP, Carlean Jews NP, notified by message regarding his CT findings and patient is advised to have a follow up CT of his chest in one year   Follow Up Instructions:  Keep appointment for cystoscopy or notify us if he wishes to defer due to the COVID pandemic     I discussed the assessment and treatment plan with the patient. The patient was provided an opportunity to ask questions and all were answered. The patient agreed with the plan and demonstrated an understanding of the instructions.   The patient was advised to call back or seek an in-person evaluation if the symptoms worsen or if the condition fails to improve as anticipated.  I provided 20 minutes of non-face-to-face time during this encounter.   Marsheila Alejo, PA-C

## 2019-04-28 ENCOUNTER — Telehealth (INDEPENDENT_AMBULATORY_CARE_PROVIDER_SITE_OTHER): Payer: Medicare HMO | Admitting: Urology

## 2019-04-28 ENCOUNTER — Other Ambulatory Visit: Payer: Self-pay

## 2019-04-28 DIAGNOSIS — R3129 Other microscopic hematuria: Secondary | ICD-10-CM | POA: Diagnosis not present

## 2019-04-28 DIAGNOSIS — N138 Other obstructive and reflux uropathy: Secondary | ICD-10-CM

## 2019-04-28 DIAGNOSIS — R918 Other nonspecific abnormal finding of lung field: Secondary | ICD-10-CM

## 2019-04-28 DIAGNOSIS — R339 Retention of urine, unspecified: Secondary | ICD-10-CM

## 2019-04-28 DIAGNOSIS — R972 Elevated prostate specific antigen [PSA]: Secondary | ICD-10-CM

## 2019-04-30 ENCOUNTER — Telehealth: Payer: Self-pay

## 2019-05-01 ENCOUNTER — Ambulatory Visit (INDEPENDENT_AMBULATORY_CARE_PROVIDER_SITE_OTHER): Payer: Medicare HMO | Admitting: Nurse Practitioner

## 2019-05-01 ENCOUNTER — Encounter: Payer: Self-pay | Admitting: Nurse Practitioner

## 2019-05-01 ENCOUNTER — Other Ambulatory Visit: Payer: Self-pay

## 2019-05-01 VITALS — Temp 99.0°F | Ht 73.0 in | Wt 245.0 lb

## 2019-05-01 DIAGNOSIS — K047 Periapical abscess without sinus: Secondary | ICD-10-CM | POA: Diagnosis not present

## 2019-05-01 MED ORDER — AMOXICILLIN-POT CLAVULANATE 875-125 MG PO TABS: 1.0000 | ORAL_TABLET | Freq: Two times a day (BID) | ORAL | 0 refills | Status: DC

## 2019-05-01 MED ORDER — CHLORHEXIDINE GLUCONATE 0.12 % MT SOLN: 15.0000 mL | Freq: Two times a day (BID) | OROMUCOSAL | 2 refills | Status: DC

## 2019-05-01 NOTE — Telephone Encounter (Signed)
Patient schd telephone visit for abx rx request. Casey Arias

## 2019-05-01 NOTE — Progress Notes (Signed)
[facility] [place], [place] [address]  Internal MEDICINE  Telephone Visit  Patient Name: Casey Arias  [id number]  [id number]  Date of Service: 05/01/2019  I connected with the patient at 8:58am by telephone and verified the patients identity using two identifiers.   I discussed the limitations, risks, security and privacy concerns of performing an evaluation and management service by telephone and the availability of in person appointments. I also discussed with the patient that there may be a patient responsible charge related to the service.  The patient expressed understanding and agrees to proceed.    Chief Complaint  Patient presents with  . Telephone Assessment  . Telephone Screen  . Hyperlipidemia    The patient has been contacted via telephone for follow up visit due to concerns for spread of novel coronavirus. Today, the patient is complaining of dental abscesses and lesions on the gums. Has been gargling with warm salt walter and Listerine about every hour for past few days. Lesions just seem to be getting worse. This has been issue for him in the past was successfully treated with augmentin. States he has not initiated care with dentis as this is expense he cannot afford right now. He denies fever or chills. Denies congestion, sore throat, nausea, or vomiting. He states that he feels good, otherwise.       Current Medication: Outpatient Encounter Medications as of 05/01/2019  Medication Sig  . albuterol (VENTOLIN HFA) 108 (90 Base) MCG/ACT inhaler Inhale 2 puffs every 4 to 6 hrs as needed for sob  . EPINEPHrine (EPIPEN 2-PAK) 0.3 mg/0.3 mL IJ SOAJ injection Use as directed  as needed for anaphylaxis  . tamsulosin (FLOMAX) 0.4 MG CAPS capsule Take 1 capsule (0.4 mg total) by mouth daily.  Marland Kitchen triamcinolone cream (KENALOG) 0.1 % Apply to affected area twice a day as needed  . amoxicillin-clavulanate (AUGMENTIN) 875-125 MG tablet Take 1 tablet by  mouth 2 (two) times daily.  . chlorhexidine (PERIDEX) 0.12 % solution Use as directed 15 mLs in the mouth or throat 2 (two) times daily.   No facility-administered encounter medications on file as of 05/01/2019.     Surgical History: History reviewed. No pertinent surgical history.  Medical History: Past Medical History:  Diagnosis Date  . COPD (chronic obstructive pulmonary disease) ([place])   . Hyperlipidemia     Family History: Family History  Problem Relation Age of Onset  . Lymphoma Father   . Bladder Cancer Mother   . Prostate cancer Paternal Grandfather   . Kidney cancer Neg Hx     Social History   Socioeconomic History  . Marital status: Married    Spouse name: Not on file  . Number of children: Not on file  . Years of education: Not on file  . Highest education level: Not on file  Occupational History  . Not on file  Social Needs  . Financial resource strain: Not on file  . Food insecurity    Worry: Not on file    Inability: Not on file  . Transportation needs    Medical: Not on file    Non-medical: Not on file  Tobacco Use  . Smoking status: Former Smoker    Types: Cigarettes  . Smokeless tobacco: Never Used  . Tobacco comment: quit 53yrs ago  Substance and Sexual Activity  . Alcohol use: Never    Frequency: Never  . Drug use: Never  . Sexual activity: Not on file  Lifestyle  .  Physical activity    Days per week: Not on file    Minutes per session: Not on file  . Stress: Not on file  Relationships  . Social Musician on phone: Not on file    Gets together: Not on file    Attends religious service: Not on file    Active member of club or organization: Not on file    Attends meetings of clubs or organizations: Not on file    Relationship status: Not on file  . Intimate partner violence    Fear of current or ex partner: Not on file    Emotionally abused: Not on file    Physically abused: Not on file    Forced sexual activity: Not on  file  Other Topics Concern  . Not on file  Social History Narrative  . Not on file      Review of Systems  Constitutional: Negative for chills, fatigue, fever and unexpected weight change.  HENT: Positive for dental problem and mouth sores. Negative for congestion, rhinorrhea, sneezing and sore throat.   Respiratory: Negative for cough, chest tightness, shortness of breath and wheezing.   Cardiovascular: Negative for chest pain and palpitations.  Gastrointestinal: Negative for abdominal pain, constipation, diarrhea, nausea and vomiting.  Endocrine: Negative for cold intolerance, heat intolerance, polydipsia and polyuria.  Genitourinary:       PSA monitored per urology .  Musculoskeletal: Negative for arthralgias, back pain, joint swelling and neck pain.  Skin: Negative for rash.       Has intermittent rash/eczema  Allergic/Immunologic: Positive for environmental allergies.  Neurological: Negative for tremors, seizures and numbness.  Hematological: Negative for adenopathy. Does not bruise/bleed easily.  Psychiatric/Behavioral: Negative for behavioral problems, sleep disturbance and suicidal ideas. The patient is not nervous/anxious.     Today's Vitals   05/01/19 0849  Temp: 99 F (37.2 C)  Weight: 245 lb (111.1 kg)  Height: 6\' 1"  (1.854 m)   Body mass index is 32.32 kg/m.  Observation/Objective:  The patient is alert and oriented. He is pleasant and answering all questions appropriately. Breathing is non-labored. He is in no acute distress.    Assessment/Plan: 1. Dental abscess Start augmentin 875mg  bid dfor 10 days. Swish and spit with chlorhexidine mouth wash twice daily. Encouraged him to consult dentist if problem persists or worsens.  - amoxicillin-clavulanate (AUGMENTIN) 875-125 MG tablet; Take 1 tablet by mouth 2 (two) times daily.  Dispense: 20 tablet; Refill: 0 - chlorhexidine (PERIDEX) 0.12 % solution; Use as directed 15 mLs in the mouth or throat 2 (two)  times daily.  Dispense: 120 mL; Refill: 2  General Counseling: Casey Arias verbalizes understanding of the findings of today's phone visit and agrees with plan of treatment. I have discussed any further diagnostic evaluation that may be needed or ordered today. We also reviewed his medications today. he has been encouraged to call the office with any questions or concerns that should arise related to todays visit.  This patient was seen by Vincent Gros FNP Collaboration with Dr Lyndon Code as a part of collaborative care agreement  Meds ordered this encounter  Medications  . amoxicillin-clavulanate (AUGMENTIN) 875-125 MG tablet    Sig: Take 1 tablet by mouth 2 (two) times daily.    Dispense:  20 tablet    Refill:  0    Order Specific Question:   Supervising Provider    Answer:   Lyndon Code [[id number]]  . chlorhexidine (PERIDEX) 0.12 %  solution    Sig: Use as directed 15 mLs in the mouth or throat 2 (two) times daily.    Dispense:  120 mL    Refill:  2    Order Specific Question:   Supervising Provider    Answer:   Lyndon Code [[id number]]    Time spent: 6 Minutes    Dr Lyndon Code Internal medicine

## 2019-05-20 ENCOUNTER — Other Ambulatory Visit: Payer: Self-pay | Admitting: Urology

## 2019-06-26 ENCOUNTER — Telehealth: Payer: Self-pay | Admitting: Urology

## 2019-06-26 NOTE — Telephone Encounter (Signed)
Pt rescheduled his CYSTO appt to mid Feb. Pt states he has COPD and does not want to chance getting out until his has gotten his vaccine. Pt has been rescheduled to Feb and will call and reschedule if that date isn't a good date for him in corresponding his 2 vaccine dates.   FYI

## 2019-06-30 ENCOUNTER — Other Ambulatory Visit: Payer: Self-pay

## 2019-06-30 DIAGNOSIS — K047 Periapical abscess without sinus: Secondary | ICD-10-CM

## 2019-06-30 MED ORDER — CHLORHEXIDINE GLUCONATE 0.12 % MT SOLN: 15.0000 mL | Freq: Two times a day (BID) | OROMUCOSAL | 2 refills | Status: DC

## 2019-07-01 ENCOUNTER — Other Ambulatory Visit: Payer: Medicare HMO | Admitting: Urology

## 2019-07-24 ENCOUNTER — Ambulatory Visit: Payer: Medicare Other

## 2019-08-01 ENCOUNTER — Ambulatory Visit: Payer: Medicare Other | Attending: Internal Medicine

## 2019-08-01 DIAGNOSIS — Z23 Encounter for immunization: Secondary | ICD-10-CM | POA: Insufficient documentation

## 2019-08-01 NOTE — Progress Notes (Signed)
   Covid-19 Vaccination Clinic  Name:  Casey Arias    MRN: [id number] DOB: 1945-08-11  08/01/2019  [name] was observed post Covid-19 immunization for 30 minutes based on pre-vaccination screening without incidence. He was provided with Vaccine Information Sheet and instruction to access the V-Safe system.   [name] was instructed to call 911 with any severe reactions post vaccine: Marland Kitchen Difficulty breathing  . Swelling of your face and throat  . A fast heartbeat  . A bad rash all over your body  . Dizziness and weakness    Immunizations Administered    Name Date Dose VIS Date Route   Pfizer COVID-19 Vaccine 08/01/2019  8:43 AM 0.3 mL 06/06/2019 Intramuscular   Manufacturer: [employer], [employer]   Lot: [id number]   NDC: [id number]

## 2019-08-12 ENCOUNTER — Other Ambulatory Visit: Payer: Medicare HMO | Admitting: Urology

## 2019-08-13 ENCOUNTER — Telehealth: Payer: Self-pay

## 2019-08-13 NOTE — Telephone Encounter (Signed)
LMOM TO CONFIRM 08-15-19 OV AS VIRTUAL.

## 2019-08-13 NOTE — Telephone Encounter (Signed)
Confirmed virtual visit on 08/15/2019. klh

## 2019-08-15 ENCOUNTER — Encounter: Payer: Self-pay | Admitting: Nurse Practitioner

## 2019-08-15 ENCOUNTER — Ambulatory Visit (INDEPENDENT_AMBULATORY_CARE_PROVIDER_SITE_OTHER): Payer: Medicare HMO | Admitting: Nurse Practitioner

## 2019-08-15 VITALS — HR 79 | Temp 98.7°F | Ht 72.0 in | Wt 240.0 lb

## 2019-08-15 DIAGNOSIS — J4520 Mild intermittent asthma, uncomplicated: Secondary | ICD-10-CM

## 2019-08-15 DIAGNOSIS — K047 Periapical abscess without sinus: Secondary | ICD-10-CM

## 2019-08-15 DIAGNOSIS — N400 Enlarged prostate without lower urinary tract symptoms: Secondary | ICD-10-CM | POA: Diagnosis not present

## 2019-08-15 MED ORDER — CHLORHEXIDINE GLUCONATE 0.12 % MT SOLN: 15.0000 mL | Freq: Two times a day (BID) | OROMUCOSAL | 3 refills | Status: DC

## 2019-08-15 MED ORDER — AMOXICILLIN-POT CLAVULANATE 875-125 MG PO TABS: 1.0000 | ORAL_TABLET | Freq: Two times a day (BID) | ORAL | 0 refills | Status: DC

## 2019-08-15 NOTE — Progress Notes (Signed)
[facility] [address] [place], [place] [address]  Internal MEDICINE  Telephone Visit  Patient Name: Casey Arias  [id number]  [id number]  Date of Service: 08/15/2019  I connected with the patient at 11:45am by telephone and verified the patients identity using two identifiers.   I discussed the limitations, risks, security and privacy concerns of performing an evaluation and management service by telephone and the availability of in person appointments. I also discussed with the patient that there may be a patient responsible charge related to the service.  The patient expressed understanding and agrees to proceed.    Chief Complaint  Patient presents with  . Telephone Assessment  . Telephone Screen    The patient has been contacted via telephone for follow up visit due to concerns for spread of novel coronavirus. He presents for routine visit. The patient is complaining of dental abscesses and lesions on the gums. He has been treated for this in the past using augmentin and Peridex mouth rinse. He does have a dentist, but states that he has been avoiding visits due to COVID 19. He did receive his first COVID vaccine on 08/01/2019. He is scheduled for the 2nd dose on 08/26/2019. He received the Pfizer vaccine through [facility] vaccination clinic. He states that he did get some arm soreness. States that his nose ran for a few hours. He had no other problems or negative effects from the vaccine.       Current Medication: Outpatient Encounter Medications as of 08/15/2019  Medication Sig  . albuterol (VENTOLIN HFA) 108 (90 Base) MCG/ACT inhaler Inhale 2 puffs every 4 to 6 hrs as needed for sob  . chlorhexidine (PERIDEX) 0.12 % solution Use as directed 15 mLs in the mouth or throat 2 (two) times daily.  Marland Kitchen EPINEPHrine (EPIPEN 2-PAK) 0.3 mg/0.3 mL IJ SOAJ injection Use as directed  as needed for anaphylaxis  . tamsulosin (FLOMAX) 0.4 MG CAPS capsule Take 1 capsule (0.4 mg  total) by mouth daily.  Marland Kitchen triamcinolone cream (KENALOG) 0.1 % Apply to affected area twice a day as needed  . [DISCONTINUED] chlorhexidine (PERIDEX) 0.12 % solution Use as directed 15 mLs in the mouth or throat 2 (two) times daily.  Marland Kitchen amoxicillin-clavulanate (AUGMENTIN) 875-125 MG tablet Take 1 tablet by mouth 2 (two) times daily.  . [DISCONTINUED] amoxicillin-clavulanate (AUGMENTIN) 875-125 MG tablet Take 1 tablet by mouth 2 (two) times daily. (Patient not taking: Reported on 08/15/2019)   No facility-administered encounter medications on file as of 08/15/2019.    Surgical History: History reviewed. No pertinent surgical history.  Medical History: Past Medical History:  Diagnosis Date  . COPD (chronic obstructive pulmonary disease) ([facility])   . Hyperlipidemia     Family History: Family History  Problem Relation Age of Onset  . Lymphoma Father   . Bladder Cancer Mother   . Prostate cancer Paternal Grandfather   . Kidney cancer Neg Hx     Social History   Socioeconomic History  . Marital status: Married    Spouse name: Not on file  . Number of children: Not on file  . Years of education: Not on file  . Highest education level: Not on file  Occupational History  . Not on file  Tobacco Use  . Smoking status: Former Smoker    Types: Cigarettes  . Smokeless tobacco: Never Used  . Tobacco comment: quit 30yrs ago  Substance and Sexual Activity  . Alcohol use: Never  . Drug use: Never  .  Sexual activity: Not on file  Other Topics Concern  . Not on file  Social History Narrative  . Not on file   Social Determinants of Health   Financial Resource Strain:   . Difficulty of Paying Living Expenses: Not on file  Food Insecurity:   . Worried About Charity fundraiser in the Last Year: Not on file  . Ran Out of Food in the Last Year: Not on file  Transportation Needs:   . Lack of Transportation (Medical): Not on file  . Lack of Transportation (Non-Medical): Not on file   Physical Activity:   . Days of Exercise per Week: Not on file  . Minutes of Exercise per Session: Not on file  Stress:   . Feeling of Stress : Not on file  Social Connections:   . Frequency of Communication with Friends and Family: Not on file  . Frequency of Social Gatherings with Friends and Family: Not on file  . Attends Religious Services: Not on file  . Active Member of Clubs or Organizations: Not on file  . Attends Archivist Meetings: Not on file  . Marital Status: Not on file  Intimate Partner Violence:   . Fear of Current or Ex-Partner: Not on file  . Emotionally Abused: Not on file  . Physically Abused: Not on file  . Sexually Abused: Not on file      Review of Systems  Constitutional: Negative for activity change, chills, fatigue, fever and unexpected weight change.  HENT: Positive for dental problem and mouth sores. Negative for congestion, rhinorrhea, sneezing and sore throat.   Respiratory: Positive for shortness of breath and wheezing. Negative for cough and chest tightness.        History of COPD   Cardiovascular: Negative for chest pain and palpitations.  Gastrointestinal: Negative for abdominal pain, constipation, diarrhea, nausea and vomiting.  Endocrine: Negative for cold intolerance, heat intolerance, polydipsia and polyuria.  Genitourinary:       PSA monitored per urology .  Musculoskeletal: Negative for arthralgias, back pain, joint swelling and neck pain.  Skin: Negative for rash.       Has intermittent rash/eczema  Allergic/Immunologic: Positive for environmental allergies.  Neurological: Negative for tremors, seizures and numbness.  Hematological: Negative for adenopathy. Does not bruise/bleed easily.  Psychiatric/Behavioral: Negative for behavioral problems, sleep disturbance and suicidal ideas. The patient is not nervous/anxious.     Today's Vitals   08/15/19 1133  Pulse: 79  Temp: 98.7 F (37.1 C)  Weight: 240 lb (108.9 kg)   Height: 6' (1.829 m)   Body mass index is 32.55 kg/m.  Observation/Objective:  The patient is alert and oriented. He is pleasant and answering all questions appropriately. Breathing is non-labored. He is in no acute distress.    Assessment/Plan:  1. Dental abscess Repeat augmentin 875mg  bid for 10 days. Renew chlorhexidine mouth rinse. Swish and spit with 16mls twice daily as needed. Recommend he follow up with dentist as soon as possible.  - chlorhexidine (PERIDEX) 0.12 % solution; Use as directed 15 mLs in the mouth or throat 2 (two) times daily.  Dispense: 120 mL; Refill: 3 - amoxicillin-clavulanate (AUGMENTIN) 875-125 MG tablet; Take 1 tablet by mouth 2 (two) times daily.  Dispense: 20 tablet; Refill: 0  2. Mild intermittent asthma without complication Stable. Continue to monitor.  3. Benign prostatic hyperplasia without lower urinary tract symptoms Regular visits with urology as scheduled.   General Counseling: aizik reh understanding of the findings of today's  phone visit and agrees with plan of treatment. I have discussed any further diagnostic evaluation that may be needed or ordered today. We also reviewed his medications today. he has been encouraged to call the office with any questions or concerns that should arise related to todays visit.  This patient was seen by Vincent Gros FNP Collaboration with Dr Lyndon Code as a part of collaborative care agreement  Meds ordered this encounter  Medications  . chlorhexidine (PERIDEX) 0.12 % solution    Sig: Use as directed 15 mLs in the mouth or throat 2 (two) times daily.    Dispense:  120 mL    Refill:  3    Order Specific Question:   Supervising Provider    Answer:   Lyndon Code [[id number]]  . amoxicillin-clavulanate (AUGMENTIN) 875-125 MG tablet    Sig: Take 1 tablet by mouth 2 (two) times daily.    Dispense:  20 tablet    Refill:  0    Order Specific Question:   Supervising Provider    Answer:   Lyndon Code  [[id number]]    Time spent: 33 Minutes    Dr Lyndon Code Internal medicine

## 2019-08-26 ENCOUNTER — Ambulatory Visit: Payer: Medicare Other | Attending: Internal Medicine

## 2019-08-26 DIAGNOSIS — Z23 Encounter for immunization: Secondary | ICD-10-CM | POA: Insufficient documentation

## 2019-08-26 NOTE — Progress Notes (Signed)
   Covid-19 Vaccination Clinic  Name:  Casey Arias    MRN: [id number] DOB: 08-01-1945  08/26/2019  Casey Arias was observed post Covid-19 immunization for 30 minutes based on pre-vaccination screening without incident. He was provided with Vaccine Information Sheet and instruction to access the V-Safe system.   Casey Arias was instructed to call 911 with any severe reactions post vaccine: Marland Kitchen Difficulty breathing  . Swelling of face and throat  . A fast heartbeat  . A bad rash all over body  . Dizziness and weakness   Immunizations Administered    Name Date Dose VIS Date Route   Pfizer COVID-19 Vaccine 08/26/2019  8:53 AM 0.3 mL 06/06/2019 Intramuscular   Manufacturer: [employer], [employer]   Lot: [id number]   NDC: [address]-9

## 2019-09-09 ENCOUNTER — Other Ambulatory Visit: Payer: Self-pay

## 2019-09-09 ENCOUNTER — Encounter: Payer: Self-pay | Admitting: Urology

## 2019-09-09 ENCOUNTER — Ambulatory Visit: Payer: Medicare HMO | Admitting: Urology

## 2019-09-09 VITALS — BP 142/86 | HR 78 | Ht 72.0 in | Wt 248.0 lb

## 2019-09-09 DIAGNOSIS — R3129 Other microscopic hematuria: Secondary | ICD-10-CM | POA: Diagnosis not present

## 2019-09-09 DIAGNOSIS — N401 Enlarged prostate with lower urinary tract symptoms: Secondary | ICD-10-CM

## 2019-09-09 DIAGNOSIS — N138 Other obstructive and reflux uropathy: Secondary | ICD-10-CM

## 2019-09-09 LAB — URINALYSIS, COMPLETE
Bilirubin, UA: NEGATIVE
Glucose, UA: NEGATIVE
Ketones, UA: NEGATIVE
Leukocytes,UA: NEGATIVE
Nitrite, UA: NEGATIVE
Protein,UA: NEGATIVE
Specific Gravity, UA: 1.01 (ref 1.005–1.030)
Urobilinogen, Ur: 0.2 mg/dL (ref 0.2–1.0)
pH, UA: 6 (ref 5.0–7.5)

## 2019-09-09 LAB — MICROSCOPIC EXAMINATION: Bacteria, UA: NONE SEEN

## 2019-09-09 NOTE — Progress Notes (Signed)
   09/09/19  CC:  Chief Complaint  Patient presents with  . Cysto    HPI: 74 year old male with microscopic hematuria BPH with LUTS/incomplete bladder emptying as well as elevated PSA he returns today for cystoscopy.  He is delayed cystoscopy today in light of COVID-19 pandemic but is now fully immunized.  He underwent CT urogram in 05/2019 which showed some low attenuation enhancing renal lesions most compatible with cyst as well as BPH with median lobe component.  No other GU pathology was identified.  He did have an incidental pulmonary nodules statistically benign.  Prostate measures 7.3 x 6.4 x 8.1 cm. --> 198 cc (likely over estimation of size based on presence of median lobe)  He does continue have significant lower urinary tract symptoms primarily obstructive.  Blood pressure (!) 142/86, pulse 78, height 6' (1.829 m), weight 248 lb (112.5 kg). NED. A&Ox3.   No respiratory distress   Abd soft, NT, ND Normal phallus with bilateral descended testicles  Cystoscopy Procedure Note  Patient identification was confirmed, informed consent was obtained, and patient was prepped using Betadine solution.  Lidocaine jelly was administered per urethral meatus.     Pre-Procedure: - Inspection reveals a normal caliber ureteral meatus.  Procedure: The flexible cystoscope was introduced without difficulty - No urethral strictures/lesions are present. - Enlarged prostate significant trilobar coaptation and hypervascularity - Elevated bladder neck - Bilateral ureteral orifices identified - Bladder mucosa  reveals no ulcers, tumors, or lesions - No bladder stones -Mild/moderate trabeculation  Retroflexion shows well-circumscribed moderate median lobe  Small amount of bleeding at bladder neck appreciated with endoscopic manipulation   Post-Procedure: - Patient tolerated the procedure well  Assessment/ Plan:  1. Microscopic hematuria Cystoscopy today significant for prostamegaly  only, no concern for bladder cancer or any other malignancy  CT urogram as above - Urinalysis, Complete  2. Benign prostatic hyperplasia with urinary obstruction Refractory obstructive urinary symptoms in the setting of prostamegaly with median lobe component  We discussed the option of an outlet procedure based on cystoscopic findings today as well as refractory obstructive urinary symptoms.  He may be interested in pursuing this.  He was given information primarily today about holep but also TURP.  Based on the glans is alone, he would likely benefit from holep as his primary intervention.  We will have him follow-up via virtual visit to discuss this further once he is had time to consider his options.  Written information was provided today regarding each of these procedures.   Vanna Scotland, MD

## 2019-09-09 NOTE — Patient Instructions (Signed)
Holmium Laser Enucleation of the Prostate (HoLEP)  HoLEP is a treatment for men with benign prostatic hyperplasia (BPH). The laser surgery removed blockages of urine flow, and is done without any incisions on the body.     What is HoLEP?  HoLEP is a type of laser surgery used to treat obstruction (blockage) of urine flow as a result of benign prostatic hyperplasia (BPH). In men with BPH, the prostate gland is not cancerous, but has become enlarged. An enlarged prostate can result in a number of urinary tract symptoms such as weak urinary stream, difficulty in starting urination, inability to urinate, frequent urination, or getting up at night to urinate.  HoLEP was developed in the 1990's as a more effective and less expensive surgical option for BPH, compared to other surgical options such as laser vaporization(PVP/greenlight laser), transurethral resection of the prostate(TURP), and open simple prostatectomy.   What happens during a HoLEP?  HoLEP requires general anesthesia ("asleep" throughout the procedure).   An antibiotic is given to reduce the risk of infection  A surgical instrument called a resectoscope is inserted through the urethra (the tube that carries urine from the bladder). The resectoscope has a camera that allows the surgeon to view the internal structure of the prostate gland, and to see where the incisions are being made during surgery.  The laser is inserted into the resectoscope and is used to enucleate (free up) the enlarged prostate tissue from the capsule (outer shell) and then to seal up any blood vessels. The tissue that has been removed is pushed back into the bladder.  A morcellator is placed through the resectoscope, and is used to suction out the prostate tissue that has been pushed into the bladder.  When the prostate tissue has been removed, the resectoscope is removed, and a foley catheter is placed to allow healing and drain the urine from the  bladder.     What happens after a HoLEP?  More than 90% of patients go home the same day a few hours after surgery. Less than 10% will be admitted to the hospital overnight for observation to monitor the urine, or if they have other medical problems.  Fluid is flushed through the catheter for about 1 hour after surgery to clear any blood from the urine. It is normal to have some blood in the urine after surgery. The need for blood transfusion is extremely rare.  Eating and drinking are permitted after the procedure once the patient has fully awakened from anesthesia.  The catheter is usually removed 2-3 days after surgery- the patient will come to clinic to have the catheter removed and make sure they can urinate on their own.  It is very important to drink lots of fluids after surgery for one week to keep the bladder flushed.  At first, there may be some burning with urination, but this typically improved within a few hours to days. Most patients do not have a significant amount of pain, and narcotic pain medications are rarely needed.  Symptoms of urinary frequency, urgency, and even leakage are NORMAL for the first few weeks after surgery as the bladder adjusts after having to work hard against blockage from the prostate for many years. This will improve, but can sometimes take several months.  The use of pelvic floor exercises (Kegel exercises) can help improve problems with urinary incontinence.   After catheter removal, patients will be seen at 6 weeks and 6 months for symptom check  No heavy lifting for   at least 2-3 weeks after surgery, however patients can walk and do light activities the first day after surgery. Return to work time depends on occupation.    What are the advantages of HoLEP?  HoLEP has been studied in many different parts of the world and has been shown to be a safe and effective procedure. Although there are many types of BPH surgeries available, HoLEP offers a  unique advantage in being able to remove a large amount of tissue without any incisions on the body, even in very large prostates, while decreasing the risk of bleeding and providing tissue for pathology (to look for cancer). This decreases the need for blood transfusions during surgery, minimizes hospital stay, and reduces the risk of needing repeat treatment.  What are the side effects of HoLEP?  Temporary burning and bleeding during urination. Some blood may be seen in the urine for weeks after surgery and is part of the healing process.  Urinary incontinence (inability to control urine flow) is expected in all patients immediately after surgery and they should wear pads for the first few days/weeks. This typically improves over the course of several weeks. Performing Kegel exercises can help decrease leakage from stress maneuvers such as coughing, sneezing, or lifting. The rate of long term leakage is very low. Patients may also have leakage with urgency and this may be treated with medication. The risk of urge incontinence can be dependent on several factors including age, prostate size, symptoms, and other medical problems.  Retrograde ejaculation or "backwards ejaculation." In 75% of cases, the patient will not see any fluid during ejaculation after surgery.  Erectile function is generally not significantly affected.   What are the risks of HoLEP?  Injury to the urethra or development of scar tissue at a later date  Injury to the capsule of the prostate (typically treated with longer catheterization).  Injury to the bladder or ureteral orifices (where the urine from the kidney drains out)  Infection of the bladder, testes, or kidneys  Return of urinary obstruction at a later date requiring another operation (<2%)  Need for blood transfusion or re-operation due to bleeding  Failure to relieve all symptoms and/or need for prolonged catheterization after surgery  5-15% of patients are  found to have previously undiagnosed prostate cancer in their specimen. Prostate cancer can be treated after HoLEP.  Standard risks of anesthesia including blood clots, heart attacks, etc  When should I call my doctor?  Fever over 101.3 degrees  Inability to urinate, or large blood clots in the urine      Transurethral Resection of the Prostate Transurethral resection of the prostate (TURP) is the removal (resection) of part of the gland that produces semen (prostate gland). This procedure is done to treat benign prostatic hyperplasia (BPH). BPH is an abnormal, noncancerous (benign) increase in the number of cells that make up the prostate tissue. BPH causes the prostate to get bigger. The enlarged prostate can push against or block the tube that drains urine from the bladder out of the body (urethra). BPH can affect normal urine flow by causing bladder infections, difficulty controlling bladder function, and difficulty emptying the bladder.  The goal of TURP is to remove enough prostate tissue to allow for a normal flow of urine. The procedure will allow you to empty your bladder more completely when you urinate so that you can urinate less often. In a transurethral resection, a thin telescope with a light, a tiny camera, and an electric cutting   edge (resectoscope) is passed through the urethra and into the prostate. The opening of the urethra is at the end of the penis. Tell a health care provider about:  Any allergies you have.  All medicines you are taking, including vitamins, herbs, eye drops, creams, and over-the-counter medicines.  Any problems you or family members have had with anesthetic medicines.  Any blood disorders you have.  Any surgeries you have had.  Any medical conditions you have.  Any prostate infections you have had. What are the risks? Generally, this is a safe procedure. However, problems may occur, including:  Infection.  Bleeding.  Allergic reactions  to medicines.  Damage to other structures or organs, such as: ? The urethra. ? The bladder. ? Muscles that surround the prostate.  Difficulty getting an erection.  Inability to control when you urinate (incontinence).  Scarring, which may cause problems with urine flow. What happens before the procedure? Medicines Ask your health care provider about:  Changing or stopping your regular medicines. This is especially important if you are taking diabetes medicines or blood thinners.  Taking medicines such as aspirin and ibuprofen. These medicines can thin your blood. Do not take these medicines unless your health care provider tells you to take them.  Taking over-the-counter medicines, vitamins, herbs, and supplements. Eating and drinking Follow instructions from your health care provider about eating and drinking, which may include:  8 hours before the procedure - stop eating heavy meals or foods, such as meat, fried foods, or fatty foods.  6 hours before the procedure - stop eating light meals or foods, such as toast or cereal.  6 hours before the procedure - stop drinking milk or drinks that contain milk.  2 hours before the procedure - stop drinking clear liquids. Staying hydrated Follow instructions from your health care provider about hydration, which may include:  Up to 2 hours before the procedure - you may continue to drink clear liquids, such as water, clear fruit juice, black coffee, and plain tea. General instructions  You may have a physical exam.  You may have a blood or urine sample taken.  Ask your health care provider what steps will be taken to help prevent infection. These may include: ? Washing skin with a germ-killing soap. ? Taking antibiotic medicine.  Plan to have someone take you home from the hospital or clinic. You may not be able to drive for up to 10 days after your procedure.  Plan to have a responsible adult care for you for at least 24 hours  after you leave the hospital or clinic. This is important. What happens during the procedure?   An IV will be inserted into one of your veins.  You will be given one or more of the following: ? A medicine to help you relax (sedative). ? A medicine to make you fall asleep (general anesthetic). ? A medicine that is injected into your spine to numb the area below and slightly above the injection site (spinal anesthetic).  Your legs will be placed in foot rests (stirrups) so that your legs are apart and your knees are bent.  The resectoscope will be passed through your urethra to your prostate.  Parts of your prostate will be resected using the cutting edge of the resectoscope.  The resectoscope will be removed.  A small, thin tube (catheter) will be passed through your urethra and into your bladder. The catheter will drain urine into a bag outside of your body. ? Fluid   may be passed through the catheter to keep the catheter open. The procedure may vary among health care providers and hospitals. What happens after the procedure?  Your blood pressure, heart rate, breathing rate, and blood oxygen level will be monitored until you leave the hospital or clinic.  You may continue to receive fluids and medicines through an IV.  You may have some pain. Pain medicine will be available to help you.  You will have a catheter draining your urine. ? You may have blood in your urine. Your catheter may be kept in until your urine is clear. ? Your urinary drainage will be monitored. If necessary, your bladder may be rinsed out (irrigated) through your catheter.  You will be encouraged to walk around as soon as possible.  You may have to wear compression stockings. These stockings help prevent blood clots and reduce swelling in your legs.  Do not drive for 24 hours if you were given a sedative during your procedure. Summary  Transurethral resection of the prostate (TURP) is the removal  (resection) of part of the gland that produces semen (prostate gland).  The goal of this procedure is to remove enough prostate tissue to allow for a normal flow of urine.  Follow instructions from your health care provider about taking medicines and about eating and drinking before the procedure. This information is not intended to replace advice given to you by your health care provider. Make sure you discuss any questions you have with your health care provider. Document Revised: 10/02/2018 Document Reviewed: 03/13/2018 Elsevier Patient Education  2020 [employer]  

## 2019-09-30 ENCOUNTER — Other Ambulatory Visit: Payer: Self-pay

## 2019-09-30 ENCOUNTER — Telehealth (INDEPENDENT_AMBULATORY_CARE_PROVIDER_SITE_OTHER): Payer: Medicare HMO | Admitting: Urology

## 2019-09-30 DIAGNOSIS — R972 Elevated prostate specific antigen [PSA]: Secondary | ICD-10-CM | POA: Diagnosis not present

## 2019-09-30 DIAGNOSIS — N138 Other obstructive and reflux uropathy: Secondary | ICD-10-CM

## 2019-09-30 DIAGNOSIS — N401 Enlarged prostate with lower urinary tract symptoms: Secondary | ICD-10-CM | POA: Diagnosis not present

## 2019-09-30 NOTE — Progress Notes (Signed)
Virtual Visit via Telephone Note  I connected with Casey Arias on 09/30/19 at 4:00 PM EDT by telephone and verified that I am speaking with the correct person using two identifiers.  Location: Patient: home Provider: office   I discussed the limitations of evaluation and management by telemedicine and the availability of in person appointments. The patient expressed understanding and agreed to proceed.  History of Present Illness: Casey Arias is a 74 y.o. M who returns today via video call for the evaluation and management of BPH with urinary obstruction.   He underwent CT urogram in 05/2019 which showed some low attenuation enhancing renal lesions most compatible with cyst as well as BPH with median lobe component.  No other GU pathology was identified.  He did have an incidental pulmonary nodules statistically benign.  Cystoscopy on 09/09/18 significant for prostamegaly only with no concern for bladder cancer or any other malignancy.  Prostate measures 7.3 x 6.4 x 8.1 cm. --> 198 cc (likely over estimation of size based on presence of median lobe)  He continues to have significant lower urinary tract symptoms primarily obstructive. He is currently on Tamsulosin.   Observations/Objective: Pt is engaged and asking good questions.   Assessment and Plan:  1. BPH with urinary obstruction  Refractory obstructive urinary symptoms in the setting of prostamegaly with median lobe component  Discussed maximum medical management with addition of finasteride vs surgical intervention (simple vs robotic vs open) vs embolization.   We discussed alternatives including TURP vs. holmium laser enucleation of the prostate Differences between the surgical procedures were discussed as well as the risks and benefits of each.Based on the prostate size of >100 g, he would likely benefit from holep as his primary intervention. We reviewed the surgery in detail today including the preoperative,  intraoperative, and postoperative course.  This will most likely be an outpatient procedure pending the degree of post op hematuria.  He will go home with catheter for a 2 days post op and will either be taught how to remove his own catheter or return to the office for catheter removal.  Risk of bleeding, infection, damage surrounding structures, injury to the bladder/ urethral, bladder neck contracture, ureteral stricture, retrograde ejaculation, stress/ urge incontinence, exacerbation of irritative voiding symptoms were all discussed in detail.    He understands all of the above and is willing to proceed as planned. He would like to have his surgery in the middle of July after July 4th. Return for PSA couple weeks before surgery.   2. Hx of elevated PSA Secondary to above  Will check annual PSA prior to surgery     I discussed the limitations, risks, security and privacy concerns of performing an evaluation and management service by telephone and the availability of in person appointments. I also discussed with the patient that there may be a patient responsible charge related to this service. The patient expressed understanding and agreed to proceed.   The patient was advised to call back or seek an in-person evaluation if the symptoms worsen or if the condition fails to improve as anticipated.  I provided 30 minutes of non-face-to-face time during this encounter.  Solon Augusta, am acting as a scribe for Dr. Vanna Scotland,  I have reviewed the above documentation for accuracy and completeness, and I agree with the above.   Vanna Scotland, MD

## 2019-10-02 ENCOUNTER — Other Ambulatory Visit: Payer: Self-pay | Admitting: Radiology

## 2019-10-02 DIAGNOSIS — N138 Other obstructive and reflux uropathy: Secondary | ICD-10-CM

## 2019-12-22 ENCOUNTER — Telehealth: Payer: Self-pay | Admitting: Radiology

## 2019-12-22 NOTE — Telephone Encounter (Signed)
LMOM to return call to discuss upcoming surgery with Dr Apolinar Junes.

## 2019-12-25 ENCOUNTER — Other Ambulatory Visit: Payer: Self-pay

## 2019-12-25 DIAGNOSIS — N138 Other obstructive and reflux uropathy: Secondary | ICD-10-CM

## 2019-12-26 ENCOUNTER — Other Ambulatory Visit: Payer: Self-pay

## 2019-12-26 ENCOUNTER — Other Ambulatory Visit: Payer: Medicare HMO

## 2019-12-26 ENCOUNTER — Encounter
Admission: RE | Admit: 2019-12-26 | Discharge: 2019-12-26 | Disposition: A | Discharge status: Home / Self Care | Payer: Medicare HMO | Source: Ambulatory Visit | Attending: Urology | Admitting: Urology

## 2019-12-26 DIAGNOSIS — N401 Enlarged prostate with lower urinary tract symptoms: Secondary | ICD-10-CM | POA: Diagnosis not present

## 2019-12-26 DIAGNOSIS — N138 Other obstructive and reflux uropathy: Secondary | ICD-10-CM | POA: Diagnosis not present

## 2019-12-26 HISTORY — DX: Cardiac murmur, unspecified: R01.1

## 2019-12-26 LAB — URINALYSIS, COMPLETE
Bilirubin, UA: NEGATIVE
Glucose, UA: NEGATIVE
Ketones, UA: NEGATIVE
Leukocytes,UA: NEGATIVE
Nitrite, UA: NEGATIVE
Protein,UA: NEGATIVE
RBC, UA: NEGATIVE
Specific Gravity, UA: 1.01 (ref 1.005–1.030)
Urobilinogen, Ur: 0.2 mg/dL (ref 0.2–1.0)
pH, UA: 7 (ref 5.0–7.5)

## 2019-12-26 LAB — MICROSCOPIC EXAMINATION

## 2019-12-26 NOTE — Patient Instructions (Signed)
Your procedure is scheduled on: Monday 01/05/20.  Report to DAY SURGERY DEPARTMENT LOCATED ON 2ND FLOOR MEDICAL MALL ENTRANCE. To find out your arrival time please call [phone] between 1PM - 3PM on Friday 01/02/20.   Remember: Instructions that are not followed completely may result in serious medical risk, up to and including death, or upon the discretion of your surgeon and anesthesiologist your surgery may need to be rescheduled.      _X__ 1. Do not eat food after midnight the night before your procedure.                 No gum chewing or hard candies. You may drink clear liquids up to 2 hours                 before you are scheduled to arrive for your surgery- DO NOT drink clear                 liquids within 2 hours of the start of your surgery.                 Clear Liquids include:  water, apple juice without pulp, clear carbohydrate                 drink such as Clearfast or Gatorade, Black Coffee or Tea (Do not add                 anything to coffee or tea).   __X__2.  On the morning of surgery brush your teeth with toothpaste and water, you may rinse your mouth with mouthwash if you wish.  Do not swallow any toothpaste or mouthwash.       _X__ 3.  No Alcohol for 24 hours before or after surgery.     _X__ 4.  Do Not Smoke or use e-cigarettes For 24 Hours Prior to Your Surgery.                 Do not use any chewable tobacco products for at least 6 hours prior to                 surgery.   __X__5.  Notify your doctor if there is any change in your medical condition      (cold, fever, infections).      Do not wear jewelry, make-up, hairpins, clips or nail polish. Do not wear lotions, powders, or perfumes.  Do not shave 48 hours prior to surgery. Men may shave face and neck. Do not bring valuables to the hospital.     [facility] is not responsible for any belongings or valuables.    Contacts, dentures/partials or body piercings may not be worn into surgery.  Bring a case for your contacts, glasses or hearing aids, a denture cup will be supplied.    Patients discharged the day of surgery will not be allowed to drive home.     __X__ Take these medicines the morning of surgery with A SIP OF WATER:     1. albuterol (VENTOLIN HFA)     __ X__ Use inhalers on the day of surgery. Also bring the inhaler with you to the hospital on the morning of surgery.      __X__ Stop Anti-inflammatories 7 days before surgery such as Advil, Ibuprofen, Motrin, BC or Goodies Powder, Naprosyn, Naproxen, Aleve, Aspirin, Meloxicam. May take Tylenol if needed for pain or discomfort.

## 2019-12-27 LAB — PSA: Prostate Specific Ag, Serum: 7.8 ng/mL — ABNORMAL HIGH (ref 0.0–4.0)

## 2019-12-29 ENCOUNTER — Other Ambulatory Visit: Admission: RE | Admit: 2019-12-29 | Payer: Medicare Other | Source: Ambulatory Visit

## 2019-12-29 LAB — CULTURE, URINE COMPREHENSIVE

## 2019-12-30 ENCOUNTER — Encounter: Payer: Self-pay | Admitting: Adult Health

## 2019-12-30 ENCOUNTER — Other Ambulatory Visit: Payer: Self-pay

## 2019-12-30 ENCOUNTER — Ambulatory Visit (INDEPENDENT_AMBULATORY_CARE_PROVIDER_SITE_OTHER): Payer: Medicare HMO | Admitting: Adult Health

## 2019-12-30 DIAGNOSIS — K047 Periapical abscess without sinus: Secondary | ICD-10-CM

## 2019-12-30 DIAGNOSIS — J4520 Mild intermittent asthma, uncomplicated: Secondary | ICD-10-CM | POA: Diagnosis not present

## 2019-12-30 MED ORDER — CHLORHEXIDINE GLUCONATE 0.12 % MT SOLN: 15.0000 mL | Freq: Two times a day (BID) | OROMUCOSAL | 3 refills | Status: DC

## 2019-12-30 MED ORDER — AMOXICILLIN-POT CLAVULANATE 875-125 MG PO TABS: 1.0000 | ORAL_TABLET | Freq: Two times a day (BID) | ORAL | 0 refills | Status: DC

## 2019-12-30 MED ORDER — ALBUTEROL SULFATE HFA 108 (90 BASE) MCG/ACT IN AERS: INHALATION_SPRAY | RESPIRATORY_TRACT | 5 refills | Status: DC

## 2019-12-30 NOTE — Progress Notes (Signed)
[facility] [address] [place], [place] [address]  Internal MEDICINE  Office Visit Note  Patient Name: Casey Arias  [id number]  [id number]  Date of Service: 12/30/2019  Chief Complaint  Patient presents with  . Acute Visit    mouth infection  . Medication Refill     HPI Pt is here for a sick visit. Patient has multiple broken teeth on his lower jaw that are infected.  He has been intermittently taking antibiotics, trying to get into dentist.  He denies any fever at this time.     Current Medication:  Outpatient Encounter Medications as of 12/30/2019  Medication Sig  . albuterol (VENTOLIN HFA) 108 (90 Base) MCG/ACT inhaler Inhale 2 puffs every 4 to 6 hrs as needed for sob  . chlorhexidine (PERIDEX) 0.12 % solution Use as directed 15 mLs in the mouth or throat 2 (two) times daily.  Marland Kitchen EPINEPHrine (EPIPEN 2-PAK) 0.3 mg/0.3 mL IJ SOAJ injection Use as directed  as needed for anaphylaxis  . Multiple Vitamins-Minerals (MULTIVITAMIN WITH MINERALS) tablet Take 1 tablet by mouth daily.  . tamsulosin (FLOMAX) 0.4 MG CAPS capsule Take 1 capsule (0.4 mg total) by mouth daily.  Marland Kitchen triamcinolone cream (KENALOG) 0.1 % Apply to affected area twice a day as needed (Patient taking differently: Apply 1 application topically 2 (two) times daily as needed (irritation). Apply to affected area twice a day as needed)  . [DISCONTINUED] albuterol (VENTOLIN HFA) 108 (90 Base) MCG/ACT inhaler Inhale 2 puffs every 4 to 6 hrs as needed for sob  . [DISCONTINUED] chlorhexidine (PERIDEX) 0.12 % solution Use as directed 15 mLs in the mouth or throat 2 (two) times daily.  Marland Kitchen amoxicillin-clavulanate (AUGMENTIN) 875-125 MG tablet Take 1 tablet by mouth 2 (two) times daily.  . [DISCONTINUED] amoxicillin-clavulanate (AUGMENTIN) 875-125 MG tablet Take 1 tablet by mouth 2 (two) times daily. (Patient not taking: Reported on 12/15/2019)   No facility-administered encounter medications on file as of  12/30/2019.      Medical History: Past Medical History:  Diagnosis Date  . COPD (chronic obstructive pulmonary disease) (HCC)   . Heart murmur   . Hyperlipidemia      Vital Signs: BP 137/83   Pulse 60   Temp (!) 97.5 F (36.4 C)   Resp 16   Ht 6' (1.829 m)   Wt 247 lb 12.8 oz (112.4 kg)   SpO2 96%   BMI 33.61 kg/m    Review of Systems  Constitutional: Negative.  Negative for chills, fatigue and unexpected weight change.  HENT: Negative.  Negative for congestion, rhinorrhea, sneezing and sore throat.   Eyes: Negative for redness.  Respiratory: Negative.  Negative for cough, chest tightness and shortness of breath.   Cardiovascular: Negative.  Negative for chest pain and palpitations.  Gastrointestinal: Negative.  Negative for abdominal pain, constipation, diarrhea, nausea and vomiting.  Endocrine: Negative.   Genitourinary: Negative.  Negative for dysuria and frequency.  Musculoskeletal: Negative.  Negative for arthralgias, back pain, joint swelling and neck pain.  Skin: Negative.  Negative for rash.  Allergic/Immunologic: Negative.   Neurological: Negative.  Negative for tremors and numbness.  Hematological: Negative for adenopathy. Does not bruise/bleed easily.  Psychiatric/Behavioral: Negative.  Negative for behavioral problems, sleep disturbance and suicidal ideas. The patient is not nervous/anxious.     Physical Exam Vitals and nursing note reviewed.  Constitutional:      General: He is not in acute distress.    Appearance: He is well-developed. He is  not diaphoretic.  HENT:     Head: Normocephalic and atraumatic.     Mouth/Throat:     Pharynx: No oropharyngeal exudate.  Eyes:     Pupils: Pupils are equal, round, and reactive to light.  Neck:     Thyroid: No thyromegaly.     Vascular: No JVD.     Trachea: No tracheal deviation.  Cardiovascular:     Rate and Rhythm: Normal rate and regular rhythm.     Heart sounds: Normal heart sounds. No murmur heard.   No friction rub. No gallop.   Pulmonary:     Effort: Pulmonary effort is normal. No respiratory distress.     Breath sounds: Normal breath sounds. No wheezing or rales.  Chest:     Chest wall: No tenderness.  Abdominal:     Palpations: Abdomen is soft.     Tenderness: There is no abdominal tenderness. There is no guarding.  Musculoskeletal:        General: Normal range of motion.     Cervical back: Normal range of motion and neck supple.  Lymphadenopathy:     Cervical: No cervical adenopathy.  Skin:    General: Skin is warm and dry.  Neurological:     Mental Status: He is alert and oriented to person, place, and time.     Cranial Nerves: No cranial nerve deficit.  Psychiatric:        Behavior: Behavior normal.        Thought Content: Thought content normal.        Judgment: Judgment normal.    Assessment/Plan: 1. Dental abscess Advised patient to take entire course of antibiotics as prescribed with food. Pt should return to clinic in 7-10 days if symptoms fail to improve or new symptoms develop.  - chlorhexidine (PERIDEX) 0.12 % solution; Use as directed 15 mLs in the mouth or throat 2 (two) times daily.  Dispense: 120 mL; Refill: 3 - amoxicillin-clavulanate (AUGMENTIN) 875-125 MG tablet; Take 1 tablet by mouth 2 (two) times daily.  Dispense: 20 tablet; Refill: 0  2. Mild intermittent asthma without complication Refilled ventolin as before.  - albuterol (VENTOLIN HFA) 108 (90 Base) MCG/ACT inhaler; Inhale 2 puffs every 4 to 6 hrs as needed for sob  Dispense: 18 g; Refill: 5  General Counseling: Casey Arias verbalizes understanding of the findings of todays visit and agrees with plan of treatment. I have discussed any further diagnostic evaluation that may be needed or ordered today. We also reviewed his medications today. he has been encouraged to call the office with any questions or concerns that should arise related to todays visit.   No orders of the defined types were placed  in this encounter.   Meds ordered this encounter  Medications  . chlorhexidine (PERIDEX) 0.12 % solution    Sig: Use as directed 15 mLs in the mouth or throat 2 (two) times daily.    Dispense:  120 mL    Refill:  3  . amoxicillin-clavulanate (AUGMENTIN) 875-125 MG tablet    Sig: Take 1 tablet by mouth 2 (two) times daily.    Dispense:  20 tablet    Refill:  0  . albuterol (VENTOLIN HFA) 108 (90 Base) MCG/ACT inhaler    Sig: Inhale 2 puffs every 4 to 6 hrs as needed for sob    Dispense:  18 g    Refill:  5    Time spent: 25 Minutes  This patient was seen by Blima Ledger AGNP-C in Collaboration  with Dr Lyndon Code as a part of collaborative care agreement.  Johnna Acosta AGNP-C Internal Medicine

## 2020-01-01 ENCOUNTER — Other Ambulatory Visit: Payer: Self-pay

## 2020-01-01 ENCOUNTER — Other Ambulatory Visit
Admission: RE | Admit: 2020-01-01 | Discharge: 2020-01-01 | Disposition: A | Discharge status: Home / Self Care | Payer: Medicare HMO | Source: Ambulatory Visit | Attending: Urology | Admitting: Urology

## 2020-01-01 DIAGNOSIS — Z01812 Encounter for preprocedural laboratory examination: Secondary | ICD-10-CM | POA: Insufficient documentation

## 2020-01-01 DIAGNOSIS — Z20822 Contact with and (suspected) exposure to covid-19: Secondary | ICD-10-CM | POA: Insufficient documentation

## 2020-01-01 LAB — SARS CORONAVIRUS 2 (TAT 6-24 HRS): SARS Coronavirus 2: NEGATIVE

## 2020-01-02 ENCOUNTER — Encounter
Admission: RE | Admit: 2020-01-02 | Discharge: 2020-01-02 | Disposition: A | Discharge status: Home / Self Care | Payer: Medicare HMO | Source: Ambulatory Visit | Attending: Urology | Admitting: Urology

## 2020-01-02 DIAGNOSIS — Z01812 Encounter for preprocedural laboratory examination: Secondary | ICD-10-CM | POA: Diagnosis not present

## 2020-01-02 DIAGNOSIS — Z01810 Encounter for preprocedural cardiovascular examination: Secondary | ICD-10-CM | POA: Diagnosis not present

## 2020-01-05 ENCOUNTER — Ambulatory Visit: Payer: Medicare HMO | Admitting: Anesthesiology

## 2020-01-05 ENCOUNTER — Encounter
Admission: RE | Disposition: A | Discharge status: Home / Self Care | Payer: Self-pay | Source: Home / Self Care | Attending: Urology

## 2020-01-05 ENCOUNTER — Inpatient Hospital Stay
Admission: RE | Admit: 2020-01-05 | Discharge: 2020-01-08 | DRG: 714 | Disposition: A | Discharge status: Home / Self Care | Payer: Medicare HMO | Attending: Urology | Admitting: Urology

## 2020-01-05 ENCOUNTER — Other Ambulatory Visit: Payer: Self-pay

## 2020-01-05 ENCOUNTER — Encounter: Payer: Self-pay | Admitting: Urology

## 2020-01-05 DIAGNOSIS — Z807 Family history of other malignant neoplasms of lymphoid, hematopoietic and related tissues: Secondary | ICD-10-CM

## 2020-01-05 DIAGNOSIS — Z20822 Contact with and (suspected) exposure to covid-19: Secondary | ICD-10-CM | POA: Diagnosis present

## 2020-01-05 DIAGNOSIS — N138 Other obstructive and reflux uropathy: Secondary | ICD-10-CM | POA: Diagnosis present

## 2020-01-05 DIAGNOSIS — R310 Gross hematuria: Secondary | ICD-10-CM | POA: Diagnosis not present

## 2020-01-05 DIAGNOSIS — E785 Hyperlipidemia, unspecified: Secondary | ICD-10-CM | POA: Diagnosis present

## 2020-01-05 DIAGNOSIS — J449 Chronic obstructive pulmonary disease, unspecified: Secondary | ICD-10-CM | POA: Diagnosis present

## 2020-01-05 DIAGNOSIS — N3289 Other specified disorders of bladder: Secondary | ICD-10-CM | POA: Diagnosis present

## 2020-01-05 DIAGNOSIS — N320 Bladder-neck obstruction: Secondary | ICD-10-CM | POA: Diagnosis not present

## 2020-01-05 DIAGNOSIS — N401 Enlarged prostate with lower urinary tract symptoms: Secondary | ICD-10-CM | POA: Diagnosis not present

## 2020-01-05 DIAGNOSIS — Z87891 Personal history of nicotine dependence: Secondary | ICD-10-CM

## 2020-01-05 DIAGNOSIS — R0602 Shortness of breath: Secondary | ICD-10-CM | POA: Diagnosis not present

## 2020-01-05 DIAGNOSIS — T83091A Other mechanical complication of indwelling urethral catheter, initial encounter: Secondary | ICD-10-CM | POA: Diagnosis not present

## 2020-01-05 DIAGNOSIS — N400 Enlarged prostate without lower urinary tract symptoms: Secondary | ICD-10-CM

## 2020-01-05 DIAGNOSIS — Y731 Therapeutic (nonsurgical) and rehabilitative gastroenterology and urology devices associated with adverse incidents: Secondary | ICD-10-CM | POA: Diagnosis not present

## 2020-01-05 DIAGNOSIS — Z8052 Family history of malignant neoplasm of bladder: Secondary | ICD-10-CM

## 2020-01-05 DIAGNOSIS — Z8042 Family history of malignant neoplasm of prostate: Secondary | ICD-10-CM

## 2020-01-05 HISTORY — PX: HOLEP-LASER ENUCLEATION OF THE PROSTATE WITH MORCELLATION: SHX6641

## 2020-01-05 SURGERY — ENUCLEATION, PROSTATE, USING LASER, WITH MORCELLATION
Anesthesia: General | Site: Prostate

## 2020-01-05 MED ORDER — LACTATED RINGERS IV SOLN: INTRAVENOUS | Status: DC

## 2020-01-05 MED ORDER — HYDROCODONE-ACETAMINOPHEN 5-325 MG PO TABS: 1.0000 | ORAL_TABLET | Freq: Four times a day (QID) | ORAL | 0 refills | Status: DC | PRN

## 2020-01-05 MED ORDER — ROCURONIUM BROMIDE 10 MG/ML (PF) SYRINGE
PREFILLED_SYRINGE | INTRAVENOUS | Status: AC
  Filled 2020-01-05: qty 10

## 2020-01-05 MED ORDER — PROPOFOL 10 MG/ML IV BOLUS
INTRAVENOUS | Status: AC
  Filled 2020-01-05: qty 20

## 2020-01-05 MED ORDER — CHLORHEXIDINE GLUCONATE 0.12 % MT SOLN
OROMUCOSAL | Status: AC
  Administered 2020-01-05: 15 mL via OROMUCOSAL
  Filled 2020-01-05: qty 15

## 2020-01-05 MED ORDER — FENTANYL CITRATE (PF) 250 MCG/5ML IJ SOLN
INTRAMUSCULAR | Status: DC | PRN
  Administered 2020-01-05 (×2): 50 ug via INTRAVENOUS
  Administered 2020-01-05 (×2): 25 ug via INTRAVENOUS
  Administered 2020-01-05: 50 ug via INTRAVENOUS
  Administered 2020-01-05 (×2): 25 ug via INTRAVENOUS

## 2020-01-05 MED ORDER — BELLADONNA ALKALOIDS-OPIUM 16.2-30 MG RE SUPP
1.0000 | Freq: Once | RECTAL | Status: DC
  Filled 2020-01-05: qty 1

## 2020-01-05 MED ORDER — CHLORHEXIDINE GLUCONATE 0.12 % MT SOLN: 15.0000 mL | Freq: Once | OROMUCOSAL | Status: AC

## 2020-01-05 MED ORDER — FUROSEMIDE 10 MG/ML IJ SOLN: 10.0000 mg | Freq: Once | INTRAMUSCULAR | Status: AC

## 2020-01-05 MED ORDER — CHLORHEXIDINE GLUCONATE CLOTH 2 % EX PADS
6.0000 | MEDICATED_PAD | Freq: Every day | CUTANEOUS | Status: DC
  Administered 2020-01-06 – 2020-01-07 (×3): 6 via TOPICAL

## 2020-01-05 MED ORDER — FUROSEMIDE 10 MG/ML IJ SOLN
INTRAMUSCULAR | Status: AC
  Filled 2020-01-05: qty 4

## 2020-01-05 MED ORDER — ONDANSETRON HCL 4 MG/2ML IJ SOLN
INTRAMUSCULAR | Status: DC | PRN
  Administered 2020-01-05: 4 mg via INTRAVENOUS

## 2020-01-05 MED ORDER — MIDAZOLAM HCL 2 MG/2ML IJ SOLN
INTRAMUSCULAR | Status: DC | PRN
  Administered 2020-01-05: 2 mg via INTRAVENOUS

## 2020-01-05 MED ORDER — FAMOTIDINE 20 MG PO TABS: 20.0000 mg | ORAL_TABLET | Freq: Once | ORAL | Status: AC

## 2020-01-05 MED ORDER — ROCURONIUM BROMIDE 100 MG/10ML IV SOLN
INTRAVENOUS | Status: DC | PRN
  Administered 2020-01-05: 10 mg via INTRAVENOUS
  Administered 2020-01-05: 20 mg via INTRAVENOUS
  Administered 2020-01-05: 50 mg via INTRAVENOUS
  Administered 2020-01-05: 10 mg via INTRAVENOUS
  Administered 2020-01-05 (×2): 20 mg via INTRAVENOUS
  Administered 2020-01-05: 10 mg via INTRAVENOUS

## 2020-01-05 MED ORDER — HYDROCODONE-ACETAMINOPHEN 5-325 MG PO TABS: 1.0000 | ORAL_TABLET | Freq: Four times a day (QID) | ORAL | Status: DC | PRN

## 2020-01-05 MED ORDER — CEFAZOLIN SODIUM-DEXTROSE 2-4 GM/100ML-% IV SOLN
INTRAVENOUS | Status: AC
  Filled 2020-01-05: qty 100

## 2020-01-05 MED ORDER — PROPOFOL 10 MG/ML IV BOLUS
INTRAVENOUS | Status: DC | PRN
  Administered 2020-01-05: 180 mg via INTRAVENOUS

## 2020-01-05 MED ORDER — FENTANYL CITRATE (PF) 100 MCG/2ML IJ SOLN
INTRAMUSCULAR | Status: AC
  Filled 2020-01-05: qty 2

## 2020-01-05 MED ORDER — SUGAMMADEX SODIUM 200 MG/2ML IV SOLN
INTRAVENOUS | Status: DC | PRN
  Administered 2020-01-05: 200 mg via INTRAVENOUS

## 2020-01-05 MED ORDER — DEXAMETHASONE SODIUM PHOSPHATE 10 MG/ML IJ SOLN
INTRAMUSCULAR | Status: DC | PRN
  Administered 2020-01-05: 8 mg via INTRAVENOUS

## 2020-01-05 MED ORDER — LIDOCAINE HCL (PF) 2 % IJ SOLN
INTRAMUSCULAR | Status: AC
  Filled 2020-01-05: qty 5

## 2020-01-05 MED ORDER — FUROSEMIDE 10 MG/ML IJ SOLN
INTRAMUSCULAR | Status: AC
  Administered 2020-01-05: 10 mg via INTRAVENOUS
  Filled 2020-01-05: qty 2

## 2020-01-05 MED ORDER — OXYBUTYNIN CHLORIDE 5 MG PO TABS
5.0000 mg | ORAL_TABLET | Freq: Three times a day (TID) | ORAL | 0 refills | Status: DC | PRN
Start: 2020-01-05 — End: 2020-01-08

## 2020-01-05 MED ORDER — BELLADONNA ALKALOIDS-OPIUM 16.2-60 MG RE SUPP
RECTAL | Status: AC
  Administered 2020-01-05: 1
  Filled 2020-01-05: qty 1

## 2020-01-05 MED ORDER — BELLADONNA ALKALOIDS-OPIUM 16.2-60 MG RE SUPP
1.0000 | Freq: Three times a day (TID) | RECTAL | Status: DC | PRN
  Administered 2020-01-08: 1 via RECTAL
  Filled 2020-01-05: qty 1

## 2020-01-05 MED ORDER — DEXAMETHASONE SODIUM PHOSPHATE 10 MG/ML IJ SOLN
INTRAMUSCULAR | Status: AC
  Filled 2020-01-05: qty 1

## 2020-01-05 MED ORDER — MIDAZOLAM HCL 2 MG/2ML IJ SOLN
INTRAMUSCULAR | Status: AC
  Filled 2020-01-05: qty 2

## 2020-01-05 MED ORDER — FENTANYL CITRATE (PF) 100 MCG/2ML IJ SOLN
INTRAMUSCULAR | Status: AC
  Administered 2020-01-05: 25 ug via INTRAVENOUS
  Filled 2020-01-05: qty 2

## 2020-01-05 MED ORDER — ORAL CARE MOUTH RINSE: 15.0000 mL | Freq: Once | OROMUCOSAL | Status: AC

## 2020-01-05 MED ORDER — LIDOCAINE HCL (CARDIAC) PF 100 MG/5ML IV SOSY
PREFILLED_SYRINGE | INTRAVENOUS | Status: DC | PRN
  Administered 2020-01-05: 80 mg via INTRAVENOUS

## 2020-01-05 MED ORDER — FAMOTIDINE 20 MG PO TABS
ORAL_TABLET | ORAL | Status: AC
  Administered 2020-01-05: 20 mg via ORAL
  Filled 2020-01-05: qty 1

## 2020-01-05 MED ORDER — ONDANSETRON HCL 4 MG/2ML IJ SOLN: 4.0000 mg | Freq: Once | INTRAMUSCULAR | Status: DC | PRN

## 2020-01-05 MED ORDER — SODIUM CHLORIDE 0.9 % IR SOLN
3000.0000 mL | Status: DC
  Administered 2020-01-05: 3000 mL

## 2020-01-05 MED ORDER — FUROSEMIDE 10 MG/ML IJ SOLN
INTRAMUSCULAR | Status: DC | PRN
  Administered 2020-01-05: 10 mg via INTRAMUSCULAR

## 2020-01-05 MED ORDER — ONDANSETRON HCL 4 MG/2ML IJ SOLN
INTRAMUSCULAR | Status: AC
  Filled 2020-01-05: qty 2

## 2020-01-05 MED ORDER — FENTANYL CITRATE (PF) 100 MCG/2ML IJ SOLN
25.0000 ug | INTRAMUSCULAR | Status: DC | PRN
  Administered 2020-01-05 (×2): 25 ug via INTRAVENOUS

## 2020-01-05 MED ORDER — CEFAZOLIN SODIUM-DEXTROSE 2-4 GM/100ML-% IV SOLN
2.0000 g | INTRAVENOUS | Status: AC
  Administered 2020-01-05: 2 g via INTRAVENOUS

## 2020-01-05 SURGICAL SUPPLY — 32 items
ADAPTER IRRIG TUBE 2 SPIKE SOL (ADAPTER) ×4 IMPLANT
BAG URINE DRAIN 2000ML AR STRL (UROLOGICAL SUPPLIES) IMPLANT
BAG URO DRAIN 4000ML (MISCELLANEOUS) IMPLANT
CATH FOL 2WAY LX 20X30 (CATHETERS) IMPLANT
CATH FOL 2WAY LX 22X30 (CATHETERS) IMPLANT
CATH FOLEY 3WAY 30CC 22FR (CATHETERS) IMPLANT
CATH URETL 5X70 OPEN END (CATHETERS) ×2 IMPLANT
CONTAINER COLLECT MORCELLATR (MISCELLANEOUS) ×1 IMPLANT
DRAPE 3/4 80X56 (DRAPES) ×2 IMPLANT
DRAPE UTILITY 15X26 TOWEL STRL (DRAPES) IMPLANT
FILTER OVERFLOW MORCELLATOR (FILTER) ×1 IMPLANT
GLOVE BIO SURGEON STRL SZ 6.5 (GLOVE) ×4 IMPLANT
GOWN STRL REUS W/ TWL LRG LVL3 (GOWN DISPOSABLE) ×2 IMPLANT
GOWN STRL REUS W/TWL LRG LVL3 (GOWN DISPOSABLE) ×2
HOLDER FOLEY CATH W/STRAP (MISCELLANEOUS) ×2 IMPLANT
KIT TURNOVER CYSTO (KITS) ×2 IMPLANT
LASER FIBER 550M SMARTSCOPE (Laser) IMPLANT
LASER FIBER FLEXIVA 550 (UROLOGICAL SUPPLIES) ×2 IMPLANT
MBRN O SEALING YLW 17 FOR INST (MISCELLANEOUS) ×2
MEMBRANE SLNG YLW 17 FOR INST (MISCELLANEOUS) ×1 IMPLANT
MORCELLATOR COLLECT CONTAINER (MISCELLANEOUS) ×2
MORCELLATOR OVERFLOW FILTER (FILTER) ×2
MORCELLATOR ROTATION 4.75 335 (MISCELLANEOUS) ×2 IMPLANT
PACK CYSTO AR (MISCELLANEOUS) ×2 IMPLANT
SET CYSTO W/LG BORE CLAMP LF (SET/KITS/TRAYS/PACK) IMPLANT
SET IRRIG Y TYPE TUR BLADDER L (SET/KITS/TRAYS/PACK) ×2 IMPLANT
SLEEVE PROTECTION STRL DISP (MISCELLANEOUS) ×4 IMPLANT
SOL .9 NS 3000ML IRR  AL (IV SOLUTION) ×8
SOL .9 NS 3000ML IRR UROMATIC (IV SOLUTION) ×8 IMPLANT
SYRINGE IRR TOOMEY STRL 70CC (SYRINGE) ×2 IMPLANT
TUBE PUMP MORCELLATOR PIRANHA (TUBING) ×2 IMPLANT
WATER STERILE IRR 1000ML POUR (IV SOLUTION) ×2 IMPLANT

## 2020-01-05 NOTE — Progress Notes (Signed)
Sam V, PA-C, came to bedside to evaluate pt. Foley removed, new foley inserted with CBI. PA hand irrigated to remove clots. Pt alert and oriented, VSS. Fentanyl given for pain during procedure. Per patient, pain has resolved at this time. Output red with no evidence of clots. Some leakage around foley site. Sam, PA-C, aware. Awaiting orders for admission. Molly, ex-wife, updated.

## 2020-01-05 NOTE — Progress Notes (Signed)
Dr. Brandon at bedside.  

## 2020-01-05 NOTE — Progress Notes (Signed)
Pt IV was removed accidentally by pt. Dr. Noralyn Pick was notified. Ok for pt to not have IV at this time.

## 2020-01-05 NOTE — Op Note (Signed)
Date of procedure: 01/05/20  Preoperative diagnosis:  1. BPH with BOO  Postoperative diagnosis:  1. same   Procedure: 1. HoLEP with morcellation  Surgeon: Vanna Scotland, MD  Anesthesia: General  Complications: None  Intraoperative findings: Significant trilobar coaptation with a distinct enlarged median lobe.  Trabeculated bladder.  Hemostasis excellent.  UOs visualized with clear reflux at the end of the procedure.  EBL: 100 cc  Specimens: Prostate chips  Drains: 20 French two-way Foley catheter with 30 cc balloon (50 cc in this balloon)  Indication: Casey Arias is a 74 y.o. patient with refractory BPH who is elected to undergo holmium laser enucleation of the prostate given his very large prostate volume.  He also has a personal history of elevated PSA and his most recent PSA is somewhat more elevated than his previous baseline.  He is aware of this fact but is elected for prostate biopsy.  We will follow pathology results and repeat his PSA some months down the road to ensure there is trending downward.  After reviewing the management options for treatment, he elected to proceed with the above surgical procedure(s). We have discussed the potential benefits and risks of the procedure, side effects of the proposed treatment, the likelihood of the patient achieving the goals of the procedure, and any potential problems that might occur during the procedure or recuperation. Informed consent has been obtained.  Description of procedure:  The patient was taken to the operating room and general anesthesia was induced.  The patient was placed in the dorsal lithotomy position, prepped and draped in the usual sterile fashion, and preoperative antibiotics were administered. A preoperative time-out was performed.     A 26 French resectoscope sheath using a blunt angled obturator was introduced without difficulty into the bladder.  The bladder was carefully inspected and noted to be  moderately trabeculated.  There is an elevated bladder neck with a very small intravesical component.  The trigone was able to be visualized with some manipulation and the UOs were good distance bladder neck itself.  The prostatic fossa had significant trilobar coaptation with greater than 5 cm prostatic length.  A 550 m laser fiber was then brought in and using settings of 0.9 J's and 53 Hz, 2 incisions were created at the 5:00 and 7:00 positions of the bladder neck on either side of the median lobe down to the level of the bladder neck/capsular fibers.  The incision was carried down caudally meeting in the midline just above the verumontanum.  The median lobe was then enucleated from a caudal to cranial direction cleaving the adenoma off the underlying capsule rolling it towards the bladder neck and ultimately cleaving the mucosa to free the median lobe into the bladder.   Next, a semilunar incision was created at the prostatic apex on the left side again freeing up the adenoma from the underlying capsule.  Care was taken to avoid any resection past the verumontanum.  This incision was carried around laterally and cranially towards the bladder neck.  Ultimately, I was able to complete the anterior commissure mucosa and the adenoma into the bladder creating a widely patent prostatic fossa.     Next, the same similar incision was created at the right prostatic apex.  This adenoma however ended up being enucleated and more of a piece wise fashion freeing up a large BPH nodules from the capsular fibers.  Once this was completed and cleared from the bladder neck, the prostatic fossa was noted to  be widely patent.  Hemostasis was achieved using hemostatic fiber settings.  Bilateral UOs were visualized and free of any injury.  Finally, the 28 French resectoscope was exchanged for nephroscope and using the Piranha handpiece morcellator, the bladder was distended in each of the prostate chips were evacuated.  The  bladder was irrigated several times and smaller joules were clear for the bladder.  This point time, there were no residual fibers appreciated in the bladder.  Hemostasis was adequate.  10 mg of IV Lasix was administered to help with postoperative diuresis.  A 20 French two-way Foley catheter was then inserted over a catheter guide with 30 cc in the balloon.  It was overfilled with 50 cc. The catheter irrigated easily and well.  Patient was then clean and dry, repositioned supine position, reversed from anesthesia, taken to PACU in stable condition.   Plan: Patient will return to the office in 2 days for voiding trial.      Vanna Scotland, M.D.

## 2020-01-05 NOTE — Progress Notes (Signed)
   01/05/20 0700  Clinical Encounter Type  Visited With Patient  Visit Type Initial  Referral From Chaplain  Consult/Referral To Chaplain  Chaplain briefly spoke to patient, who is in good spirits. Patient said he is schedule to be going home by noon. Chaplain wished him well.

## 2020-01-05 NOTE — Progress Notes (Signed)
Foley catheter clotted. This RN attempted to irrigate catheter with little success. Dr. Apolinar Junes notified. Will come see pt shortly.

## 2020-01-05 NOTE — Transfer of Care (Signed)
Immediate Anesthesia Transfer of Care Note  Patient: Casey Arias  Procedure(s) Performed: HOLEP-LASER ENUCLEATION OF THE PROSTATE WITH MORCELLATION (N/A Prostate)  Patient Location: PACU  Anesthesia Type:General  Level of Consciousness: drowsy  Airway & Oxygen Therapy: Patient Spontanous Breathing and Patient connected to face mask oxygen  Post-op Assessment: Report given to RN and Post -op Vital signs reviewed and stable  Post vital signs: Reviewed and stable  Last Vitals:  Vitals Value Taken Time  BP 124/71 01/05/20 1149  Temp 36 C 01/05/20 1148  Pulse 73 01/05/20 1151  Resp 12 01/05/20 1151  SpO2 99 % 01/05/20 1151  Vitals shown include unvalidated device data.  Last Pain:  Vitals:   01/05/20 1148  TempSrc:   PainSc: Asleep         Complications: No complications documented.

## 2020-01-05 NOTE — Anesthesia Preprocedure Evaluation (Addendum)
Anesthesia Evaluation  Patient identified by MRN, date of birth, ID band Patient awake    Reviewed: Allergy & Precautions, NPO status , Patient's Chart, lab work & pertinent test results  Airway Mallampati: II       Dental  (+) Poor Dentition, Loose, Missing, Upper Dentures   Pulmonary shortness of breath and with exertion, asthma , COPD,  COPD inhaler, former smoker,    Pulmonary exam normal        Cardiovascular negative cardio ROS Normal cardiovascular exam+ Valvular Problems/Murmurs      Neuro/Psych negative neurological ROS  negative psych ROS   GI/Hepatic negative GI ROS, Neg liver ROS,   Endo/Other  negative endocrine ROS  Renal/GU negative Renal ROS     Musculoskeletal negative musculoskeletal ROS (+)   Abdominal Normal abdominal exam  (+)   Peds negative pediatric ROS (+)  Hematology negative hematology ROS (+)   Anesthesia Other Findings Past Medical History: No date: COPD (chronic obstructive pulmonary disease) (HCC) No date: Heart murmur No date: Hyperlipidemia  Reproductive/Obstetrics                            Anesthesia Physical Anesthesia Plan  ASA: III  Anesthesia Plan: General   Post-op Pain Management:    Induction: Intravenous  PONV Risk Score and Plan:   Airway Management Planned: Oral ETT  Additional Equipment:   Intra-op Plan:   Post-operative Plan: Extubation in OR  Informed Consent:   Plan Discussed with: CRNA and Surgeon  Anesthesia Plan Comments:         Anesthesia Quick Evaluation

## 2020-01-05 NOTE — H&P (Signed)
01/05/20 7:22 AM   [name] 1945/08/05 [id number]   HPI: Casey Arias is a 74 y.o. M with of BPH with urinary obstruction.   He underwent CT urogram in 05/2019 which showed some low attenuation enhancing renal lesions most compatible with cyst as well as BPH with median lobe component. No other GU pathology was identified. He did have an incidental pulmonary nodulesstatistically benign.  Cystoscopy on 09/09/18 significant for prostamegaly only with no concern for bladder cancer or any other malignancy.  Prostate measures 7.3 x 6.4 x 8.1 cm.--> 198 cc (likely over estimation of size based on presence of median lobe)  He continues to have significant lower urinary tract symptoms primarily obstructive. He is currently on Tamsulosin.     PMH: Past Medical History:  Diagnosis Date  . COPD (chronic obstructive pulmonary disease) (HCC)   . Heart murmur   . Hyperlipidemia     Surgical History: Past Surgical History:  Procedure Laterality Date  . HERNIA REPAIR    . KNEE ARTHROSCOPY Left   . TONSILLECTOMY      Home Medications:   Current Meds  Medication Sig  . albuterol (VENTOLIN HFA) 108 (90 Base) MCG/ACT inhaler Inhale 2 puffs every 4 to 6 hrs as needed for sob  . amoxicillin-clavulanate (AUGMENTIN) 875-125 MG tablet Take 1 tablet by mouth 2 (two) times daily.  . chlorhexidine (PERIDEX) 0.12 % solution Use as directed 15 mLs in the mouth or throat 2 (two) times daily.  Marland Kitchen EPINEPHrine (EPIPEN 2-PAK) 0.3 mg/0.3 mL IJ SOAJ injection Use as directed  as needed for anaphylaxis  . Multiple Vitamins-Minerals (MULTIVITAMIN WITH MINERALS) tablet Take 1 tablet by mouth daily.  . tamsulosin (FLOMAX) 0.4 MG CAPS capsule Take 1 capsule (0.4 mg total) by mouth daily.  Marland Kitchen triamcinolone cream (KENALOG) 0.1 % Apply to affected area twice a day as needed (Patient taking differently: Apply 1 application topically 2 (two) times daily as needed (irritation). Apply to affected  area twice a day as needed)  . [DISCONTINUED] albuterol (VENTOLIN HFA) 108 (90 Base) MCG/ACT inhaler Inhale 2 puffs every 4 to 6 hrs as needed for sob  . [DISCONTINUED] chlorhexidine (PERIDEX) 0.12 % solution Use as directed 15 mLs in the mouth or throat 2 (two) times daily.    Allergies:  Allergies  Allergen Reactions  . Bee Pollen Anaphylaxis    Bee stings  . Ciprofloxacin Rash    Family History: Family History  Problem Relation Age of Onset  . Lymphoma Father   . Bladder Cancer Mother   . Prostate cancer Paternal Grandfather   . Kidney cancer Neg Hx     Social History:  reports that he has quit smoking. His smoking use included cigarettes. He has never used smokeless tobacco. He reports that he does not drink alcohol and does not use drugs.   Physical Exam: BP (!) 147/103   Pulse 76   Temp 97.8 F (36.6 C) (Temporal)   Resp 16   Ht 6\' 1"  (1.854 m)   Wt 111.6 kg   SpO2 98%   BMI 32.46 kg/m   Constitutional:  Alert and oriented, No acute distress. HEENT: [place] AT, moist mucus membranes.  Trachea midline, no masses. Cardiovascular: No clubbing, cyanosis, or edema. RRR. Respiratory: Normal respiratory effort, no increased work of breathing. CTAB. GI: Abdomen is soft, nontender, nondistended, no abdominal masses Skin: No rashes, bruises or suspicious lesions. Neurologic: Grossly intact, no focal deficits, moving all 4 extremities. Psychiatric: Normal mood and  affect.  Laboratory Data: Lab Results  Component Value Date   WBC 9.0 11/06/2017   HGB 15.0 11/06/2017   HCT 44.4 11/06/2017   MCV 87 11/06/2017   PLT 302 11/06/2017    Lab Results  Component Value Date   CREATININE 0.90 04/07/2019   Urinalysis    Component Value Date/Time   APPEARANCEUR Clear 12/26/2019 1023   GLUCOSEU Negative 12/26/2019 1023   BILIRUBINUR Negative 12/26/2019 1023   PROTEINUR Negative 12/26/2019 1023   NITRITE Negative 12/26/2019 1023   LEUKOCYTESUR Negative 12/26/2019 1023     Lab Results  Component Value Date   LABMICR See below: 12/26/2019   WBCUA 0-5 12/26/2019   LABEPIT 0-10 12/26/2019   BACTERIA Few 12/26/2019    Pertinent Imaging:  CT HEMATURIA WORKUP  Narrative CLINICAL DATA:  74 year old male with history of microscopic hematuria. Intermittent sensation of bladder pressure.  EXAM: CT ABDOMEN AND PELVIS WITHOUT AND WITH CONTRAST  TECHNIQUE: Multidetector CT imaging of the abdomen and pelvis was performed following the standard protocol before and following the bolus administration of intravenous contrast.  CONTRAST:  OMNIPAQUE IOHEXOL 300 MG/ML  SOLN  COMPARISON:  No priors.  FINDINGS: Lower chest: Multiple small pulmonary nodules are noted throughout the visualized lung bases bilaterally measuring up to 4 mm (axial image 4 of series 3).  Hepatobiliary: Multiple subcentimeter low-attenuation lesions scattered throughout the liver, too small to characterize, but statistically likely to represent tiny cysts. No other larger more suspicious appearing hepatic lesions. No intra or extrahepatic biliary ductal dilatation. Gallbladder is normal in appearance.  Pancreas: No pancreatic mass. No pancreatic ductal dilatation. No pancreatic or peripancreatic fluid collections or inflammatory changes.  Spleen: Unremarkable.  Adrenals/Urinary Tract: No calculi identified within the collecting system of either kidney, along the course of either ureter, or within the lumen of the urinary bladder. No hydroureteronephrosis. In the right kidney there are two 1.4 cm low-attenuation nonenhancing lesions in the interpolar and upper polar regions, compatible with simple cysts. Left kidney and bilateral adrenal glands are normal in appearance. On postcontrast delayed images there are no definite filling defects within the collecting system of either kidney, along the course of either ureter, or within the lumen of the urinary bladder to  strongly suggest the presence of urothelial neoplasm at this time. Urinary bladder is normal in appearance. Bilateral adrenal glands are normal in appearance.  Stomach/Bowel: Normal appearance of the stomach. No pathologic dilatation of small bowel or colon. Numerous colonic diverticulae are noted, particularly in the sigmoid colon, without surrounding inflammatory changes to suggest an acute diverticulitis at this time. Normal appendix.  Vascular/Lymphatic: Aortic atherosclerosis, without evidence of aneurysm or dissection in the abdominal or pelvic vasculature. No lymphadenopathy noted in the abdomen or pelvis.  Reproductive: Prostate gland is enlarged with median lobe hypertrophy measuring 7.3 x 6.4 x 8.1 cm. Seminal vesicles are unremarkable in appearance.  Other: No significant volume of ascites.  No pneumoperitoneum.  Musculoskeletal: There are no aggressive appearing lytic or blastic lesions noted in the visualized portions of the skeleton.  IMPRESSION: 1. No explanation for the patient's history of hematuria. Specifically, no urinary tract calculi no findings of urinary tract obstruction. 2. Small low-attenuation nonenhancing lesions in the right kidney, compatible with simple cysts. No suspicious renal lesions. 3. Severe prostatomegaly with median lobe hypertrophy. 4. Small pulmonary nodules in the visualized lung bases bilaterally measuring up to 4 mm. These are nonspecific, but statistically likely benign. No follow-up needed if patient is low-risk (and has  no known or suspected primary neoplasm). Non-contrast chest CT can be considered in 12 months if patient is high-risk. This recommendation follows the consensus statement: Guidelines for Management of Incidental Pulmonary Nodules Detected on CT Images: From the Fleischner Society 2017; Radiology 2017; 284:228-243. 5. Colonic diverticulosis without evidence of acute diverticulitis at this time.   Electronically  Signed By: Trudie Reed M.D. On: 04/07/2019 09:20  No results found for this or any previous visit.   Assessment & Plan:    1. BPH with urinary obstruction  Refractory obstructive urinary symptoms in the setting of prostamegaly with median lobe component  Discussed maximum medical management with addition of finasteride vs surgical intervention (simple vs robotic vs open) vs embolization.   We discussed alternatives including TURP vs. holmium laser enucleation of the prostate Differences between the surgical procedures were discussed as well as the risks and benefits of each.Based on the prostate size of >100 g, he would likely benefit from holep as his primary intervention. We reviewed the surgery in detail today including the preoperative, intraoperative, and postoperative course. This will most likely be an outpatient procedure pending the degree of post op hematuria. He will go home with catheter for a 2 days post op and will either be taught how to remove his own catheter or return to the office for catheter removal.  Risk of bleeding, infection, damage surrounding structures, injury to the bladder/ urethral, bladder neck contracture, ureteral stricture, retrograde ejaculation, stress/ urge incontinence, exacerbation of irritative voiding symptoms were all discussed in detail.    He understands all of the above and is willing to proceed as planned. He would like to have his surgery in the middle of July after July 4th. Return for PSA couple weeks before surgery.   2. Hx of elevated PSA Secondary to above   Will check annual PSA prior to surgery   I discussed the limitations, risks, security and privacy concerns of performing an evaluation and management service by telephone and the availability of in person appointments. I also discussed with the patient that there may be a patient responsible charge related to this service. The patient expressed understanding and agreed  to proceed.  The patient was advised to call back or seek an in-person evaluation if the symptoms worsen or if the condition fails to improve as anticipated.    Vanna Scotland, MD  [facility] [address], [address] [place], [place] [address] [phone]

## 2020-01-05 NOTE — Anesthesia Procedure Notes (Signed)
Procedure Name: Intubation Date/Time: 01/05/2020 7:49 AM Performed by: Dava Najjar, CRNA Pre-anesthesia Checklist: Patient identified, Emergency Drugs available, Suction available and Patient being monitored Patient Re-evaluated:Patient Re-evaluated prior to induction Oxygen Delivery Method: Circle system utilized Preoxygenation: Pre-oxygenation with 100% oxygen Induction Type: IV induction Ventilation: Mask ventilation without difficulty Laryngoscope Size: McGraph and 4 Grade View: Grade I Tube type: Oral Tube size: 7.5 mm Number of attempts: 1 Airway Equipment and Method: Stylet,  Oral airway and Video-laryngoscopy Placement Confirmation: positive ETCO2,  breath sounds checked- equal and bilateral and ETT inserted through vocal cords under direct vision Secured at: 21 cm Tube secured with: Tape Dental Injury: Teeth and Oropharynx as per pre-operative assessment

## 2020-01-06 ENCOUNTER — Encounter: Payer: Self-pay | Admitting: Urology

## 2020-01-06 DIAGNOSIS — E785 Hyperlipidemia, unspecified: Secondary | ICD-10-CM | POA: Diagnosis present

## 2020-01-06 DIAGNOSIS — N320 Bladder-neck obstruction: Secondary | ICD-10-CM | POA: Diagnosis present

## 2020-01-06 DIAGNOSIS — Z807 Family history of other malignant neoplasms of lymphoid, hematopoietic and related tissues: Secondary | ICD-10-CM | POA: Diagnosis not present

## 2020-01-06 DIAGNOSIS — N138 Other obstructive and reflux uropathy: Secondary | ICD-10-CM

## 2020-01-06 DIAGNOSIS — N3289 Other specified disorders of bladder: Secondary | ICD-10-CM | POA: Diagnosis present

## 2020-01-06 DIAGNOSIS — Z8042 Family history of malignant neoplasm of prostate: Secondary | ICD-10-CM | POA: Diagnosis not present

## 2020-01-06 DIAGNOSIS — Y731 Therapeutic (nonsurgical) and rehabilitative gastroenterology and urology devices associated with adverse incidents: Secondary | ICD-10-CM | POA: Diagnosis not present

## 2020-01-06 DIAGNOSIS — T83091A Other mechanical complication of indwelling urethral catheter, initial encounter: Secondary | ICD-10-CM | POA: Diagnosis not present

## 2020-01-06 DIAGNOSIS — Z87891 Personal history of nicotine dependence: Secondary | ICD-10-CM | POA: Diagnosis not present

## 2020-01-06 DIAGNOSIS — N401 Enlarged prostate with lower urinary tract symptoms: Secondary | ICD-10-CM | POA: Diagnosis not present

## 2020-01-06 DIAGNOSIS — Z20822 Contact with and (suspected) exposure to covid-19: Secondary | ICD-10-CM | POA: Diagnosis present

## 2020-01-06 DIAGNOSIS — J449 Chronic obstructive pulmonary disease, unspecified: Secondary | ICD-10-CM | POA: Diagnosis present

## 2020-01-06 DIAGNOSIS — R310 Gross hematuria: Secondary | ICD-10-CM | POA: Diagnosis not present

## 2020-01-06 DIAGNOSIS — Z8052 Family history of malignant neoplasm of bladder: Secondary | ICD-10-CM | POA: Diagnosis not present

## 2020-01-06 LAB — BASIC METABOLIC PANEL
Anion gap: 4 — ABNORMAL LOW (ref 5–15)
BUN: 12 mg/dL (ref 8–23)
CO2: 27 mmol/L (ref 22–32)
Calcium: 8.4 mg/dL — ABNORMAL LOW (ref 8.9–10.3)
Chloride: 105 mmol/L (ref 98–111)
Creatinine, Ser: 0.7 mg/dL (ref 0.61–1.24)
GFR calc Af Amer: >60 mL/min (ref >60)
GFR calc non Af Amer: >60 mL/min (ref >60)
Glucose, Bld: 120 mg/dL — ABNORMAL HIGH (ref 70–99)
Potassium: 4.1 mmol/L (ref 3.5–5.1)
Sodium: 136 mmol/L (ref 135–145)

## 2020-01-06 LAB — HEMOGLOBIN AND HEMATOCRIT, BLOOD
HCT: 31.6 % — ABNORMAL LOW (ref 39.0–52.0)
Hemoglobin: 11.1 g/dL — ABNORMAL LOW (ref 13.0–17.0)

## 2020-01-06 NOTE — Progress Notes (Signed)
Dr. Lonna Cobb at bedside with pt. Will continue to monitor.

## 2020-01-06 NOTE — Progress Notes (Signed)
Urology Inpatient Progress Note  Subjective: Casey Arias is a 74 y.o. male admitted on CBI following HOLEP with postoperative clot retention and Foley occlusion.  Creatinine 0.70.  Hemoglobin 11.1.  Rapid flow CBI with pink-tinged effluent this morning.  He reports some lower abdominal pressure.  Passing flatus.  Pain well controlled.  Anti-infectives: Anti-infectives (From admission, onward)   Start     Dose/Rate Route Frequency Ordered Stop   01/05/20 0615  ceFAZolin (ANCEF) 2-4 GM/100ML-% IVPB       Note to Pharmacy: Mikey Bussing   : cabinet override      01/05/20 0615 01/05/20 0755   01/05/20 0610  ceFAZolin (ANCEF) IVPB 2g/100 mL premix        2 g 200 mL/hr over 30 Minutes Intravenous 30 min pre-op 01/05/20 0610 01/05/20 0802      Current Facility-Administered Medications  Medication Dose Route Frequency Provider Last Rate Last Admin  . Chlorhexidine Gluconate Cloth 2 % PADS 6 each  6 each Topical Daily Vanna Scotland, MD      . HYDROcodone-acetaminophen (NORCO/VICODIN) 5-325 MG per tablet 1-2 tablet  1-2 tablet Oral Q6H PRN Stoioff, Scott C, MD      . opium-belladonna (B&O) suppository 16.2-60mg   1 suppository Rectal Q8H PRN Stoioff, Scott C, MD      . sodium chloride irrigation 0.9 % 3,000 mL  3,000 mL Irrigation Continuous Villa Burgin, PA-C   3,000 mL at 01/05/20 1803     Objective: Vital signs in last 24 hours: Temp:  [96.8 F (36 C)-98.2 F (36.8 C)] 98 F (36.7 C) (07/13 0427) Pulse Rate:  [41-101] 76 (07/13 0427) Resp:  [13-20] 20 (07/13 0427) BP: (100-156)/(60-115) 127/60 (07/13 0427) SpO2:  [94 %-99 %] 98 % (07/13 0427)  Intake/Output from previous day: 07/12 0701 - 07/13 0700 In: 62694 [P.O.:100; I.V.:2400; IV Piggyback:100] Out: 85462 [Urine:65600] Intake/Output this shift: Total I/O In: 3000 [Other:3000] Out: 3000 [Urine:3000]  Physical Exam Vitals and nursing note reviewed.  Constitutional:      General: He is not in acute  distress.    Appearance: He is not ill-appearing, toxic-appearing or diaphoretic.  HENT:     Head: Normocephalic and atraumatic.  Pulmonary:     Effort: Pulmonary effort is normal. No respiratory distress.  Skin:    General: Skin is warm and dry.  Neurological:     Mental Status: He is alert and oriented to person, place, and time.  Psychiatric:        Mood and Affect: Mood normal.        Behavior: Behavior normal.    Lab Results:  Recent Labs    01/06/20 0417  HGB 11.1*  HCT 31.6*   BMET Recent Labs    01/06/20 0417  NA 136  K 4.1  CL 105  CO2 27  GLUCOSE 120*  BUN 12  CREATININE 0.70  CALCIUM 8.4*   Assessment & Plan: 74 year old male s/p HOLEP admitted postoperatively on CBI for management of gross hematuria with clot retention/catheter occlusion.  Anticoagulation held.  Gross hematuria improving this morning on rapid flow CBI.  I turned down CBI to slow drip this morning.  Urine changed to pink in color and appeared stable.  No clots visualized.  Recommendations: -Continue slow drip CBI this morning, only increase flow rate if urine returns to cherry red color -If effluent remains pink or lighter in color, turn CBI off late this morning for trial off irrigation -If patient can maintain pink or lighter effluent  off CBI, okay for discharge today -Continue to hold anticoagulation  Carman Ching, PA-C 01/06/2020

## 2020-01-06 NOTE — Progress Notes (Deleted)
Pt's CBI bags are being changed out every 30 minutes, and supply on has a small remainder of 3,038mL left in storage. Notified Dr. Lonna Cobb of the situation, and he said it was okay to slow down the rate. Pt's urine does not have any clots, however does still appear bright red. MD is aware.

## 2020-01-06 NOTE — Progress Notes (Signed)
CBI adjusted as patient woke to it fluids backing up. MD made aware. Will flush and titrate slow drip rate as needed. Resumed slow drip, will continue to monitor.

## 2020-01-06 NOTE — Anesthesia Postprocedure Evaluation (Signed)
Anesthesia Post Note  Patient: Casey Arias  Procedure(s) Performed: HOLEP-LASER ENUCLEATION OF THE PROSTATE WITH MORCELLATION (N/A Prostate)  Patient location during evaluation: PACU Anesthesia Type: General Level of consciousness: awake and alert and oriented Pain management: pain level controlled Vital Signs Assessment: post-procedure vital signs reviewed and stable Respiratory status: spontaneous breathing Cardiovascular status: blood pressure returned to baseline Anesthetic complications: no   No complications documented.   Last Vitals:  Vitals:   01/06/20 0427 01/06/20 1050  BP: 127/60 (!) 110/58  Pulse: 76 68  Resp: 20 18  Temp: 36.7 C   SpO2: 98% 100%    Last Pain:  Vitals:   01/06/20 1303  TempSrc:   PainSc: 0-No pain                 Terrie Grajales

## 2020-01-06 NOTE — Progress Notes (Signed)
Pt's CBI bags are being changed out every 30 minutes, and supply on has a small remainder of 3,000mL left in storage. Notified Dr. Stoioff of the situation, and he said it was okay to slow down the rate. Pt's urine does not have any clots, however does still appear bright red. MD is aware. 

## 2020-01-06 NOTE — Progress Notes (Signed)
Contacted by nursing staff stating CBI has been running wide open since patient transferred to floor from PACU and going through 3 L bags every 30 minutes with CBI effluent described as bright red.  No clots or need for hand irrigation.  Vital signs have been stable and patient without discomfort.  Initially told to slow rate and called back with no worsening but effluent described as bright red.  Arrived to see patient and CBI effluent in tubing was clear. Was unable to determine rate as drip chamber was completely full.  This was corrected and CBI effluent was clear to pink-tinged on moderate flow.   Plan: Continue CBI at present rate and titrate to keep effluent clear to pink.  Educated nursing staff on determining rate by effluent in catheter tubing and not Foley bag.

## 2020-01-07 LAB — HEMOGLOBIN AND HEMATOCRIT, BLOOD
HCT: 30.8 % — ABNORMAL LOW (ref 39.0–52.0)
Hemoglobin: 10.4 g/dL — ABNORMAL LOW (ref 13.0–17.0)

## 2020-01-07 LAB — SURGICAL PATHOLOGY

## 2020-01-07 NOTE — Progress Notes (Addendum)
Urology Inpatient Progress Note  Subjective: No acute events overnight He has required hand irrigation 2/2 clot formation CBI on fast drip this morning with cherry red output; small clot visualized in drainage tubing and patient reporting lower abdominal pressure and the urge to urinate.  Anti-infectives: Anti-infectives (From admission, onward)   Start     Dose/Rate Route Frequency Ordered Stop   01/05/20 0615  ceFAZolin (ANCEF) 2-4 GM/100ML-% IVPB       Note to Pharmacy: Mikey Bussing   : cabinet override      01/05/20 0615 01/05/20 0755   01/05/20 0610  ceFAZolin (ANCEF) IVPB 2g/100 mL premix        2 g 200 mL/hr over 30 Minutes Intravenous 30 min pre-op 01/05/20 0610 01/05/20 0802      Current Facility-Administered Medications  Medication Dose Route Frequency Provider Last Rate Last Admin  . Chlorhexidine Gluconate Cloth 2 % PADS 6 each  6 each Topical Daily Vanna Scotland, MD   6 each at 01/06/20 1121  . HYDROcodone-acetaminophen (NORCO/VICODIN) 5-325 MG per tablet 1-2 tablet  1-2 tablet Oral Q6H PRN Stoioff, Scott C, MD      . opium-belladonna (B&O) suppository 16.2-60mg   1 suppository Rectal Q8H PRN Stoioff, Scott C, MD      . sodium chloride irrigation 0.9 % 3,000 mL  3,000 mL Irrigation Continuous Daman Steffenhagen, PA-C   3,000 mL at 01/05/20 1803   Objective: Vital signs in last 24 hours: Temp:  [98.1 F (36.7 C)-98.6 F (37 C)] 98.6 F (37 C) (07/14 0504) Pulse Rate:  [56-78] 78 (07/14 0504) Resp:  [18-20] 18 (07/14 0504) BP: (110-136)/(58-66) 120/66 (07/14 0504) SpO2:  [97 %-100 %] 98 % (07/14 0504)  Intake/Output from previous day: 07/13 0701 - 07/14 0700 In: 72620 [P.O.:240] Out: 17900 [Urine:17900] Intake/Output this shift: Total I/O In: -  Out: 2250 [Urine:2250]  Physical Exam Vitals and nursing note reviewed.  Constitutional:      General: He is not in acute distress.    Appearance: He is not ill-appearing, toxic-appearing or diaphoretic.   HENT:     Head: Normocephalic and atraumatic.  Pulmonary:     Effort: Pulmonary effort is normal. No respiratory distress.  Skin:    General: Skin is warm and dry.  Neurological:     Mental Status: He is alert and oriented to person, place, and time.  Psychiatric:        Mood and Affect: Mood normal.        Behavior: Behavior normal.    Lab Results:  Recent Labs    01/06/20 0417  HGB 11.1*  HCT 31.6*   BMET Recent Labs    01/06/20 0417  NA 136  K 4.1  CL 105  CO2 27  GLUCOSE 120*  BUN 12  CREATININE 0.70  CALCIUM 8.4*   Assessment & Plan: 74 year old male s/p HOLEP admitted postoperatively on CBI for management of gross hematuria with clot retention/catheter occlusion. Anticoagulation held. Gross hematuria stable this morning on fast drip CBI.  I irrigated the patient's catheter at the bedside this morning with sterile water with return of approximately 10cc clot material. Catheter immediately drained brisk cherry red effluent; patient reported improvement in lower abdominal pressure. CBI on slow drip at the end of the procedure with pink effluent. Catheter placed on tension.  H&H ordered this morning. Will continue to monitor. Will plan to turn off CBI late this morning; if urine return remains pink after 1 hour without CBI,  will proceed with fill and pull voiding trial today.  Carman Ching, PA-C 01/07/2020

## 2020-01-08 MED ORDER — HYDROCODONE-ACETAMINOPHEN 5-325 MG PO TABS: 1.0000 | ORAL_TABLET | Freq: Four times a day (QID) | ORAL | 0 refills | Status: DC | PRN

## 2020-01-08 MED ORDER — DOCUSATE SODIUM 100 MG PO CAPS
100.0000 mg | ORAL_CAPSULE | Freq: Two times a day (BID) | ORAL | 0 refills | Status: DC
Start: 2020-01-08 — End: 2020-02-24

## 2020-01-08 NOTE — Progress Notes (Signed)
Urology Inpatient Progress Note  Subjective: Patient reports passing several small clots without difficulty overnight. CBI on slow drip with light pink output, no clots visualized.  Anti-infectives: Anti-infectives (From admission, onward)   Start     Dose/Rate Route Frequency Ordered Stop   01/05/20 0615  ceFAZolin (ANCEF) 2-4 GM/100ML-% IVPB       Note to Pharmacy: Mikey Bussing   : cabinet override      01/05/20 0615 01/05/20 0755   01/05/20 0610  ceFAZolin (ANCEF) IVPB 2g/100 mL premix        2 g 200 mL/hr over 30 Minutes Intravenous 30 min pre-op 01/05/20 0610 01/05/20 0802      Current Facility-Administered Medications  Medication Dose Route Frequency Provider Last Rate Last Admin   Chlorhexidine Gluconate Cloth 2 % PADS 6 each  6 each Topical Daily Vanna Scotland, MD   6 each at 01/07/20 1000   HYDROcodone-acetaminophen (NORCO/VICODIN) 5-325 MG per tablet 1-2 tablet  1-2 tablet Oral Q6H PRN Stoioff, Scott C, MD       opium-belladonna (B&O) suppository 16.2-60mg   1 suppository Rectal Q8H PRN Stoioff, Scott C, MD   1 suppository at 01/08/20 0326   sodium chloride irrigation 0.9 % 3,000 mL  3,000 mL Irrigation Continuous Dosha Broshears, PA-C   3,000 mL at 01/05/20 1803   Objective: Vital signs in last 24 hours: Temp:  [97.6 F (36.4 C)-98.4 F (36.9 C)] 97.6 F (36.4 C) (07/15 0306) Pulse Rate:  [66-92] 92 (07/15 0306) Resp:  [16-18] 18 (07/15 0306) BP: (117-132)/(63-67) 117/67 (07/15 0306) SpO2:  [98 %-99 %] 98 % (07/15 0306)  Intake/Output from previous day: 07/14 0701 - 07/15 0700 In: 02637 [P.O.:720] Out: 15600 [Urine:15600] Intake/Output this shift: Total I/O In: 3000 [Other:3000] Out: 1200 [Urine:1200]  Physical Exam Vitals and nursing note reviewed.  Constitutional:      General: He is not in acute distress.    Appearance: He is not ill-appearing, toxic-appearing or diaphoretic.  HENT:     Head: Normocephalic and atraumatic.  Pulmonary:      Effort: Pulmonary effort is normal. No respiratory distress.  Skin:    General: Skin is warm and dry.  Neurological:     Mental Status: He is alert and oriented to person, place, and time.  Psychiatric:        Mood and Affect: Mood normal.        Behavior: Behavior normal.    Lab Results:  Recent Labs    01/06/20 0417 01/07/20 0916  HGB 11.1* 10.4*  HCT 31.6* 30.8*   BMET Recent Labs    01/06/20 0417  NA 136  K 4.1  CL 105  CO2 27  GLUCOSE 120*  BUN 12  CREATININE 0.70  CALCIUM 8.4*   Assessment & Plan: 74 year old male s/p HOLEP admitted postoperatively on CBI for management of gross hematuria with clot retention/catheter occlusion.  Anticoagulation held.  Gross hematuria significantly improved this morning on slow drip CBI.  Irrigated the patient's catheter at the bedside this morning with 250 cc sterile water with return of approximately 5 cc of clot material.  Urine cleared to pink-tinged.  Catheter draining well.  At this point, I clamped the patient's drainage line and around the CBI at full speed to fill the patient's bladder until he felt the urge to urinate.  I subsequently turned CBI off, drained the Foley balloon of 60 cc sterile water, and remove the catheter in its entirety.  Patient was able to void 100 cc  red urine without clots.  Patient to push fluids this morning and continue to urinate as needed.  Recommend PVR later this morning to ensure appropriate emptying.  If he is emptying well, will proceed with discharge later today.  Additionally, I counseled the patient on normal postoperative findings after HOLEP.  I explained that he may experience dysuria, gross hematuria, and urinary leakage after surgery.  I counseled him on warning signs of gross hematuria, including passage of large clots and the sudden inability to urinate.  I counseled him on Kegel exercises and instructed him to start these 3x10 sets daily to increase urinary control.  He expressed  understanding.  Recommendations: -Push fluids -Bladder scan later this morning -If emptying well, will proceed with discharge  Carman Ching, PA-C 01/08/2020

## 2020-01-08 NOTE — Progress Notes (Signed)
MD ordered patient to be discharged home.  Discharge instructions were reviewed with the patient and he voiced understanding.  Follow-up appointment was made.  Prescriptions sent to the patients pharmacy.   All patients questions were answered.

## 2020-01-08 NOTE — Progress Notes (Signed)
Pt DC to home, Discharge documentation given and Pt taken down by on wheelchair volunteer .

## 2020-01-08 NOTE — Discharge Instructions (Signed)
Holmium Laser Enucleation of the Prostate (HoLEP)  HoLEP is a treatment for men with benign prostatic hyperplasia (BPH). The laser surgery removed blockages of urine flow, and is done without any incisions on the body.     What is HoLEP?  HoLEP is a type of laser surgery used to treat obstruction (blockage) of urine flow as a result of benign prostatic hyperplasia (BPH). In men with BPH, the prostate gland is not cancerous, but has become enlarged. An enlarged prostate can result in a number of urinary tract symptoms such as weak urinary stream, difficulty in starting urination, inability to urinate, frequent urination, or getting up at night to urinate.  HoLEP was developed in the 1990's as a more effective and less expensive surgical option for BPH, compared to other surgical options such as laser vaporization(PVP/greenlight laser), transurethral resection of the prostate(TURP), and open simple prostatectomy.   What happens during a HoLEP?  HoLEP requires general anesthesia ("asleep" throughout the procedure).   An antibiotic is given to reduce the risk of infection  A surgical instrument called a resectoscope is inserted through the urethra (the tube that carries urine from the bladder). The resectoscope has a camera that allows the surgeon to view the internal structure of the prostate gland, and to see where the incisions are being made during surgery.  The laser is inserted into the resectoscope and is used to enucleate (free up) the enlarged prostate tissue from the capsule (outer shell) and then to seal up any blood vessels. The tissue that has been removed is pushed back into the bladder.  A morcellator is placed through the resectoscope, and is used to suction out the prostate tissue that has been pushed into the bladder.  When the prostate tissue has been removed, the resectoscope is removed, and a foley catheter is placed to allow healing and drain the urine from the  bladder.     What happens after a HoLEP?  More than 90% of patients go home the same day a few hours after surgery. Less than 10% will be admitted to the hospital overnight for observation to monitor the urine, or if they have other medical problems.  Fluid is flushed through the catheter for about 1 hour after surgery to clear any blood from the urine. It is normal to have some blood in the urine after surgery. The need for blood transfusion is extremely rare.  Eating and drinking are permitted after the procedure once the patient has fully awakened from anesthesia.  The catheter is usually removed 2-3 days after surgery- the patient will come to clinic to have the catheter removed and make sure they can urinate on their own.  It is very important to drink lots of fluids after surgery for one week to keep the bladder flushed.  At first, there may be some burning with urination, but this typically improved within a few hours to days. Most patients do not have a significant amount of pain, and narcotic pain medications are rarely needed.  Symptoms of urinary frequency, urgency, and even leakage are NORMAL for the first few weeks after surgery as the bladder adjusts after having to work hard against blockage from the prostate for many years. This will improve, but can sometimes take several months.  The use of pelvic floor exercises (Kegel exercises) can help improve problems with urinary incontinence.   After catheter removal, patients will be seen at 6 weeks and 6 months for symptom check  No heavy lifting for   at least 2-3 weeks after surgery, however patients can walk and do light activities the first day after surgery. Return to work time depends on occupation.    What are the advantages of HoLEP?  HoLEP has been studied in many different parts of the world and has been shown to be a safe and effective procedure. Although there are many types of BPH surgeries available, HoLEP offers a  unique advantage in being able to remove a large amount of tissue without any incisions on the body, even in very large prostates, while decreasing the risk of bleeding and providing tissue for pathology (to look for cancer). This decreases the need for blood transfusions during surgery, minimizes hospital stay, and reduces the risk of needing repeat treatment.  What are the side effects of HoLEP?  Temporary burning and bleeding during urination. Some blood may be seen in the urine for weeks after surgery and is part of the healing process.  Urinary incontinence (inability to control urine flow) is expected in all patients immediately after surgery and they should wear pads for the first few days/weeks. This typically improves over the course of several weeks. Performing Kegel exercises can help decrease leakage from stress maneuvers such as coughing, sneezing, or lifting. The rate of long term leakage is very low. Patients may also have leakage with urgency and this may be treated with medication. The risk of urge incontinence can be dependent on several factors including age, prostate size, symptoms, and other medical problems.  Retrograde ejaculation or "backwards ejaculation." In 75% of cases, the patient will not see any fluid during ejaculation after surgery.  Erectile function is generally not significantly affected.   What are the risks of HoLEP?  Injury to the urethra or development of scar tissue at a later date  Injury to the capsule of the prostate (typically treated with longer catheterization).  Injury to the bladder or ureteral orifices (where the urine from the kidney drains out)  Infection of the bladder, testes, or kidneys  Return of urinary obstruction at a later date requiring another operation (<2%)  Need for blood transfusion or re-operation due to bleeding  Failure to relieve all symptoms and/or need for prolonged catheterization after surgery  5-15% of patients are  found to have previously undiagnosed prostate cancer in their specimen. Prostate cancer can be treated after HoLEP.  Standard risks of anesthesia including blood clots, heart attacks, etc  When should I call my doctor?  Fever over 101.3 degrees  Inability to urinate, or large blood clots in the urine  -------------------------------------------------------------------------------- Kegel Exercises  Kegel exercises can help strengthen your pelvic floor muscles. The pelvic floor is a group of muscles that support your rectum, small intestine, and bladder. In females, pelvic floor muscles also help support the womb (uterus). These muscles help you control the flow of urine and stool. Kegel exercises are painless and simple, and they do not require any equipment. Your provider may suggest Kegel exercises to:  Improve bladder and bowel control.  Improve sexual response.  Improve weak pelvic floor muscles after surgery to remove the uterus (hysterectomy) or pregnancy (females).  Improve weak pelvic floor muscles after prostate gland removal or surgery (males). Kegel exercises involve squeezing your pelvic floor muscles, which are the same muscles you squeeze when you try to stop the flow of urine or keep from passing gas. The exercises can be done while sitting, standing, or lying down, but it is best to vary your position. Exercises How to   do Kegel exercises: 1. Squeeze your pelvic floor muscles tight. You should feel a tight lift in your rectal area. If you are a male, you should also feel a tightness in your vaginal area. Keep your stomach, buttocks, and legs relaxed. 2. Hold the muscles tight for up to 10 seconds. 3. Breathe normally. 4. Relax your muscles. 5. Repeat as told by your health care provider. Repeat this exercise daily as told by your health care provider. Continue to do this exercise for at least 4-6 weeks, or for as long as told by your health care provider. You may be  referred to a physical therapist who can help you learn more about how to do Kegel exercises. Depending on your condition, your health care provider may recommend:  Varying how long you squeeze your muscles.  Doing several sets of exercises every day.  Doing exercises for several weeks.  Making Kegel exercises a part of your regular exercise routine. This information is not intended to replace advice given to you by your health care provider. Make sure you discuss any questions you have with your health care provider. Document Revised: 01/30/2018 Document Reviewed: 01/30/2018 Elsevier Patient Education  2020 Elsevier Inc.  

## 2020-01-08 NOTE — Discharge Summary (Signed)
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 Briefly, Casey Arias is a 74 y.o. year old patient admitted on 01/05/2020 following scheduled HOLEP with morcellation for management of BPH with BOO.   Hospital Course: Patient tolerated the procedure well.  He developed gross hematuria with clot retention/catheter occlusion in the PACU requiring admission on CBI.  His hospital course was uncomplicated and H&H remained relatively stable.  On POD#3 he had met discharge criteria: was eating a regular diet, was up and ambulating independently,  pain was well controlled, was voiding without a catheter, and was ready for discharge.  Laboratory values:  Recent Labs    01/06/20 0417 01/07/20 0916  HGB 11.1* 10.4*  HCT 31.6* 30.8*   Recent Labs    01/06/20 0417  NA 136  K 4.1  CL 105  CO2 27  GLUCOSE 120*  BUN 12  CREATININE 0.70  CALCIUM 8.4*   Results for orders placed or performed during the hospital encounter of 01/01/20  SARS CORONAVIRUS 2 (TAT 6-24 HRS) Nasopharyngeal Nasopharyngeal Swab     Status: None   Collection  Time: 01/01/20 10:05 AM   Specimen: Nasopharyngeal Swab  Result Value Ref Range Status   SARS Coronavirus 2 NEGATIVE NEGATIVE Final    Comment: (NOTE) SARS-CoV-2 target nucleic acids are NOT DETECTED.  The SARS-CoV-2 RNA is generally detectable in upper and lower respiratory specimens during the acute phase of infection. Negative results do not preclude SARS-CoV-2 infection, do not rule out co-infections with other pathogens, and should not be used as the sole basis for treatment or other patient management decisions. Negative results must be combined with clinical observations, patient history, and epidemiological information. The expected result is Negative.  Fact Sheet for Patients: SugarRoll.be  Fact Sheet for Healthcare Providers: https://www.woods-mathews.com/  This test is not yet approved or cleared by the [place] FDA and  has been authorized for detection and/or diagnosis of SARS-CoV-2 by FDA under an Emergency Use Authorization (EUA). This EUA will remain  in effect (meaning this test can be used) for the duration of the COVID-19 declaration under Se ction 564(b)(1) of the Act, 21 U.S.C. section 360bbb-3(b)(1), unless the authorization is terminated or revoked sooner.  Performed at [facility], [place] [address] [place], [place] [address]    Disposition: Home  Discharge instruction: The patient was instructed to be ambulatory but told to refrain from heavy lifting, strenuous activity, or driving while on narcotic pain medications.   Discharge medications:  Allergies as of 01/08/2020      Reactions   Bee Pollen Anaphylaxis   Bee stings   Ciprofloxacin Rash      Medication List    STOP taking these medications  amoxicillin-clavulanate 875-125 MG tablet Commonly known as: AUGMENTIN   chlorhexidine 0.12 % solution Commonly known as: PERIDEX   tamsulosin 0.4 MG Caps capsule Commonly known as: FLOMAX      TAKE these medications   albuterol 108 (90 Base) MCG/ACT inhaler Commonly known as: Ventolin HFA Inhale 2 puffs every 4 to 6 hrs as needed for sob   docusate sodium 100 MG capsule Commonly known as: Colace Take 1 capsule (100 mg total) by mouth 2 (two) times daily.   EPINEPHrine 0.3 mg/0.3 mL Soaj injection Commonly known as: EpiPen 2-Pak Use as directed  as needed for anaphylaxis   HYDROcodone-acetaminophen 5-325 MG tablet Commonly known as: NORCO/VICODIN Take 1 tablet by mouth every 6 (six) hours as needed for moderate pain.   multivitamin with minerals tablet Take 1 tablet by mouth daily.   triamcinolone cream 0.1 % Commonly known as: KENALOG Apply to affected area twice a day as needed What changed:   how much to take  how to take this  when to take this  reasons to take this       Followup:   Follow-up Information    Hollice Espy, MD In 6 weeks.   Specialty: Urology Contact information: [place] [id number] [phone]

## 2020-01-22 ENCOUNTER — Other Ambulatory Visit: Payer: Medicare HMO

## 2020-02-03 ENCOUNTER — Ambulatory Visit: Payer: Medicare HMO | Admitting: Urology

## 2020-02-06 ENCOUNTER — Ambulatory Visit (INDEPENDENT_AMBULATORY_CARE_PROVIDER_SITE_OTHER): Payer: Medicare HMO | Admitting: Urology

## 2020-02-06 ENCOUNTER — Other Ambulatory Visit: Payer: Self-pay

## 2020-02-06 VITALS — BP 135/80 | HR 80 | Wt 240.0 lb

## 2020-02-06 DIAGNOSIS — R300 Dysuria: Secondary | ICD-10-CM | POA: Diagnosis not present

## 2020-02-06 DIAGNOSIS — R339 Retention of urine, unspecified: Secondary | ICD-10-CM | POA: Diagnosis not present

## 2020-02-06 LAB — MICROSCOPIC EXAMINATION: WBC, UA: >30 /hpf — AB (ref 0–5)

## 2020-02-06 LAB — URINALYSIS, COMPLETE
Bilirubin, UA: NEGATIVE
Glucose, UA: NEGATIVE
Ketones, UA: NEGATIVE
Nitrite, UA: POSITIVE — AB
Specific Gravity, UA: 1.01 (ref 1.005–1.030)
Urobilinogen, Ur: 0.2 mg/dL (ref 0.2–1.0)
pH, UA: 6 (ref 5.0–7.5)

## 2020-02-06 LAB — BLADDER SCAN AMB NON-IMAGING: Scan Result: 400

## 2020-02-06 MED ORDER — SULFAMETHOXAZOLE-TRIMETHOPRIM 800-160 MG PO TABS: 1.0000 | ORAL_TABLET | Freq: Two times a day (BID) | ORAL | 0 refills | Status: DC

## 2020-02-08 ENCOUNTER — Encounter: Payer: Self-pay | Admitting: Urology

## 2020-02-08 NOTE — Progress Notes (Signed)
02/06/2020 8:05 PM   Casey Arias 1945-12-22 [id number]  Referring provider: Carlean Jews, NP [address] [place],  [place] [address]  Chief Complaint  Patient presents with  . Urinary Retention    HPI: Casey Arias is a 74 y.o. male who is status post HoLEP who presents today unable to void since last night.  He underwent HoLEP with Dr. Apolinar Arias on 01/05/2020.  His post procedural course has been as expected and uneventful.    Starting earlier in the week, he wasn't feeling good.  He was having suprapubic discomfort that right sided pain.  Yesterday, he noted that his urine was becoming cloudy and he was having some dysuria.    Last night, he was unable to urinate.    He brought in an urine specimen from yesterday in a glass jar.  It was the color and consistency of chicken broth.   Patient denies any modifying or aggravating factors.  Patient denies any gross hematuria.  Patient denies any fevers, chills, nausea or vomiting.     Bladder scan noted 400 mL.  When I stepped out to gather catheter supplies, patient was able to urinate in the urinal with a PVR of 320 cc of cloudy urine.   UA grossly infected.    PMH: Past Medical History:  Diagnosis Date  . COPD (chronic obstructive pulmonary disease) (HCC)   . Heart murmur   . Hyperlipidemia     Surgical History: Past Surgical History:  Procedure Laterality Date  . HERNIA REPAIR    . HOLEP-LASER ENUCLEATION OF THE PROSTATE WITH MORCELLATION N/A 01/05/2020   Procedure: HOLEP-LASER ENUCLEATION OF THE PROSTATE WITH MORCELLATION;  Surgeon: Casey Scotland, MD;  Location: [facility] ORS;  Service: Urology;  Laterality: N/A;  . KNEE ARTHROSCOPY Left   . TONSILLECTOMY      Home Medications:  Allergies as of 02/06/2020      Reactions   Bee Pollen Anaphylaxis   Bee stings   Ciprofloxacin Rash      Medication List       Accurate as of February 06, 2020 11:59 PM. If you have any questions, ask your nurse or  doctor.        albuterol 108 (90 Base) MCG/ACT inhaler Commonly known as: Ventolin HFA Inhale 2 puffs every 4 to 6 hrs as needed for sob   chlorhexidine 0.12 % solution Commonly known as: PERIDEX 15 mLs 2 (two) times daily.   docusate sodium 100 MG capsule Commonly known as: Colace Take 1 capsule (100 mg total) by mouth 2 (two) times daily.   EPINEPHrine 0.3 mg/0.3 mL Soaj injection Commonly known as: EpiPen 2-Pak Use as directed  as needed for anaphylaxis   HYDROcodone-acetaminophen 5-325 MG tablet Commonly known as: NORCO/VICODIN Take 1 tablet by mouth every 6 (six) hours as needed for moderate pain.   multivitamin with minerals tablet Take 1 tablet by mouth daily.   sulfamethoxazole-trimethoprim 800-160 MG tablet Commonly known as: BACTRIM DS Take 1 tablet by mouth every 12 (twelve) hours. Started by: Casey Cowboy, PA-C   triamcinolone cream 0.1 % Commonly known as: KENALOG Apply to affected area twice a day as needed What changed:   how much to take  how to take this  when to take this  reasons to take this       Allergies:  Allergies  Allergen Reactions  . Bee Pollen Anaphylaxis    Bee stings  . Ciprofloxacin Rash    Family History: Family History  Problem Relation Age of Onset  . Lymphoma Father   . Bladder Cancer Mother   . Prostate cancer Paternal Grandfather   . Kidney cancer Neg Hx     Social History:  reports that he has quit smoking. His smoking use included cigarettes. He has never used smokeless tobacco. He reports that he does not drink alcohol and does not use drugs.  ROS: Pertinent ROS in HPI  Physical Exam: BP 135/80   Pulse 80   Wt 240 lb (108.9 kg)   BMI 31.66 kg/m   Constitutional:  Well nourished. Alert and oriented, No acute distress. HEENT: Casey Arias AT, mask in place.  Trachea midline Cardiovascular: No clubbing, cyanosis, or edema. Respiratory: Normal respiratory effort, no increased work of breathing. Neurologic:  Grossly intact, no focal deficits, moving all 4 extremities. Psychiatric: Normal mood and affect.  Laboratory Data: Lab Results  Component Value Date   WBC 9.0 11/06/2017   HGB 10.4 (L) 01/07/2020   HCT 30.8 (L) 01/07/2020   MCV 87 11/06/2017   PLT 302 11/06/2017    Lab Results  Component Value Date   CREATININE 0.70 01/06/2020    Lab Results  Component Value Date   TSH 2.130 11/06/2017       Component Value Date/Time   CHOL 210 (H) 11/06/2017 0951   HDL 49 11/06/2017 0951   CHOLHDL 4.3 11/06/2017 0951   LDLCALC 144 (H) 11/06/2017 0951    Lab Results  Component Value Date   AST 23 11/06/2017   Lab Results  Component Value Date   ALT 19 11/06/2017    Urinalysis Component     Latest Ref Rng & Units 02/06/2020  Specific Gravity, UA     1.005 - 1.030 1.010  pH, UA     5.0 - 7.5 6.0  Color, UA     Yellow Yellow  Appearance Ur     Clear Cloudy (A)  Leukocytes,UA     Negative 3+ (A)  Protein,UA     Negative/Trace 1+ (A)  Glucose, UA     Negative Negative  Ketones, UA     Negative Negative  RBC, UA     Negative 3+ (A)  Bilirubin, UA     Negative Negative  Urobilinogen, Ur     0.2 - 1.0 mg/dL 0.2  Nitrite, UA     Negative Positive (A)  Microscopic Examination      See below:   Component     Latest Ref Rng & Units 02/06/2020  WBC, UA     0 - 5 /hpf >30 (A)  RBC     0 - 2 /hpf 3-10 (A)  Epithelial Cells (non renal)     0 - 10 /hpf 0-10  Bacteria, UA     None seen/Few Moderate (A)   I have reviewed the labs.   Pertinent Imaging: Results for Casey, Arias (MRN [id number]) as of 02/08/2020 20:00  Ref. Range 02/06/2020 09:35  Scan Result Unknown 400 ML   Assessment & Plan:    1. Urinary retention - secondary to infection - Urinalysis, Complete - BLADDER SCAN AMB NON-IMAGING  2. Dysuria - CULTURE, URINE COMPREHENSIVE - start Septra DS - will adjust if necessary once culture are available  Return for pending urine culture results  .  These notes generated with voice recognition software. I apologize for typographical errors.  Casey Cowboy, PA-C  [facility] [address]  [address] [place], [place] [address] [phone]

## 2020-02-09 LAB — CULTURE, URINE COMPREHENSIVE

## 2020-02-10 ENCOUNTER — Telehealth: Payer: Self-pay | Admitting: Family Medicine

## 2020-02-10 NOTE — Telephone Encounter (Signed)
Patient notified and voiced understanding.

## 2020-02-10 NOTE — Telephone Encounter (Signed)
-----   Message from Harle Battiest, PA-C sent at 02/10/2020  8:49 AM EDT ----- Please let Mr. Heard know that his urine culture was positive for infection and that the Septra is the appropriate antibiotic.  I hope he is feeling better and to keep his appointment with Dr. Apolinar Junes on the 31st.

## 2020-02-11 ENCOUNTER — Telehealth: Payer: Self-pay

## 2020-02-11 NOTE — Telephone Encounter (Signed)
Confirmed for office visit on 8/20

## 2020-02-13 ENCOUNTER — Encounter: Payer: Self-pay | Admitting: Nurse Practitioner

## 2020-02-13 ENCOUNTER — Other Ambulatory Visit: Payer: Self-pay

## 2020-02-13 ENCOUNTER — Ambulatory Visit (INDEPENDENT_AMBULATORY_CARE_PROVIDER_SITE_OTHER): Payer: Medicare HMO | Admitting: Nurse Practitioner

## 2020-02-13 VITALS — BP 130/65 | HR 59 | Temp 97.4°F | Resp 16 | Ht 72.0 in | Wt 233.2 lb

## 2020-02-13 DIAGNOSIS — R319 Hematuria, unspecified: Secondary | ICD-10-CM | POA: Diagnosis not present

## 2020-02-13 DIAGNOSIS — N401 Enlarged prostate with lower urinary tract symptoms: Secondary | ICD-10-CM | POA: Diagnosis not present

## 2020-02-13 DIAGNOSIS — N138 Other obstructive and reflux uropathy: Secondary | ICD-10-CM | POA: Diagnosis not present

## 2020-02-13 DIAGNOSIS — N390 Urinary tract infection, site not specified: Secondary | ICD-10-CM

## 2020-02-13 DIAGNOSIS — J4520 Mild intermittent asthma, uncomplicated: Secondary | ICD-10-CM

## 2020-02-13 DIAGNOSIS — Z0001 Encounter for general adult medical examination with abnormal findings: Secondary | ICD-10-CM

## 2020-02-13 DIAGNOSIS — R300 Dysuria: Secondary | ICD-10-CM | POA: Diagnosis not present

## 2020-02-13 LAB — POCT URINALYSIS DIPSTICK (MANUAL)
Nitrite, UA: POSITIVE — AB
Poct Bilirubin: NEGATIVE
Poct Glucose: NORMAL mg/dL
Poct Ketones: NEGATIVE
Poct Protein: 30 mg/dL — AB
Poct Urobilinogen: NORMAL mg/dL
Spec Grav, UA: 1.01 (ref 1.010–1.025)
pH, UA: 6.5 (ref 5.0–8.0)

## 2020-02-13 MED ORDER — AMOXICILLIN 875 MG PO TABS: 875.0000 mg | ORAL_TABLET | Freq: Two times a day (BID) | ORAL | 0 refills | Status: DC

## 2020-02-13 MED ORDER — NITROFURANTOIN MONOHYD MACRO 100 MG PO CAPS: 100.0000 mg | ORAL_CAPSULE | Freq: Two times a day (BID) | ORAL | 0 refills | Status: DC

## 2020-02-13 MED ORDER — PHENAZOPYRIDINE HCL 200 MG PO TABS: 200.0000 mg | ORAL_TABLET | Freq: Three times a day (TID) | ORAL | 0 refills | Status: DC | PRN

## 2020-02-13 NOTE — Progress Notes (Signed)
[facility] [address] [place], [place] [address]  Internal MEDICINE  Office Visit Note  Patient Name: Casey Arias  [id number]  [id number]  Date of Service: 02/29/2020   Pt is here for routine health maintenance examination  Chief Complaint  Patient presents with  . Medicare Wellness    pt noticed cloudy urine with black specks like when he had an infection   . Hyperlipidemia  . Quality Metric Gaps    TDAP     The patient is here for health maintenance exam. Today, he states that he is having urinary retention. When he is able to urinate, he does have burning when he urinates. He states that the urine looks like grapefruit juice with pulp. It is cloudy and there are sme dark colored flakes. He was treated, per his urologist last week for UTI. He took last antibiotic yesterday. He states that symptoms hae not improved that much. He did have surgery 01/05/2020 to remove much of his prostate. He has had intermittent problems with urination since the surgery.     Current Medication: Outpatient Encounter Medications as of 02/13/2020  Medication Sig  . albuterol (VENTOLIN HFA) 108 (90 Base) MCG/ACT inhaler Inhale 2 puffs every 4 to 6 hrs as needed for sob  . chlorhexidine (PERIDEX) 0.12 % solution 15 mLs 2 (two) times daily. (Patient not taking: Reported on 02/24/2020)  . EPINEPHrine (EPIPEN 2-PAK) 0.3 mg/0.3 mL IJ SOAJ injection Use as directed  as needed for anaphylaxis  . Multiple Vitamins-Minerals (MULTIVITAMIN WITH MINERALS) tablet Take 1 tablet by mouth daily.  Marland Kitchen triamcinolone cream (KENALOG) 0.1 % Apply to affected area twice a day as needed (Patient taking differently: Apply 1 application topically 2 (two) times daily as needed (irritation). Apply to affected area twice a day as needed)  . HYDROcodone-acetaminophen (NORCO/VICODIN) 5-325 MG tablet Take 1 tablet by mouth every 6 (six) hours as needed for moderate pain.  . [DISCONTINUED] amoxicillin (AMOXIL) 875 MG  tablet Take 1 tablet (875 mg total) by mouth 2 (two) times daily.  . [DISCONTINUED] docusate sodium (COLACE) 100 MG capsule Take 1 capsule (100 mg total) by mouth 2 (two) times daily.  . [DISCONTINUED] nitrofurantoin, macrocrystal-monohydrate, (MACROBID) 100 MG capsule Take 1 capsule (100 mg total) by mouth 2 (two) times daily. (Patient not taking: Reported on 02/24/2020)  . [DISCONTINUED] phenazopyridine (PYRIDIUM) 200 MG tablet Take 1 tablet (200 mg total) by mouth 3 (three) times daily as needed for pain.  . [DISCONTINUED] sulfamethoxazole-trimethoprim (BACTRIM DS) 800-160 MG tablet Take 1 tablet by mouth every 12 (twelve) hours. (Patient not taking: Reported on 02/13/2020)   No facility-administered encounter medications on file as of 02/13/2020.    Surgical History: Past Surgical History:  Procedure Laterality Date  . HERNIA REPAIR    . HOLEP-LASER ENUCLEATION OF THE PROSTATE WITH MORCELLATION N/A 01/05/2020   Procedure: HOLEP-LASER ENUCLEATION OF THE PROSTATE WITH MORCELLATION;  Surgeon: Vanna Scotland, MD;  Location: [facility] ORS;  Service: Urology;  Laterality: N/A;  . KNEE ARTHROSCOPY Left   . TONSILLECTOMY      Medical History: Past Medical History:  Diagnosis Date  . COPD (chronic obstructive pulmonary disease) (HCC)   . Heart murmur   . Hyperlipidemia     Family History: Family History  Problem Relation Age of Onset  . Lymphoma Father   . Bladder Cancer Mother   . Prostate cancer Paternal Grandfather   . Kidney cancer Neg Hx       Review of Systems  Constitutional: Negative for activity change, chills, fatigue and unexpected weight change.  HENT: Negative for congestion, postnasal drip, rhinorrhea, sneezing and sore throat.   Respiratory: Negative for cough, chest tightness, shortness of breath and wheezing.   Cardiovascular: Negative for chest pain and palpitations.  Gastrointestinal: Negative for abdominal pain, constipation, diarrhea, nausea and vomiting.   Endocrine: Negative for cold intolerance, heat intolerance, polydipsia and polyuria.  Genitourinary: Positive for dysuria, flank pain, frequency and hematuria.  Musculoskeletal: Negative for arthralgias, back pain, joint swelling and neck pain.  Skin: Negative for rash.  Allergic/Immunologic: Negative for environmental allergies.  Neurological: Negative for dizziness, tremors, numbness and headaches.  Hematological: Negative for adenopathy. Does not bruise/bleed easily.  Psychiatric/Behavioral: Negative for behavioral problems (Depression), sleep disturbance and suicidal ideas. The patient is not nervous/anxious.      Today's Vitals   02/13/20 1056  BP: 130/65  Pulse: (!) 59  Resp: 16  Temp: (!) 97.4 F (36.3 C)  SpO2: 97%  Weight: 233 lb 3.2 oz (105.8 kg)  Height: 6' (1.829 m)   Body mass index is 31.63 kg/m.  Physical Exam Vitals and nursing note reviewed.  Constitutional:      General: He is not in acute distress.    Appearance: Normal appearance. He is well-developed. He is not diaphoretic.  HENT:     Head: Normocephalic and atraumatic.     Nose: Nose normal.     Mouth/Throat:     Pharynx: No oropharyngeal exudate.  Eyes:     Pupils: Pupils are equal, round, and reactive to light.  Neck:     Thyroid: No thyromegaly.     Vascular: No carotid bruit or JVD.     Trachea: No tracheal deviation.  Cardiovascular:     Rate and Rhythm: Normal rate and regular rhythm.     Pulses: Normal pulses.     Heart sounds: Normal heart sounds. No murmur heard.  No friction rub. No gallop.   Pulmonary:     Effort: Pulmonary effort is normal. No respiratory distress.     Breath sounds: Normal breath sounds. No wheezing or rales.  Chest:     Chest wall: No tenderness.  Abdominal:     General: Bowel sounds are normal.     Palpations: Abdomen is soft.     Tenderness: There is no abdominal tenderness.  Genitourinary:    Comments: U/a positive for large WBC and large protein. Also  positive for nitrites.  Musculoskeletal:        General: Normal range of motion.     Cervical back: Normal range of motion and neck supple.  Lymphadenopathy:     Cervical: No cervical adenopathy.  Skin:    General: Skin is warm and dry.     Capillary Refill: Capillary refill takes 2 to 3 seconds.  Neurological:     General: No focal deficit present.     Mental Status: He is alert and oriented to person, place, and time.     Cranial Nerves: No cranial nerve deficit.  Psychiatric:        Mood and Affect: Mood normal.        Behavior: Behavior normal.        Thought Content: Thought content normal.        Judgment: Judgment normal.    Depression screen [facility] 2/9 02/13/2020 08/15/2019 05/01/2019 02/11/2019 10/01/2018  Decreased Interest 0 0 0 0 0  Down, Depressed, Hopeless 0 0 0 0 0  PHQ - 2 Score 0 0  0 0 0    Functional Status Survey: Is the patient deaf or have difficulty hearing?: No Does the patient have difficulty seeing, even when wearing glasses/contacts?: Yes Does the patient have difficulty concentrating, remembering, or making decisions?: Yes (remembering sometimes) Does the patient have difficulty walking or climbing stairs?: No Does the patient have difficulty dressing or bathing?: No Does the patient have difficulty doing errands alone such as visiting a doctor's office or shopping?: No  MMSE - Mini Mental State Exam 02/13/2020 10/26/2017  Orientation to time 5 5  Orientation to Place 5 5  Registration 3 3  Attention/ Calculation 5 5  Recall 3 3  Language- name 2 objects 2 2  Language- repeat 1 1  Language- follow 3 step command 3 3  Language- read & follow direction 1 1  Write a sentence 1 1  Copy design 1 1  Total score 30 30    Fall Risk  02/13/2020 08/15/2019 05/01/2019 02/11/2019 10/01/2018  Falls in the past year? 0 0 0 1 0  Number falls in past yr: - - - 0 -  Injury with Fall? - - - 0 -      LABS: Recent Results (from the past 2160 hour(s))  CULTURE, URINE  COMPREHENSIVE     Status: None   Collection Time: 12/26/19 10:23 AM   Specimen: Urine   UR  Result Value Ref Range   Urine Culture, Comprehensive Final report    Organism ID, Bacteria Comment     Comment: No growth in 36 - 48 hours.  Urinalysis, Complete     Status: None   Collection Time: 12/26/19 10:23 AM  Result Value Ref Range   Specific Gravity, UA 1.010 1.005 - 1.030   pH, UA 7.0 5.0 - 7.5   Color, UA Yellow Yellow   Appearance Ur Clear Clear   Leukocytes,UA Negative Negative   Protein,UA Negative Negative/Trace   Glucose, UA Negative Negative   Ketones, UA Negative Negative   RBC, UA Negative Negative   Bilirubin, UA Negative Negative   Urobilinogen, Ur 0.2 0.2 - 1.0 mg/dL   Nitrite, UA Negative Negative   Microscopic Examination See below:   Microscopic Examination     Status: None   Collection Time: 12/26/19 10:23 AM   Urine  Result Value Ref Range   WBC, UA 0-5 0 - 5 /hpf   RBC 0-2 0 - 2 /hpf   Epithelial Cells (non renal) 0-10 0 - 10 /hpf   Bacteria, UA Few None seen/Few  PSA     Status: Abnormal   Collection Time: 12/26/19 10:28 AM  Result Value Ref Range   Prostate Specific Ag, Serum 7.8 (H) 0.0 - 4.0 ng/mL    Comment: Roche ECLIA methodology. According to the American Urological Association, Serum PSA should decrease and remain at undetectable levels after radical prostatectomy. The AUA defines biochemical recurrence as an initial PSA value 0.2 ng/mL or greater followed by a subsequent confirmatory PSA value 0.2 ng/mL or greater. Values obtained with different assay methods or kits cannot be used interchangeably. Results cannot be interpreted as absolute evidence of the presence or absence of malignant disease.   SARS CORONAVIRUS 2 (TAT 6-24 HRS) Nasopharyngeal Nasopharyngeal Swab     Status: None   Collection Time: 01/01/20 10:05 AM   Specimen: Nasopharyngeal Swab  Result Value Ref Range   SARS Coronavirus 2 NEGATIVE NEGATIVE    Comment:  (NOTE) SARS-CoV-2 target nucleic acids are NOT DETECTED.  The SARS-CoV-2 RNA is  generally detectable in upper and lower respiratory specimens during the acute phase of infection. Negative results do not preclude SARS-CoV-2 infection, do not rule out co-infections with other pathogens, and should not be used as the sole basis for treatment or other patient management decisions. Negative results must be combined with clinical observations, patient history, and epidemiological information. The expected result is Negative.  Fact Sheet for Patients: HairSlick.no  Fact Sheet for Healthcare Providers: quierodirigir.com  This test is not yet approved or cleared by the [place] FDA and  has been authorized for detection and/or diagnosis of SARS-CoV-2 by FDA under an Emergency Use Authorization (EUA). This EUA will remain  in effect (meaning this test can be used) for the duration of the COVID-19 declaration under Se ction 564(b)(1) of the Act, 21 U.S.C. section 360bbb-3(b)(1), unless the authorization is terminated or revoked sooner.  Performed at [facility], [address], [place], [place] [address]   Surgical pathology     Status: None   Collection Time: 01/05/20  8:01 AM  Result Value Ref Range   SURGICAL PATHOLOGY      SURGICAL PATHOLOGY CASE: [id number] PATIENT: Karma Ganja Surgical Pathology Report     Specimen Submitted: A. 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 PSA 7.8.     DIAGNOSIS: A. PROSTATE; HOLMIUM LASER ENUCLEATION OF THE PROSTATE WITH MORCELLATION: - BENIGN PROSTATIC GLANDULAR AND STROMAL HYPERPLASIA.  GROSS DESCRIPTION: A. Labeled: Prostate chips Received: Formalin Type of procedure: Enucleation of prostate with morcellation Weight: 85.79 grams Size: 13.5 x 13 x 4.5 cm Description: Received are multiple fragments of tan-pink soft tissue  and blood clot. Percentage of tissue submitted for microscopic review: 30%  Block summary: 1 - 15 - representative sections of soft tissue   Final Diagnosis performed by Ronald Lobo, MD.   Electronically signed 01/07/2020 12:52:50PM The electronic signature indicates that the named Attending Pathologist has evaluated the specimen Technical component performed at [facility], [address], [place], [place] [address] Lab: [phone] Dir: Jolene Schimke, MD, MMM  Professional component performed at [facility], [facility], [address] [place], [place], [place] [address] Lab: [phone] Dir: Georgiann Cocker. Rubinas, MD   Hemoglobin and hematocrit, blood     Status: Abnormal   Collection Time: 01/06/20  4:17 AM  Result Value Ref Range   Hemoglobin 11.1 (L) 13.0 - 17.0 g/dL   HCT 00.8 (L) 39 - 52 %    Comment: Performed at [facility], [address] [place], [place] [address]  Basic metabolic panel     Status: Abnormal   Collection Time: 01/06/20  4:17 AM  Result Value Ref Range   Sodium 136 135 - 145 mmol/L   Potassium 4.1 3.5 - 5.1 mmol/L   Chloride 105 98 - 111 mmol/L   CO2 27 22 - 32 mmol/L   Glucose, Bld 120 (H) 70 - 99 mg/dL    Comment: Glucose reference range applies only to samples taken after fasting for at least 8 hours.   BUN 12 8 - 23 mg/dL   Creatinine, Ser 5.09 0.61 - 1.24 mg/dL   Calcium 8.4 (L) 8.9 - 10.3 mg/dL   GFR calc non Af Amer >60 >60 mL/min   GFR calc Af Amer >60 >60 mL/min   Anion gap 4 (L) 5 - 15    Comment: Performed at [facility], [address] [place], [place] [address]  Hemoglobin and hematocrit, blood     Status: Abnormal  Collection Time: 01/07/20  9:16 AM  Result Value Ref Range   Hemoglobin 10.4 (L) 13.0 - 17.0 g/dL   HCT 67.2 (L) 39 - 52 %    Comment: Performed at [facility], [address] [place], [place] [address]  BLADDER SCAN AMB NON-IMAGING     Status: None   Collection Time: 02/06/20  9:35 AM   Result Value Ref Range   Scan Result 400 ML   Urinalysis, Complete     Status: Abnormal   Collection Time: 02/06/20  9:57 AM  Result Value Ref Range   Specific Gravity, UA 1.010 1.005 - 1.030   pH, UA 6.0 5.0 - 7.5   Color, UA Yellow Yellow   Appearance Ur Cloudy (A) Clear   Leukocytes,UA 3+ (A) Negative   Protein,UA 1+ (A) Negative/Trace   Glucose, UA Negative Negative   Ketones, UA Negative Negative   RBC, UA 3+ (A) Negative   Bilirubin, UA Negative Negative   Urobilinogen, Ur 0.2 0.2 - 1.0 mg/dL   Nitrite, UA Positive (A) Negative   Microscopic Examination See below:   CULTURE, URINE COMPREHENSIVE     Status: Abnormal   Collection Time: 02/06/20  9:57 AM   Specimen: Urine   UR  Result Value Ref Range   Urine Culture, Comprehensive Final report (A)    Organism ID, Bacteria Staphylococcus aureus (A)     Comment: 50,000-100,000 colony forming units per mL Based on susceptibility to oxacillin this isolate would be susceptible to: *Penicillinase-stable penicillins, such as:   Cloxacillin, Dicloxacillin, Nafcillin *Beta-lactam combination agents, such as:   Amoxicillin-clavulanic acid, Ampicillin-sulbactam,   Piperacillin-tazobactam *Oral cephems, such as:   Cefaclor, Cefdinir, Cefpodoxime, Cefprozil, Cefuroxime,   Cephalexin, Loracarbef *Parenteral cephems, such as:   Cefazolin, Cefepime, Cefotaxime, Cefotetan, Ceftaroline,   Ceftizoxime, Ceftriaxone, Cefuroxime *Carbapenems, such as:   Doripenem, Ertapenem, Imipenem, Meropenem    ANTIMICROBIAL SUSCEPTIBILITY Comment     Comment:       ** S = Susceptible; I = Intermediate; R = Resistant **                    P = Positive; N = Negative             MICS are expressed in micrograms per mL    Antibiotic                 RSLT#1    RSLT#2    RSLT#3    RSLT#4 Ciprofloxacin                  S Gentamicin                     S Levofloxacin                   S Linezolid                      S Moxifloxacin                    S Nitrofurantoin                 S Oxacillin                      S Penicillin                     R Quinupristin/Dalfopristin      S  Rifampin                       S Tetracycline                   R Trimethoprim/Sulfa             S Vancomycin                     S   Microscopic Examination     Status: Abnormal   Collection Time: 02/06/20  9:57 AM   Urine  Result Value Ref Range   WBC, UA >30 (A) 0 - 5 /hpf   RBC 3-10 (A) 0 - 2 /hpf   Epithelial Cells (non renal) 0-10 0 - 10 /hpf   Bacteria, UA Moderate (A) None seen/Few  CULTURE, URINE COMPREHENSIVE     Status: Abnormal   Collection Time: 02/13/20 12:00 AM   Specimen: Urine   Urine  Result Value Ref Range   Urine Culture, Comprehensive Final report (A)    Organism ID, Bacteria Staphylococcus aureus (A)     Comment: 25,000-50,000 colony forming units per mL Based on susceptibility to oxacillin this isolate would be susceptible to: *Penicillinase-stable penicillins, such as:   Cloxacillin, Dicloxacillin, Nafcillin *Beta-lactam combination agents, such as:   Amoxicillin-clavulanic acid, Ampicillin-sulbactam,   Piperacillin-tazobactam *Oral cephems, such as:   Cefaclor, Cefdinir, Cefpodoxime, Cefprozil, Cefuroxime,   Cephalexin, Loracarbef *Parenteral cephems, such as:   Cefazolin, Cefepime, Cefotaxime, Cefotetan, Ceftaroline,   Ceftizoxime, Ceftriaxone, Cefuroxime *Carbapenems, such as:   Doripenem, Ertapenem, Imipenem, Meropenem    ANTIMICROBIAL SUSCEPTIBILITY Comment     Comment:       ** S = Susceptible; I = Intermediate; R = Resistant **                    P = Positive; N = Negative             MICS are expressed in micrograms per mL    Antibiotic                 RSLT#1    RSLT#2    RSLT#3    RSLT#4 Ciprofloxacin                  S Gentamicin                     S Levofloxacin                   S Linezolid                      S Moxifloxacin                   S Nitrofurantoin                 S Oxacillin                       S Penicillin                     R Quinupristin/Dalfopristin      S Rifampin                       S Tetracycline                   R Trimethoprim/Sulfa  S Vancomycin                     S   POCT Urinalysis Dip Manual     Status: Abnormal   Collection Time: 02/13/20 11:35 AM  Result Value Ref Range   Spec Grav, UA 1.010 1.010 - 1.025   pH, UA 6.5 5.0 - 8.0   Leukocytes, UA Large (3+) (A) Negative   Nitrite, UA Positive (A) Negative   Poct Protein +30 (A) Negative, trace mg/dL   Poct Glucose Normal Normal mg/dL   Poct Ketones Negative Negative   Poct Urobilinogen Normal Normal mg/dL   Poct Bilirubin Negative Negative   Poct Blood trace Negative, trace  BLADDER SCAN AMB NON-IMAGING     Status: None   Collection Time: 02/24/20 11:18 AM  Result Value Ref Range   Scan Result 96 ML   Urinalysis, Complete     Status: Abnormal   Collection Time: 02/24/20 11:18 AM  Result Value Ref Range   Specific Gravity, UA 1.010 1.005 - 1.030   pH, UA 6.0 5.0 - 7.5   Color, UA Yellow Yellow   Appearance Ur Cloudy (A) Clear   Leukocytes,UA 2+ (A) Negative   Protein,UA Negative Negative/Trace   Glucose, UA Negative Negative   Ketones, UA Negative Negative   RBC, UA 2+ (A) Negative   Bilirubin, UA Negative Negative   Urobilinogen, Ur 0.2 0.2 - 1.0 mg/dL   Nitrite, UA Negative Negative   Microscopic Examination See below:   Microscopic Examination     Status: Abnormal   Collection Time: 02/24/20 11:18 AM   Urine  Result Value Ref Range   WBC, UA >30 (A) 0 - 5 /hpf   RBC 11-30 (A) 0 - 2 /hpf   Epithelial Cells (non renal) 0-10 0 - 10 /hpf   Bacteria, UA Few None seen/Few  CULTURE, URINE COMPREHENSIVE     Status: None   Collection Time: 02/24/20 11:37 AM   Specimen: Urine   UR  Result Value Ref Range   Urine Culture, Comprehensive Final report    Organism ID, Bacteria Comment     Comment: No growth in 36 - 48 hours.    Assessment/Plan:  1. Encounter for general  adult medical examination with abnormal findings Annual health maintenance exam today.   2. Urinary tract infection with hematuria, site unspecified Start macrobid 100mg  bid for 10 days. Urine sent for culture and sensitivity and will adjust antibiotics as indicated.  - CULTURE, URINE COMPREHENSIVE  3. BPH with obstruction/lower urinary tract symptoms Patient should continue regular visits with urology as scheduled.   4. Dysuria May take pyridium up to three times dally as needed for bladder pain and spasms.  - POCT Urinalysis Dip Manual  5. Mild intermittent asthma without complication Patient should continue to use rescue inhaler as needed and as prescribed for wheezing/SOB.   General Counseling: eryn krejci understanding of the findings of todays visit and agrees with plan of treatment. I have discussed any further diagnostic evaluation that may be needed or ordered today. We also reviewed his medications today. he has been encouraged to call the office with any questions or concerns that should arise related to todays visit.    Counseling:  This patient was seen by Marion Downer FNP Collaboration with Dr Vincent Gros as a part of collaborative care agreement  Orders Placed This Encounter  Procedures  . CULTURE, URINE COMPREHENSIVE  . POCT Urinalysis Dip Manual  Meds ordered this encounter  Medications  . DISCONTD: phenazopyridine (PYRIDIUM) 200 MG tablet    Sig: Take 1 tablet (200 mg total) by mouth 3 (three) times daily as needed for pain.    Dispense:  10 tablet    Refill:  0    Order Specific Question:   Supervising Provider    Answer:   Lyndon Code [[id number]]  . DISCONTD: nitrofurantoin, macrocrystal-monohydrate, (MACROBID) 100 MG capsule    Sig: Take 1 capsule (100 mg total) by mouth 2 (two) times daily.    Dispense:  20 capsule    Refill:  0    Order Specific Question:   Supervising Provider    Answer:   Lyndon Code [[id number]]  . DISCONTD: amoxicillin  (AMOXIL) 875 MG tablet    Sig: Take 1 tablet (875 mg total) by mouth 2 (two) times daily.    Dispense:  20 tablet    Refill:  0    Patient to take this after completion of macrobid if needed for ENT infection.    Order Specific Question:   Supervising Provider    Answer:   Lyndon Code [[id number]]    Total time spent: 45 Minutes  Time spent includes review of chart, medications, test results, and follow up plan with the patient.     Lyndon Code, MD  Internal Medicine

## 2020-02-16 ENCOUNTER — Other Ambulatory Visit: Payer: Self-pay | Admitting: Nurse Practitioner

## 2020-02-16 ENCOUNTER — Telehealth: Payer: Self-pay

## 2020-02-16 DIAGNOSIS — N390 Urinary tract infection, site not specified: Secondary | ICD-10-CM

## 2020-02-16 MED ORDER — SULFAMETHOXAZOLE-TRIMETHOPRIM 800-160 MG PO TABS: 1.0000 | ORAL_TABLET | Freq: Two times a day (BID) | ORAL | 0 refills | Status: DC

## 2020-02-16 NOTE — Telephone Encounter (Signed)
PT NOTIFIED  

## 2020-02-16 NOTE — Telephone Encounter (Signed)
I changed the antibiotic to bactrim DS twice daily for next 7 days. This should be effective per culture results. Thanks.

## 2020-02-18 IMAGING — CT CT ABD-PEL WO/W CM
3 of 12 series · 11 of 46 positions shown, 17 images · IV contrast (omnipaque)
Comparison: No priors.

CLINICAL DATA: 73-year-old male with history of microscopic
hematuria. Intermittent sensation of bladder pressure.

EXAM:
CT ABDOMEN AND PELVIS WITHOUT AND WITH CONTRAST
TECHNIQUE: Multidetector CT imaging of the abdomen and pelvis was performed
following the standard protocol before and following the bolus
administration of intravenous contrast.
CONTRAST:  125mL OMNIPAQUE IOHEXOL 300 MG/ML  SOLN

[Series 2: abd without pre · axial · non-contrast · 0.79mm/px · z∈[-1554,-1194]mm · 7 of 97 slices shown, 12 images]
[im 13/97  soft-tissue]
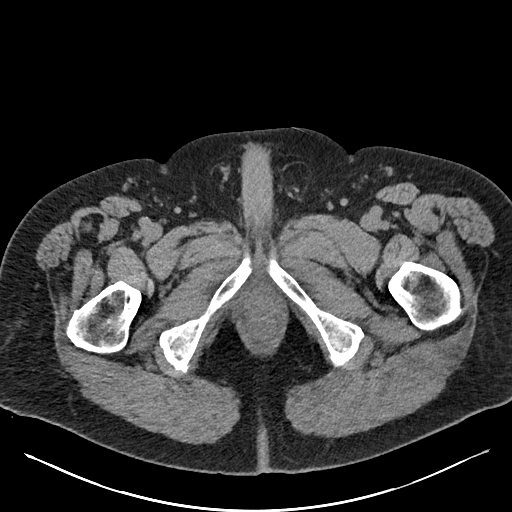
[im 13/97  bone]
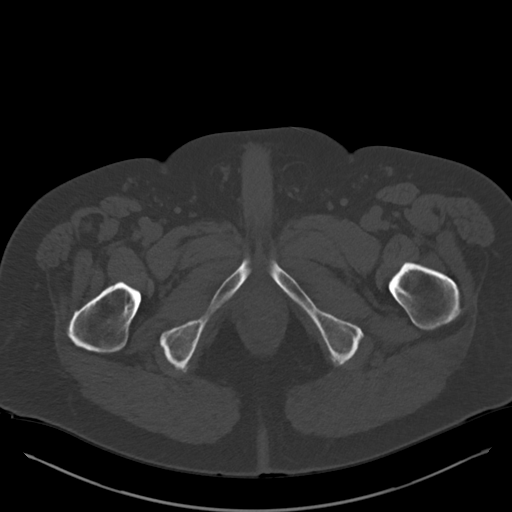
[im 25/97  soft-tissue]
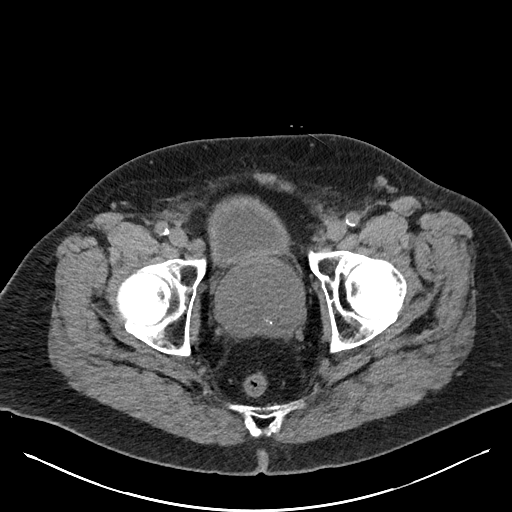
[im 37/97  soft-tissue]
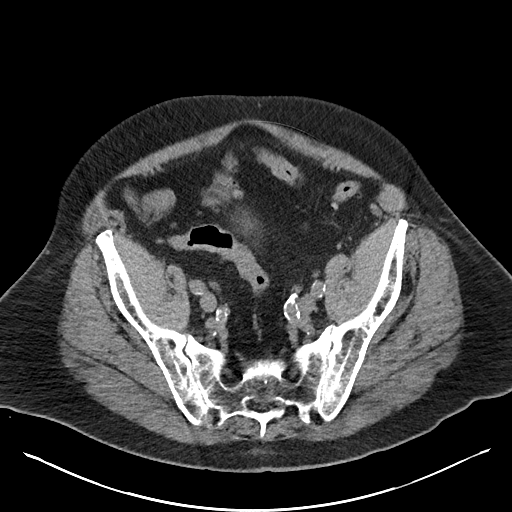
[im 49/97  soft-tissue]
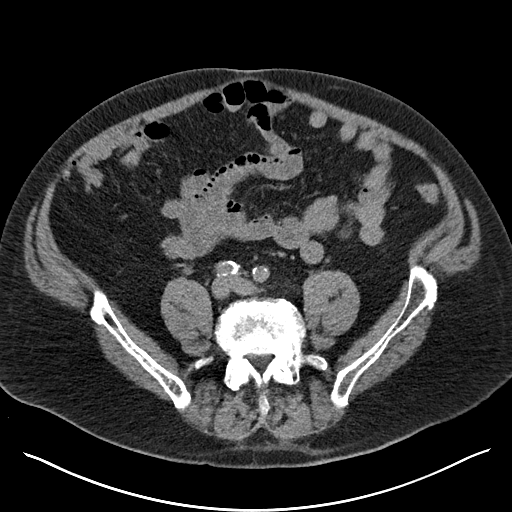
[im 49/97  lung]
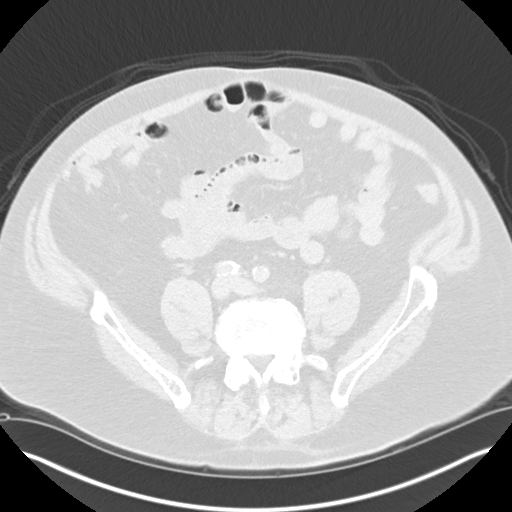
[im 61/97  soft-tissue]
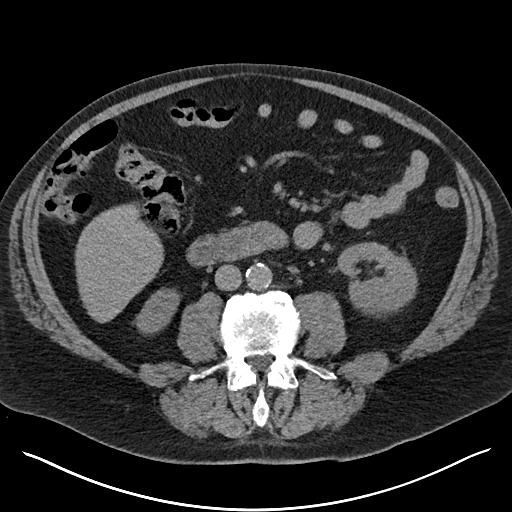
[im 61/97  lung]
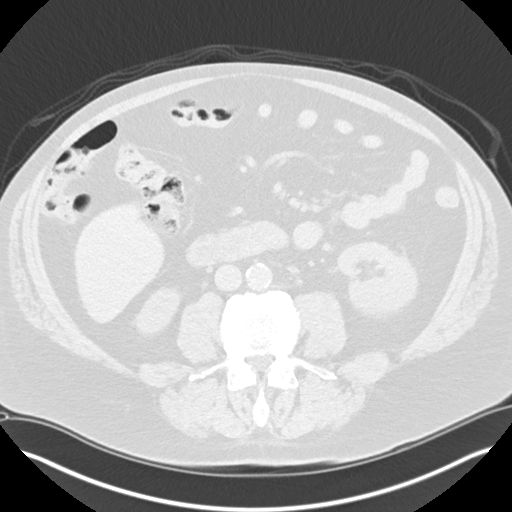
[im 73/97  soft-tissue]
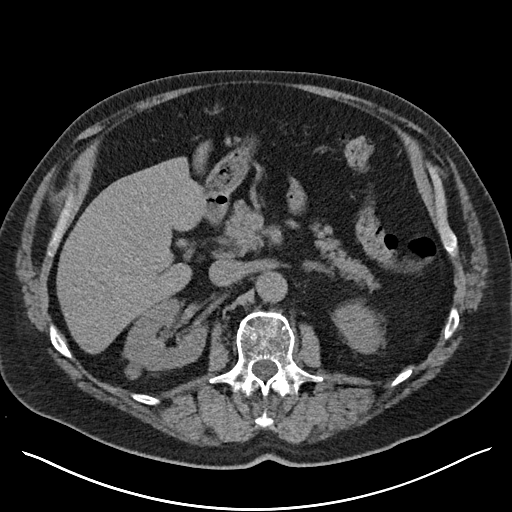
[im 73/97  lung]
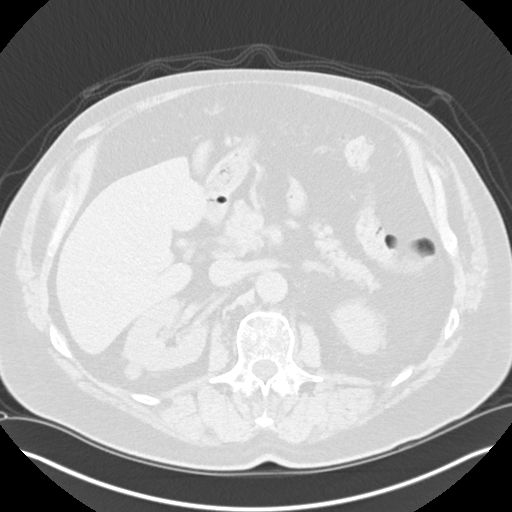
[im 85/97  soft-tissue]
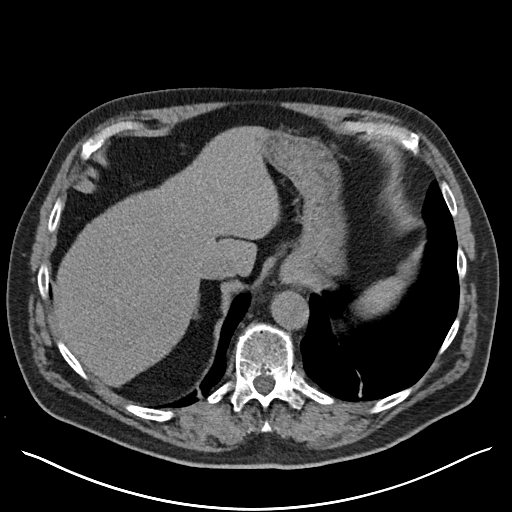
[im 85/97  lung]
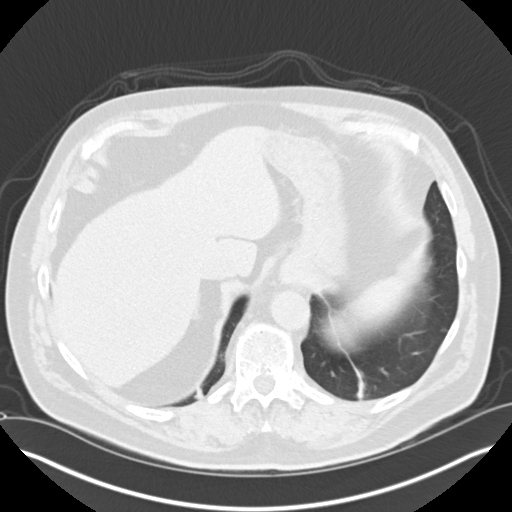

[Series 5: cor without without pre · coronal · non-contrast · 0.79mm/px · 2 of 166 slices shown, 3 images]
[im 56/166  soft-tissue]
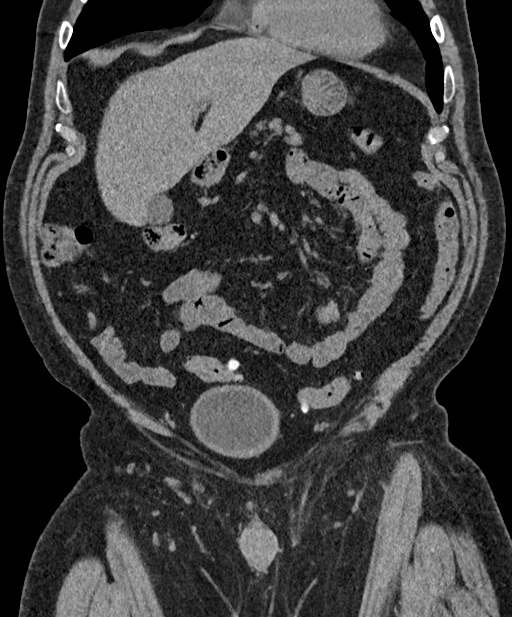
[im 56/166  bone]
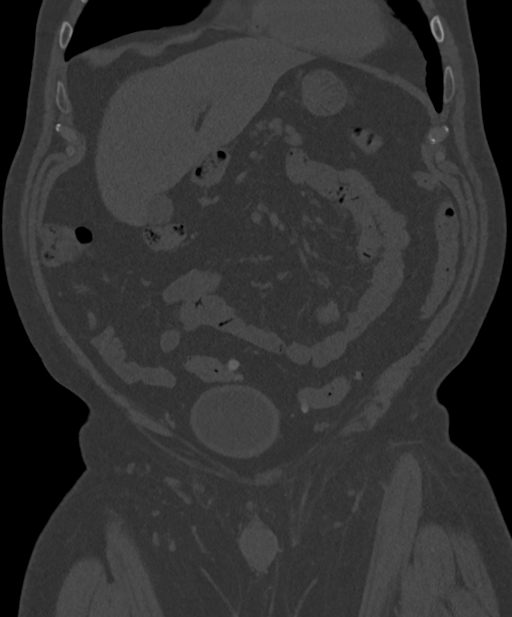
[im 111/166  soft-tissue]
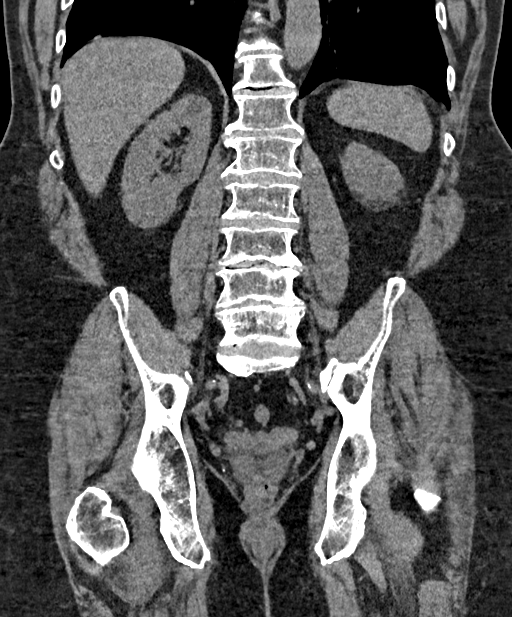

[Series 9: axial with hematuria with · axial · 0.79mm/px · z∈[-1549,-1479]mm · 2 of 97 slices shown]
[im 14/97  soft-tissue]
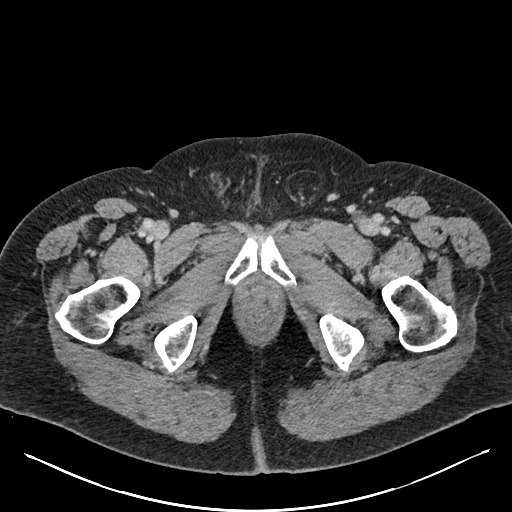
[im 28/97  soft-tissue]
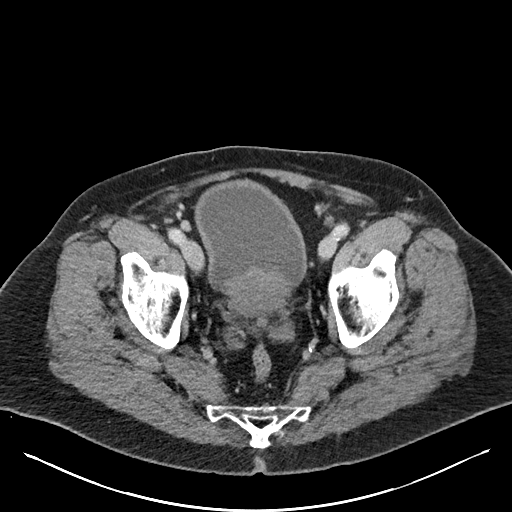

[11 of 46 positions shown; findings below may reference images not displayed]

FINDINGS: Lower chest: Multiple small pulmonary nodules are noted throughout
the visualized lung bases bilaterally measuring up to 4 mm (axial
image 4 of series 3).

Hepatobiliary: Multiple subcentimeter low-attenuation lesions
scattered throughout the liver, too small to characterize, but
statistically likely to represent tiny cysts. No other larger more
suspicious appearing hepatic lesions. No intra or extrahepatic
biliary ductal dilatation. Gallbladder is normal in appearance.

Pancreas: No pancreatic mass. No pancreatic ductal dilatation. No
pancreatic or peripancreatic fluid collections or inflammatory
changes.

Spleen: Unremarkable.

Adrenals/Urinary Tract: No calculi identified within the collecting
system of either kidney, along the course of either ureter, or
within the lumen of the urinary bladder. No hydroureteronephrosis.
In the right kidney there are two 1.4 cm low-attenuation
nonenhancing lesions in the interpolar and upper polar regions,
compatible with simple cysts. Left kidney and bilateral adrenal
glands are normal in appearance. On postcontrast delayed images
there are no definite filling defects within the collecting system
of either kidney, along the course of either ureter, or within the
lumen of the urinary bladder to strongly suggest the presence of
urothelial neoplasm at this time. Urinary bladder is normal in
appearance. Bilateral adrenal glands are normal in appearance.

Stomach/Bowel: Normal appearance of the stomach. No pathologic
dilatation of small bowel or colon. Numerous colonic diverticulae
are noted, particularly in the sigmoid colon, without surrounding
inflammatory changes to suggest an acute diverticulitis at this
time. Normal appendix.

Vascular/Lymphatic: Aortic atherosclerosis, without evidence of
aneurysm or dissection in the abdominal or pelvic vasculature. No
lymphadenopathy noted in the abdomen or pelvis.

Reproductive: Prostate gland is enlarged with median lobe
hypertrophy measuring 7.3 x 6.4 x 8.1 cm. Seminal vesicles are
unremarkable in appearance.

Other: No significant volume of ascites.  No pneumoperitoneum.

Musculoskeletal: There are no aggressive appearing lytic or blastic
lesions noted in the visualized portions of the skeleton.
IMPRESSION: 1. No explanation for the patient's history of hematuria.
Specifically, no urinary tract calculi no findings of urinary tract
obstruction.
2. Small low-attenuation nonenhancing lesions in the right kidney,
compatible with simple cysts. No suspicious renal lesions.
3. Severe prostatomegaly with median lobe hypertrophy.
4. Small pulmonary nodules in the visualized lung bases bilaterally
measuring up to 4 mm. These are nonspecific, but statistically
likely benign. No follow-up needed if patient is low-risk (and has
no known or suspected primary neoplasm). Non-contrast chest CT can
be considered in 12 months if patient is high-risk. This
recommendation follows the consensus statement: Guidelines for
Management of Incidental Pulmonary Nodules Detected on CT Images:
5. Colonic diverticulosis without evidence of acute diverticulitis
at this time.

## 2020-02-21 LAB — CULTURE, URINE COMPREHENSIVE

## 2020-02-24 ENCOUNTER — Ambulatory Visit (INDEPENDENT_AMBULATORY_CARE_PROVIDER_SITE_OTHER): Payer: Medicare HMO | Admitting: Urology

## 2020-02-24 ENCOUNTER — Other Ambulatory Visit: Payer: Self-pay

## 2020-02-24 ENCOUNTER — Encounter: Payer: Self-pay | Admitting: Urology

## 2020-02-24 VITALS — BP 144/80 | HR 85 | Ht 72.0 in | Wt 233.0 lb

## 2020-02-24 DIAGNOSIS — N138 Other obstructive and reflux uropathy: Secondary | ICD-10-CM | POA: Diagnosis not present

## 2020-02-24 DIAGNOSIS — N401 Enlarged prostate with lower urinary tract symptoms: Secondary | ICD-10-CM | POA: Diagnosis not present

## 2020-02-24 DIAGNOSIS — R972 Elevated prostate specific antigen [PSA]: Secondary | ICD-10-CM

## 2020-02-24 DIAGNOSIS — R339 Retention of urine, unspecified: Secondary | ICD-10-CM | POA: Diagnosis not present

## 2020-02-24 LAB — URINALYSIS, COMPLETE
Bilirubin, UA: NEGATIVE
Glucose, UA: NEGATIVE
Ketones, UA: NEGATIVE
Nitrite, UA: NEGATIVE
Protein,UA: NEGATIVE
Specific Gravity, UA: 1.01 (ref 1.005–1.030)
Urobilinogen, Ur: 0.2 mg/dL (ref 0.2–1.0)
pH, UA: 6 (ref 5.0–7.5)

## 2020-02-24 LAB — MICROSCOPIC EXAMINATION: WBC, UA: >30 /hpf — AB (ref 0–5)

## 2020-02-24 LAB — BLADDER SCAN AMB NON-IMAGING: Scan Result: 96

## 2020-02-24 NOTE — Progress Notes (Signed)
02/24/2020 11:55 AM   Casey Arias 1946/01/23 [id number]  Referring provider: Carlean Jews, NP [address] [place],  [place] [address]  Chief Complaint  Patient presents with  . Other    HPI: 74 year old male with a history of BPH/urinary obstruction status post holmium laser enucleation of the prostate on 01/05/2019 when he returns today for routine follow-up.  Preop prostate volume 198 with a median lobe.  Surgery complicated by clot retention requiring postoperative admission for CBI.  He is also had several urinary tract infections postoperatively.  He has been on multiple antibiotics prescribed by multiple prescribers including Bactrim, Macrobid to which she had a reaction and more recently amoxicillin.  He was prescribed amoxicillin for tooth ache.  He never took the second course of Bactrim.  Today he reports that he has been doing well.  He has not voided this well since he was 74 years old.  His stream is excellent.  He feels like he is emptying.  His dysuria is resolved.  He has no gross hematuria.  He does not feel like he has an infection today.  He is extremely pleased.  He is not on any BPH medications.  Surgical pathology consistent with benign prostatic glandular and stromal hyperplasia.  86 g resected.  PVR today 96  Preoperative PSA  7.8.  IPSS    Row Name 02/24/20 1100         International Prostate Symptom Score   How often have you had the sensation of not emptying your bladder? Less than 1 in 5     How often have you had to urinate less than every two hours? Less than 1 in 5 times     How often have you found you stopped and started again several times when you urinated? Not at All     How often have you found it difficult to postpone urination? Less than 1 in 5 times     How often have you had a weak urinary stream? Less than 1 in 5 times     How often have you had to strain to start urination? Less than 1 in 5 times     How many times did  you typically get up at night to urinate? 1 Time     Total IPSS Score 6       Quality of Life due to urinary symptoms   If you were to spend the rest of your life with your urinary condition just the way it is now how would you feel about that? Mostly Satisfied            Score:  1-7 Mild 8-19 Moderate 20-35 Severe    PMH:f Past Medical History:  Diagnosis Date  . COPD (chronic obstructive pulmonary disease) (HCC)   . Heart murmur   . Hyperlipidemia     Surgical History: Past Surgical History:  Procedure Laterality Date  . HERNIA REPAIR    . HOLEP-LASER ENUCLEATION OF THE PROSTATE WITH MORCELLATION N/A 01/05/2020   Procedure: HOLEP-LASER ENUCLEATION OF THE PROSTATE WITH MORCELLATION;  Surgeon: Vanna Scotland, MD;  Location: [facility] ORS;  Service: Urology;  Laterality: N/A;  . KNEE ARTHROSCOPY Left   . TONSILLECTOMY      Home Medications:  Allergies as of 02/24/2020      Reactions   Bee Pollen Anaphylaxis   Bee stings   Nitrofurantoin Monohyd Macro Diarrhea   Ciprofloxacin Rash      Medication List  Accurate as of February 24, 2020 11:55 AM. If you have any questions, ask your nurse or doctor.        STOP taking these medications   amoxicillin 875 MG tablet Commonly known as: AMOXIL Stopped by: Vanna Scotland, MD   docusate sodium 100 MG capsule Commonly known as: Colace Stopped by: Vanna Scotland, MD   nitrofurantoin (macrocrystal-monohydrate) 100 MG capsule Commonly known as: MACROBID Stopped by: Vanna Scotland, MD   phenazopyridine 200 MG tablet Commonly known as: PYRIDIUM Stopped by: Vanna Scotland, MD   sulfamethoxazole-trimethoprim 800-160 MG tablet Commonly known as: BACTRIM DS Stopped by: Vanna Scotland, MD     TAKE these medications   albuterol 108 (90 Base) MCG/ACT inhaler Commonly known as: Ventolin HFA Inhale 2 puffs every 4 to 6 hrs as needed for sob   chlorhexidine 0.12 % solution Commonly known as: PERIDEX 15 mLs 2 (two)  times daily.   EPINEPHrine 0.3 mg/0.3 mL Soaj injection Commonly known as: EpiPen 2-Pak Use as directed  as needed for anaphylaxis   HYDROcodone-acetaminophen 5-325 MG tablet Commonly known as: NORCO/VICODIN Take 1 tablet by mouth every 6 (six) hours as needed for moderate pain.   multivitamin with minerals tablet Take 1 tablet by mouth daily.   triamcinolone cream 0.1 % Commonly known as: KENALOG Apply to affected area twice a day as needed What changed:   how much to take  how to take this  when to take this  reasons to take this       Allergies:  Allergies  Allergen Reactions  . Bee Pollen Anaphylaxis    Bee stings  . Nitrofurantoin Monohyd Macro Diarrhea  . Ciprofloxacin Rash    Family History: Family History  Problem Relation Age of Onset  . Lymphoma Father   . Bladder Cancer Mother   . Prostate cancer Paternal Grandfather   . Kidney cancer Neg Hx     Social History:  reports that he has quit smoking. His smoking use included cigarettes. He has never used smokeless tobacco. He reports that he does not drink alcohol and does not use drugs.   Physical Exam: BP (!) 144/80   Pulse 85   Ht 6' (1.829 m)   Wt 233 lb (105.7 kg)   BMI 31.60 kg/m   Constitutional:  Alert and oriented, No acute distress. HEENT: [place] AT, moist mucus membranes.  Trachea midline, no masses. Cardiovascular: No clubbing, cyanosis, or edema. Respiratory: Normal respiratory effort, no increased work of breathing. Skin: No rashes, bruises or suspicious lesions. Neurologic: Grossly intact, no focal deficits, moving all 4 extremities. Psychiatric: Normal mood and affect.  Laboratory Data: Lab Results  Component Value Date   WBC 9.0 11/06/2017   HGB 10.4 (L) 01/07/2020   HCT 30.8 (L) 01/07/2020   MCV 87 11/06/2017   PLT 302 11/06/2017    Lab Results  Component Value Date   CREATININE 0.70 01/06/2020    Urinalysis Urine today with greater than 30 WBCs, 11-30 red blood  cells few bacteria, nitrate negative.  Pertinent Imaging: Results for orders placed or performed in visit on 02/24/20  BLADDER SCAN AMB NON-IMAGING  Result Value Ref Range   Scan Result 96 ML     Assessment & Plan:     1. Benign prostatic hyperplasia with urinary obstruction/ history of urinary retention Voiding reasonably well today with significantly lower PVR, 96 cc  Not currently on a BPH medication  IPSS as above, symptomatically improved  We will continue to follow him clinically  with PVR/IPSS at next visit  2. Elevated PSA In light of recent infections, will hold off on repeating PSA today  Plan to have him return in 3 months for PSA recheck, should be lower postoperatively, new baseline  3.  Recurrent urinary tract infections Has been treated on multiple occasions for presumed infection although most recent culture only grew 25 colonies of staph aureus ?  Contaminant Repeat urine culture today but only treat if he becomes symptomatic  Follow-up in 3 months with IPSS/PVR/PSA  Vanna Scotland, MD  [facility] [address] [place], [place] [address] [phone]

## 2020-02-27 LAB — CULTURE, URINE COMPREHENSIVE

## 2020-03-21 DIAGNOSIS — S02611B Fracture of condylar process of right mandible, initial encounter for open fracture: Secondary | ICD-10-CM | POA: Diagnosis not present

## 2020-03-21 DIAGNOSIS — E876 Hypokalemia: Secondary | ICD-10-CM | POA: Diagnosis not present

## 2020-03-21 DIAGNOSIS — S82852A Displaced trimalleolar fracture of left lower leg, initial encounter for closed fracture: Secondary | ICD-10-CM | POA: Diagnosis not present

## 2020-03-21 DIAGNOSIS — I700 Atherosclerosis of aorta: Secondary | ICD-10-CM | POA: Diagnosis not present

## 2020-03-21 DIAGNOSIS — R918 Other nonspecific abnormal finding of lung field: Secondary | ICD-10-CM | POA: Diagnosis not present

## 2020-03-21 DIAGNOSIS — S0242XA Fracture of alveolus of maxilla, initial encounter for closed fracture: Secondary | ICD-10-CM | POA: Diagnosis not present

## 2020-03-21 DIAGNOSIS — I517 Cardiomegaly: Secondary | ICD-10-CM | POA: Diagnosis not present

## 2020-03-21 DIAGNOSIS — N281 Cyst of kidney, acquired: Secondary | ICD-10-CM | POA: Diagnosis not present

## 2020-03-21 DIAGNOSIS — S82899A Other fracture of unspecified lower leg, initial encounter for closed fracture: Secondary | ICD-10-CM | POA: Diagnosis not present

## 2020-03-21 DIAGNOSIS — I6522 Occlusion and stenosis of left carotid artery: Secondary | ICD-10-CM | POA: Diagnosis not present

## 2020-03-21 DIAGNOSIS — S02671A Fracture of alveolus of right mandible, initial encounter for closed fracture: Secondary | ICD-10-CM | POA: Diagnosis not present

## 2020-03-21 DIAGNOSIS — S0302XA Dislocation of jaw, left side, initial encounter: Secondary | ICD-10-CM | POA: Diagnosis not present

## 2020-03-21 DIAGNOSIS — S6991XA Unspecified injury of right wrist, hand and finger(s), initial encounter: Secondary | ICD-10-CM | POA: Diagnosis not present

## 2020-03-21 DIAGNOSIS — S72402A Unspecified fracture of lower end of left femur, initial encounter for closed fracture: Secondary | ICD-10-CM | POA: Diagnosis not present

## 2020-03-21 DIAGNOSIS — S3993XA Unspecified injury of pelvis, initial encounter: Secondary | ICD-10-CM | POA: Diagnosis not present

## 2020-03-21 DIAGNOSIS — S299XXA Unspecified injury of thorax, initial encounter: Secondary | ICD-10-CM | POA: Diagnosis not present

## 2020-03-21 DIAGNOSIS — A4902 Methicillin resistant Staphylococcus aureus infection, unspecified site: Secondary | ICD-10-CM | POA: Diagnosis not present

## 2020-03-21 DIAGNOSIS — S72462A Displaced supracondylar fracture with intracondylar extension of lower end of left femur, initial encounter for closed fracture: Secondary | ICD-10-CM | POA: Diagnosis not present

## 2020-03-21 DIAGNOSIS — S82145A Nondisplaced bicondylar fracture of left tibia, initial encounter for closed fracture: Secondary | ICD-10-CM | POA: Diagnosis not present

## 2020-03-21 DIAGNOSIS — S82142A Displaced bicondylar fracture of left tibia, initial encounter for closed fracture: Secondary | ICD-10-CM | POA: Diagnosis not present

## 2020-03-21 DIAGNOSIS — I5022 Chronic systolic (congestive) heart failure: Secondary | ICD-10-CM | POA: Diagnosis not present

## 2020-03-21 DIAGNOSIS — J449 Chronic obstructive pulmonary disease, unspecified: Secondary | ICD-10-CM | POA: Diagnosis not present

## 2020-03-21 DIAGNOSIS — N390 Urinary tract infection, site not specified: Secondary | ICD-10-CM | POA: Diagnosis not present

## 2020-03-21 DIAGNOSIS — G8918 Other acute postprocedural pain: Secondary | ICD-10-CM | POA: Diagnosis not present

## 2020-03-21 DIAGNOSIS — S3992XA Unspecified injury of lower back, initial encounter: Secondary | ICD-10-CM | POA: Diagnosis not present

## 2020-03-21 DIAGNOSIS — S72402D Unspecified fracture of lower end of left femur, subsequent encounter for closed fracture with routine healing: Secondary | ICD-10-CM | POA: Diagnosis not present

## 2020-03-21 DIAGNOSIS — I6523 Occlusion and stenosis of bilateral carotid arteries: Secondary | ICD-10-CM | POA: Diagnosis not present

## 2020-03-21 DIAGNOSIS — N400 Enlarged prostate without lower urinary tract symptoms: Secondary | ICD-10-CM | POA: Diagnosis not present

## 2020-03-21 DIAGNOSIS — D72829 Elevated white blood cell count, unspecified: Secondary | ICD-10-CM | POA: Diagnosis not present

## 2020-03-21 DIAGNOSIS — R52 Pain, unspecified: Secondary | ICD-10-CM | POA: Diagnosis not present

## 2020-03-21 DIAGNOSIS — S82122A Displaced fracture of lateral condyle of left tibia, initial encounter for closed fracture: Secondary | ICD-10-CM | POA: Diagnosis not present

## 2020-03-21 DIAGNOSIS — I498 Other specified cardiac arrhythmias: Secondary | ICD-10-CM | POA: Diagnosis not present

## 2020-03-21 DIAGNOSIS — M25519 Pain in unspecified shoulder: Secondary | ICD-10-CM | POA: Diagnosis not present

## 2020-03-21 DIAGNOSIS — S72352A Displaced comminuted fracture of shaft of left femur, initial encounter for closed fracture: Secondary | ICD-10-CM | POA: Diagnosis not present

## 2020-03-21 DIAGNOSIS — R58 Hemorrhage, not elsewhere classified: Secondary | ICD-10-CM | POA: Diagnosis not present

## 2020-03-21 DIAGNOSIS — M25571 Pain in right ankle and joints of right foot: Secondary | ICD-10-CM | POA: Diagnosis not present

## 2020-03-21 DIAGNOSIS — Z20822 Contact with and (suspected) exposure to covid-19: Secondary | ICD-10-CM | POA: Diagnosis not present

## 2020-03-21 DIAGNOSIS — S82852D Displaced trimalleolar fracture of left lower leg, subsequent encounter for closed fracture with routine healing: Secondary | ICD-10-CM | POA: Diagnosis not present

## 2020-03-21 DIAGNOSIS — S0292XA Unspecified fracture of facial bones, initial encounter for closed fracture: Secondary | ICD-10-CM | POA: Diagnosis not present

## 2020-03-21 DIAGNOSIS — S72492A Other fracture of lower end of left femur, initial encounter for closed fracture: Secondary | ICD-10-CM | POA: Diagnosis not present

## 2020-03-21 DIAGNOSIS — S61401A Unspecified open wound of right hand, initial encounter: Secondary | ICD-10-CM | POA: Diagnosis not present

## 2020-03-21 DIAGNOSIS — S83112A Anterior subluxation of proximal end of tibia, left knee, initial encounter: Secondary | ICD-10-CM | POA: Diagnosis not present

## 2020-03-21 DIAGNOSIS — I6521 Occlusion and stenosis of right carotid artery: Secondary | ICD-10-CM | POA: Diagnosis not present

## 2020-03-21 DIAGNOSIS — S02611A Fracture of condylar process of right mandible, initial encounter for closed fracture: Secondary | ICD-10-CM | POA: Diagnosis not present

## 2020-03-21 DIAGNOSIS — S61401D Unspecified open wound of right hand, subsequent encounter: Secondary | ICD-10-CM | POA: Diagnosis not present

## 2020-03-21 DIAGNOSIS — S8262XD Displaced fracture of lateral malleolus of left fibula, subsequent encounter for closed fracture with routine healing: Secondary | ICD-10-CM | POA: Diagnosis not present

## 2020-03-21 DIAGNOSIS — I350 Nonrheumatic aortic (valve) stenosis: Secondary | ICD-10-CM | POA: Diagnosis not present

## 2020-03-21 DIAGNOSIS — S82142D Displaced bicondylar fracture of left tibia, subsequent encounter for closed fracture with routine healing: Secondary | ICD-10-CM | POA: Diagnosis not present

## 2020-03-21 DIAGNOSIS — S0990XA Unspecified injury of head, initial encounter: Secondary | ICD-10-CM | POA: Diagnosis not present

## 2020-03-21 DIAGNOSIS — R0602 Shortness of breath: Secondary | ICD-10-CM | POA: Diagnosis not present

## 2020-03-21 DIAGNOSIS — S199XXA Unspecified injury of neck, initial encounter: Secondary | ICD-10-CM | POA: Diagnosis not present

## 2020-03-21 DIAGNOSIS — S7292XA Unspecified fracture of left femur, initial encounter for closed fracture: Secondary | ICD-10-CM | POA: Diagnosis not present

## 2020-03-21 DIAGNOSIS — S61412A Laceration without foreign body of left hand, initial encounter: Secondary | ICD-10-CM | POA: Diagnosis not present

## 2020-03-21 DIAGNOSIS — Z7409 Other reduced mobility: Secondary | ICD-10-CM | POA: Diagnosis not present

## 2020-03-21 DIAGNOSIS — I493 Ventricular premature depolarization: Secondary | ICD-10-CM | POA: Diagnosis not present

## 2020-03-21 DIAGNOSIS — M19012 Primary osteoarthritis, left shoulder: Secondary | ICD-10-CM | POA: Diagnosis not present

## 2020-03-21 DIAGNOSIS — S81002A Unspecified open wound, left knee, initial encounter: Secondary | ICD-10-CM | POA: Diagnosis not present

## 2020-03-21 DIAGNOSIS — S02601A Fracture of unspecified part of body of right mandible, initial encounter for closed fracture: Secondary | ICD-10-CM | POA: Diagnosis not present

## 2020-03-21 DIAGNOSIS — I10 Essential (primary) hypertension: Secondary | ICD-10-CM | POA: Diagnosis not present

## 2020-03-21 DIAGNOSIS — G8911 Acute pain due to trauma: Secondary | ICD-10-CM | POA: Diagnosis not present

## 2020-03-21 DIAGNOSIS — S61411A Laceration without foreign body of right hand, initial encounter: Secondary | ICD-10-CM | POA: Diagnosis not present

## 2020-03-21 DIAGNOSIS — M47812 Spondylosis without myelopathy or radiculopathy, cervical region: Secondary | ICD-10-CM | POA: Diagnosis not present

## 2020-03-21 DIAGNOSIS — Z8781 Personal history of (healed) traumatic fracture: Secondary | ICD-10-CM | POA: Diagnosis not present

## 2020-03-21 DIAGNOSIS — S61411D Laceration without foreign body of right hand, subsequent encounter: Secondary | ICD-10-CM | POA: Diagnosis not present

## 2020-03-22 DIAGNOSIS — G8918 Other acute postprocedural pain: Secondary | ICD-10-CM | POA: Diagnosis not present

## 2020-03-22 DIAGNOSIS — S61411A Laceration without foreign body of right hand, initial encounter: Secondary | ICD-10-CM | POA: Diagnosis not present

## 2020-03-22 DIAGNOSIS — S82142A Displaced bicondylar fracture of left tibia, initial encounter for closed fracture: Secondary | ICD-10-CM | POA: Diagnosis not present

## 2020-03-22 DIAGNOSIS — S72462A Displaced supracondylar fracture with intracondylar extension of lower end of left femur, initial encounter for closed fracture: Secondary | ICD-10-CM | POA: Diagnosis not present

## 2020-03-22 DIAGNOSIS — S82852A Displaced trimalleolar fracture of left lower leg, initial encounter for closed fracture: Secondary | ICD-10-CM | POA: Diagnosis not present

## 2020-03-22 DIAGNOSIS — I6522 Occlusion and stenosis of left carotid artery: Secondary | ICD-10-CM | POA: Diagnosis not present

## 2020-03-22 DIAGNOSIS — S72402A Unspecified fracture of lower end of left femur, initial encounter for closed fracture: Secondary | ICD-10-CM | POA: Diagnosis not present

## 2020-03-23 DIAGNOSIS — M25571 Pain in right ankle and joints of right foot: Secondary | ICD-10-CM | POA: Diagnosis not present

## 2020-03-23 DIAGNOSIS — S61412A Laceration without foreign body of left hand, initial encounter: Secondary | ICD-10-CM | POA: Diagnosis not present

## 2020-03-23 DIAGNOSIS — S82142A Displaced bicondylar fracture of left tibia, initial encounter for closed fracture: Secondary | ICD-10-CM | POA: Diagnosis not present

## 2020-03-23 DIAGNOSIS — M19012 Primary osteoarthritis, left shoulder: Secondary | ICD-10-CM | POA: Diagnosis not present

## 2020-03-23 DIAGNOSIS — S8262XD Displaced fracture of lateral malleolus of left fibula, subsequent encounter for closed fracture with routine healing: Secondary | ICD-10-CM | POA: Diagnosis not present

## 2020-03-23 DIAGNOSIS — S72352A Displaced comminuted fracture of shaft of left femur, initial encounter for closed fracture: Secondary | ICD-10-CM | POA: Diagnosis not present

## 2020-03-23 DIAGNOSIS — S0292XA Unspecified fracture of facial bones, initial encounter for closed fracture: Secondary | ICD-10-CM | POA: Diagnosis not present

## 2020-03-24 DIAGNOSIS — S61412A Laceration without foreign body of left hand, initial encounter: Secondary | ICD-10-CM | POA: Diagnosis not present

## 2020-03-24 DIAGNOSIS — S82142A Displaced bicondylar fracture of left tibia, initial encounter for closed fracture: Secondary | ICD-10-CM | POA: Diagnosis not present

## 2020-03-24 DIAGNOSIS — A4902 Methicillin resistant Staphylococcus aureus infection, unspecified site: Secondary | ICD-10-CM | POA: Diagnosis not present

## 2020-03-24 DIAGNOSIS — S02611B Fracture of condylar process of right mandible, initial encounter for open fracture: Secondary | ICD-10-CM | POA: Diagnosis not present

## 2020-03-24 DIAGNOSIS — S7292XA Unspecified fracture of left femur, initial encounter for closed fracture: Secondary | ICD-10-CM | POA: Diagnosis not present

## 2020-03-25 DIAGNOSIS — S7292XA Unspecified fracture of left femur, initial encounter for closed fracture: Secondary | ICD-10-CM | POA: Diagnosis not present

## 2020-03-25 DIAGNOSIS — S82142A Displaced bicondylar fracture of left tibia, initial encounter for closed fracture: Secondary | ICD-10-CM | POA: Diagnosis not present

## 2020-03-25 DIAGNOSIS — S02611B Fracture of condylar process of right mandible, initial encounter for open fracture: Secondary | ICD-10-CM | POA: Diagnosis not present

## 2020-03-25 DIAGNOSIS — S61412A Laceration without foreign body of left hand, initial encounter: Secondary | ICD-10-CM | POA: Diagnosis not present

## 2020-03-25 DIAGNOSIS — A4902 Methicillin resistant Staphylococcus aureus infection, unspecified site: Secondary | ICD-10-CM | POA: Diagnosis not present

## 2020-03-26 DIAGNOSIS — S61412A Laceration without foreign body of left hand, initial encounter: Secondary | ICD-10-CM | POA: Diagnosis not present

## 2020-03-26 DIAGNOSIS — S82142A Displaced bicondylar fracture of left tibia, initial encounter for closed fracture: Secondary | ICD-10-CM | POA: Diagnosis not present

## 2020-03-27 DIAGNOSIS — I493 Ventricular premature depolarization: Secondary | ICD-10-CM | POA: Diagnosis not present

## 2020-03-27 DIAGNOSIS — S02601A Fracture of unspecified part of body of right mandible, initial encounter for closed fracture: Secondary | ICD-10-CM | POA: Diagnosis not present

## 2020-03-27 DIAGNOSIS — I498 Other specified cardiac arrhythmias: Secondary | ICD-10-CM | POA: Diagnosis not present

## 2020-03-27 DIAGNOSIS — I517 Cardiomegaly: Secondary | ICD-10-CM | POA: Diagnosis not present

## 2020-03-27 DIAGNOSIS — I6521 Occlusion and stenosis of right carotid artery: Secondary | ICD-10-CM | POA: Diagnosis not present

## 2020-03-27 DIAGNOSIS — I350 Nonrheumatic aortic (valve) stenosis: Secondary | ICD-10-CM | POA: Diagnosis not present

## 2020-03-27 DIAGNOSIS — S0242XA Fracture of alveolus of maxilla, initial encounter for closed fracture: Secondary | ICD-10-CM | POA: Diagnosis not present

## 2020-03-28 DIAGNOSIS — I498 Other specified cardiac arrhythmias: Secondary | ICD-10-CM | POA: Diagnosis not present

## 2020-03-28 DIAGNOSIS — S02601A Fracture of unspecified part of body of right mandible, initial encounter for closed fracture: Secondary | ICD-10-CM | POA: Diagnosis not present

## 2020-03-28 DIAGNOSIS — I6521 Occlusion and stenosis of right carotid artery: Secondary | ICD-10-CM | POA: Diagnosis not present

## 2020-03-28 DIAGNOSIS — S0242XA Fracture of alveolus of maxilla, initial encounter for closed fracture: Secondary | ICD-10-CM | POA: Diagnosis not present

## 2020-03-29 DIAGNOSIS — I493 Ventricular premature depolarization: Secondary | ICD-10-CM | POA: Diagnosis not present

## 2020-03-29 DIAGNOSIS — S02611B Fracture of condylar process of right mandible, initial encounter for open fracture: Secondary | ICD-10-CM | POA: Diagnosis not present

## 2020-03-29 DIAGNOSIS — S61412A Laceration without foreign body of left hand, initial encounter: Secondary | ICD-10-CM | POA: Diagnosis not present

## 2020-03-29 DIAGNOSIS — D72829 Elevated white blood cell count, unspecified: Secondary | ICD-10-CM | POA: Diagnosis not present

## 2020-03-29 DIAGNOSIS — S7292XA Unspecified fracture of left femur, initial encounter for closed fracture: Secondary | ICD-10-CM | POA: Diagnosis not present

## 2020-03-29 DIAGNOSIS — S82142A Displaced bicondylar fracture of left tibia, initial encounter for closed fracture: Secondary | ICD-10-CM | POA: Diagnosis not present

## 2020-03-30 DIAGNOSIS — S82142D Displaced bicondylar fracture of left tibia, subsequent encounter for closed fracture with routine healing: Secondary | ICD-10-CM | POA: Diagnosis not present

## 2020-03-30 DIAGNOSIS — S82852D Displaced trimalleolar fracture of left lower leg, subsequent encounter for closed fracture with routine healing: Secondary | ICD-10-CM | POA: Diagnosis not present

## 2020-03-30 DIAGNOSIS — S72402D Unspecified fracture of lower end of left femur, subsequent encounter for closed fracture with routine healing: Secondary | ICD-10-CM | POA: Diagnosis not present

## 2020-03-30 DIAGNOSIS — S61411D Laceration without foreign body of right hand, subsequent encounter: Secondary | ICD-10-CM | POA: Diagnosis not present

## 2020-04-01 DIAGNOSIS — Z8781 Personal history of (healed) traumatic fracture: Secondary | ICD-10-CM | POA: Diagnosis not present

## 2020-04-01 DIAGNOSIS — S61411D Laceration without foreign body of right hand, subsequent encounter: Secondary | ICD-10-CM | POA: Diagnosis not present

## 2020-04-01 DIAGNOSIS — S72402D Unspecified fracture of lower end of left femur, subsequent encounter for closed fracture with routine healing: Secondary | ICD-10-CM | POA: Diagnosis not present

## 2020-04-01 DIAGNOSIS — G8911 Acute pain due to trauma: Secondary | ICD-10-CM | POA: Diagnosis not present

## 2020-04-01 DIAGNOSIS — S02611B Fracture of condylar process of right mandible, initial encounter for open fracture: Secondary | ICD-10-CM | POA: Diagnosis not present

## 2020-04-01 DIAGNOSIS — S61401D Unspecified open wound of right hand, subsequent encounter: Secondary | ICD-10-CM | POA: Diagnosis not present

## 2020-04-01 DIAGNOSIS — S82852D Displaced trimalleolar fracture of left lower leg, subsequent encounter for closed fracture with routine healing: Secondary | ICD-10-CM | POA: Diagnosis not present

## 2020-04-01 DIAGNOSIS — Z7409 Other reduced mobility: Secondary | ICD-10-CM | POA: Diagnosis not present

## 2020-04-01 DIAGNOSIS — S82142D Displaced bicondylar fracture of left tibia, subsequent encounter for closed fracture with routine healing: Secondary | ICD-10-CM | POA: Diagnosis not present

## 2020-04-05 DIAGNOSIS — S02601D Fracture of unspecified part of body of right mandible, subsequent encounter for fracture with routine healing: Secondary | ICD-10-CM | POA: Diagnosis not present

## 2020-04-05 DIAGNOSIS — I5030 Unspecified diastolic (congestive) heart failure: Secondary | ICD-10-CM | POA: Diagnosis not present

## 2020-04-05 DIAGNOSIS — I5020 Unspecified systolic (congestive) heart failure: Secondary | ICD-10-CM | POA: Diagnosis not present

## 2020-04-05 DIAGNOSIS — S0302XD Dislocation of jaw, left side, subsequent encounter: Secondary | ICD-10-CM | POA: Diagnosis not present

## 2020-04-05 DIAGNOSIS — S02611A Fracture of condylar process of right mandible, initial encounter for closed fracture: Secondary | ICD-10-CM | POA: Diagnosis not present

## 2020-04-05 DIAGNOSIS — S02601A Fracture of unspecified part of body of right mandible, initial encounter for closed fracture: Secondary | ICD-10-CM | POA: Diagnosis not present

## 2020-04-05 DIAGNOSIS — S82852D Displaced trimalleolar fracture of left lower leg, subsequent encounter for closed fracture with routine healing: Secondary | ICD-10-CM | POA: Diagnosis not present

## 2020-04-05 DIAGNOSIS — S02671D Fracture of alveolus of right mandible, subsequent encounter for fracture with routine healing: Secondary | ICD-10-CM | POA: Diagnosis not present

## 2020-04-05 DIAGNOSIS — S82142D Displaced bicondylar fracture of left tibia, subsequent encounter for closed fracture with routine healing: Secondary | ICD-10-CM | POA: Diagnosis not present

## 2020-04-05 DIAGNOSIS — S82125D Nondisplaced fracture of lateral condyle of left tibia, subsequent encounter for closed fracture with routine healing: Secondary | ICD-10-CM | POA: Diagnosis not present

## 2020-04-05 DIAGNOSIS — S72402D Unspecified fracture of lower end of left femur, subsequent encounter for closed fracture with routine healing: Secondary | ICD-10-CM | POA: Diagnosis not present

## 2020-04-05 DIAGNOSIS — S02611D Fracture of condylar process of right mandible, subsequent encounter for fracture with routine healing: Secondary | ICD-10-CM | POA: Diagnosis not present

## 2020-04-05 DIAGNOSIS — J449 Chronic obstructive pulmonary disease, unspecified: Secondary | ICD-10-CM | POA: Diagnosis not present

## 2020-04-05 DIAGNOSIS — S82892A Other fracture of left lower leg, initial encounter for closed fracture: Secondary | ICD-10-CM | POA: Diagnosis not present

## 2020-04-05 DIAGNOSIS — S61411D Laceration without foreign body of right hand, subsequent encounter: Secondary | ICD-10-CM | POA: Diagnosis not present

## 2020-04-05 DIAGNOSIS — S72352D Displaced comminuted fracture of shaft of left femur, subsequent encounter for closed fracture with routine healing: Secondary | ICD-10-CM | POA: Diagnosis not present

## 2020-04-05 DIAGNOSIS — I6521 Occlusion and stenosis of right carotid artery: Secondary | ICD-10-CM | POA: Diagnosis not present

## 2020-04-05 DIAGNOSIS — S0191XD Laceration without foreign body of unspecified part of head, subsequent encounter: Secondary | ICD-10-CM | POA: Diagnosis not present

## 2020-04-05 DIAGNOSIS — S82142A Displaced bicondylar fracture of left tibia, initial encounter for closed fracture: Secondary | ICD-10-CM | POA: Diagnosis not present

## 2020-04-06 DIAGNOSIS — S82142D Displaced bicondylar fracture of left tibia, subsequent encounter for closed fracture with routine healing: Secondary | ICD-10-CM | POA: Diagnosis not present

## 2020-04-06 DIAGNOSIS — S72402D Unspecified fracture of lower end of left femur, subsequent encounter for closed fracture with routine healing: Secondary | ICD-10-CM | POA: Diagnosis not present

## 2020-04-06 DIAGNOSIS — I6521 Occlusion and stenosis of right carotid artery: Secondary | ICD-10-CM | POA: Diagnosis not present

## 2020-04-06 DIAGNOSIS — S61411D Laceration without foreign body of right hand, subsequent encounter: Secondary | ICD-10-CM | POA: Diagnosis not present

## 2020-04-06 DIAGNOSIS — I5020 Unspecified systolic (congestive) heart failure: Secondary | ICD-10-CM | POA: Diagnosis not present

## 2020-04-06 DIAGNOSIS — S0191XD Laceration without foreign body of unspecified part of head, subsequent encounter: Secondary | ICD-10-CM | POA: Diagnosis not present

## 2020-04-06 DIAGNOSIS — S82852D Displaced trimalleolar fracture of left lower leg, subsequent encounter for closed fracture with routine healing: Secondary | ICD-10-CM | POA: Diagnosis not present

## 2020-04-07 DIAGNOSIS — S61411D Laceration without foreign body of right hand, subsequent encounter: Secondary | ICD-10-CM | POA: Diagnosis not present

## 2020-04-07 DIAGNOSIS — S0191XD Laceration without foreign body of unspecified part of head, subsequent encounter: Secondary | ICD-10-CM | POA: Diagnosis not present

## 2020-04-07 DIAGNOSIS — S72402D Unspecified fracture of lower end of left femur, subsequent encounter for closed fracture with routine healing: Secondary | ICD-10-CM | POA: Diagnosis not present

## 2020-04-07 DIAGNOSIS — S82852D Displaced trimalleolar fracture of left lower leg, subsequent encounter for closed fracture with routine healing: Secondary | ICD-10-CM | POA: Diagnosis not present

## 2020-04-07 DIAGNOSIS — I6521 Occlusion and stenosis of right carotid artery: Secondary | ICD-10-CM | POA: Diagnosis not present

## 2020-04-07 DIAGNOSIS — S82142D Displaced bicondylar fracture of left tibia, subsequent encounter for closed fracture with routine healing: Secondary | ICD-10-CM | POA: Diagnosis not present

## 2020-04-07 DIAGNOSIS — I5020 Unspecified systolic (congestive) heart failure: Secondary | ICD-10-CM | POA: Diagnosis not present

## 2020-04-08 DIAGNOSIS — I5020 Unspecified systolic (congestive) heart failure: Secondary | ICD-10-CM | POA: Diagnosis not present

## 2020-04-08 DIAGNOSIS — S72402D Unspecified fracture of lower end of left femur, subsequent encounter for closed fracture with routine healing: Secondary | ICD-10-CM | POA: Diagnosis not present

## 2020-04-08 DIAGNOSIS — I6521 Occlusion and stenosis of right carotid artery: Secondary | ICD-10-CM | POA: Diagnosis not present

## 2020-04-08 DIAGNOSIS — S0191XD Laceration without foreign body of unspecified part of head, subsequent encounter: Secondary | ICD-10-CM | POA: Diagnosis not present

## 2020-04-08 DIAGNOSIS — S82852D Displaced trimalleolar fracture of left lower leg, subsequent encounter for closed fracture with routine healing: Secondary | ICD-10-CM | POA: Diagnosis not present

## 2020-04-08 DIAGNOSIS — S61411D Laceration without foreign body of right hand, subsequent encounter: Secondary | ICD-10-CM | POA: Diagnosis not present

## 2020-04-08 DIAGNOSIS — S82142D Displaced bicondylar fracture of left tibia, subsequent encounter for closed fracture with routine healing: Secondary | ICD-10-CM | POA: Diagnosis not present

## 2020-04-09 DIAGNOSIS — S0191XD Laceration without foreign body of unspecified part of head, subsequent encounter: Secondary | ICD-10-CM | POA: Diagnosis not present

## 2020-04-09 DIAGNOSIS — S82142D Displaced bicondylar fracture of left tibia, subsequent encounter for closed fracture with routine healing: Secondary | ICD-10-CM | POA: Diagnosis not present

## 2020-04-09 DIAGNOSIS — S82852D Displaced trimalleolar fracture of left lower leg, subsequent encounter for closed fracture with routine healing: Secondary | ICD-10-CM | POA: Diagnosis not present

## 2020-04-09 DIAGNOSIS — I5020 Unspecified systolic (congestive) heart failure: Secondary | ICD-10-CM | POA: Diagnosis not present

## 2020-04-09 DIAGNOSIS — S61411D Laceration without foreign body of right hand, subsequent encounter: Secondary | ICD-10-CM | POA: Diagnosis not present

## 2020-04-09 DIAGNOSIS — I6521 Occlusion and stenosis of right carotid artery: Secondary | ICD-10-CM | POA: Diagnosis not present

## 2020-04-09 DIAGNOSIS — S72402D Unspecified fracture of lower end of left femur, subsequent encounter for closed fracture with routine healing: Secondary | ICD-10-CM | POA: Diagnosis not present

## 2020-04-10 DIAGNOSIS — I5020 Unspecified systolic (congestive) heart failure: Secondary | ICD-10-CM | POA: Diagnosis not present

## 2020-04-10 DIAGNOSIS — S61411D Laceration without foreign body of right hand, subsequent encounter: Secondary | ICD-10-CM | POA: Diagnosis not present

## 2020-04-10 DIAGNOSIS — S82142D Displaced bicondylar fracture of left tibia, subsequent encounter for closed fracture with routine healing: Secondary | ICD-10-CM | POA: Diagnosis not present

## 2020-04-10 DIAGNOSIS — S82852D Displaced trimalleolar fracture of left lower leg, subsequent encounter for closed fracture with routine healing: Secondary | ICD-10-CM | POA: Diagnosis not present

## 2020-04-10 DIAGNOSIS — I6521 Occlusion and stenosis of right carotid artery: Secondary | ICD-10-CM | POA: Diagnosis not present

## 2020-04-10 DIAGNOSIS — S72402D Unspecified fracture of lower end of left femur, subsequent encounter for closed fracture with routine healing: Secondary | ICD-10-CM | POA: Diagnosis not present

## 2020-04-10 DIAGNOSIS — S0191XD Laceration without foreign body of unspecified part of head, subsequent encounter: Secondary | ICD-10-CM | POA: Diagnosis not present

## 2020-04-11 DIAGNOSIS — S72402D Unspecified fracture of lower end of left femur, subsequent encounter for closed fracture with routine healing: Secondary | ICD-10-CM | POA: Diagnosis not present

## 2020-04-11 DIAGNOSIS — S0191XD Laceration without foreign body of unspecified part of head, subsequent encounter: Secondary | ICD-10-CM | POA: Diagnosis not present

## 2020-04-11 DIAGNOSIS — I5020 Unspecified systolic (congestive) heart failure: Secondary | ICD-10-CM | POA: Diagnosis not present

## 2020-04-11 DIAGNOSIS — S82852D Displaced trimalleolar fracture of left lower leg, subsequent encounter for closed fracture with routine healing: Secondary | ICD-10-CM | POA: Diagnosis not present

## 2020-04-11 DIAGNOSIS — I6521 Occlusion and stenosis of right carotid artery: Secondary | ICD-10-CM | POA: Diagnosis not present

## 2020-04-11 DIAGNOSIS — S61411D Laceration without foreign body of right hand, subsequent encounter: Secondary | ICD-10-CM | POA: Diagnosis not present

## 2020-04-11 DIAGNOSIS — S82142D Displaced bicondylar fracture of left tibia, subsequent encounter for closed fracture with routine healing: Secondary | ICD-10-CM | POA: Diagnosis not present

## 2020-04-12 DIAGNOSIS — S61411D Laceration without foreign body of right hand, subsequent encounter: Secondary | ICD-10-CM | POA: Diagnosis not present

## 2020-04-12 DIAGNOSIS — I6521 Occlusion and stenosis of right carotid artery: Secondary | ICD-10-CM | POA: Diagnosis not present

## 2020-04-12 DIAGNOSIS — S72402D Unspecified fracture of lower end of left femur, subsequent encounter for closed fracture with routine healing: Secondary | ICD-10-CM | POA: Diagnosis not present

## 2020-04-12 DIAGNOSIS — I5020 Unspecified systolic (congestive) heart failure: Secondary | ICD-10-CM | POA: Diagnosis not present

## 2020-04-12 DIAGNOSIS — S0191XD Laceration without foreign body of unspecified part of head, subsequent encounter: Secondary | ICD-10-CM | POA: Diagnosis not present

## 2020-04-12 DIAGNOSIS — S82142D Displaced bicondylar fracture of left tibia, subsequent encounter for closed fracture with routine healing: Secondary | ICD-10-CM | POA: Diagnosis not present

## 2020-04-12 DIAGNOSIS — S82852D Displaced trimalleolar fracture of left lower leg, subsequent encounter for closed fracture with routine healing: Secondary | ICD-10-CM | POA: Diagnosis not present

## 2020-04-13 DIAGNOSIS — S0191XD Laceration without foreign body of unspecified part of head, subsequent encounter: Secondary | ICD-10-CM | POA: Diagnosis not present

## 2020-04-13 DIAGNOSIS — S82142D Displaced bicondylar fracture of left tibia, subsequent encounter for closed fracture with routine healing: Secondary | ICD-10-CM | POA: Diagnosis not present

## 2020-04-13 DIAGNOSIS — I6521 Occlusion and stenosis of right carotid artery: Secondary | ICD-10-CM | POA: Diagnosis not present

## 2020-04-13 DIAGNOSIS — S82852D Displaced trimalleolar fracture of left lower leg, subsequent encounter for closed fracture with routine healing: Secondary | ICD-10-CM | POA: Diagnosis not present

## 2020-04-13 DIAGNOSIS — S61411D Laceration without foreign body of right hand, subsequent encounter: Secondary | ICD-10-CM | POA: Diagnosis not present

## 2020-04-13 DIAGNOSIS — I5020 Unspecified systolic (congestive) heart failure: Secondary | ICD-10-CM | POA: Diagnosis not present

## 2020-04-13 DIAGNOSIS — S72402D Unspecified fracture of lower end of left femur, subsequent encounter for closed fracture with routine healing: Secondary | ICD-10-CM | POA: Diagnosis not present

## 2020-04-14 DIAGNOSIS — S82892A Other fracture of left lower leg, initial encounter for closed fracture: Secondary | ICD-10-CM | POA: Diagnosis not present

## 2020-04-14 DIAGNOSIS — S0191XD Laceration without foreign body of unspecified part of head, subsequent encounter: Secondary | ICD-10-CM | POA: Diagnosis not present

## 2020-04-14 DIAGNOSIS — S72402D Unspecified fracture of lower end of left femur, subsequent encounter for closed fracture with routine healing: Secondary | ICD-10-CM | POA: Diagnosis not present

## 2020-04-14 DIAGNOSIS — I5020 Unspecified systolic (congestive) heart failure: Secondary | ICD-10-CM | POA: Diagnosis not present

## 2020-04-14 DIAGNOSIS — S82852D Displaced trimalleolar fracture of left lower leg, subsequent encounter for closed fracture with routine healing: Secondary | ICD-10-CM | POA: Diagnosis not present

## 2020-04-14 DIAGNOSIS — S61411D Laceration without foreign body of right hand, subsequent encounter: Secondary | ICD-10-CM | POA: Diagnosis not present

## 2020-04-14 DIAGNOSIS — I6521 Occlusion and stenosis of right carotid artery: Secondary | ICD-10-CM | POA: Diagnosis not present

## 2020-04-14 DIAGNOSIS — S82142A Displaced bicondylar fracture of left tibia, initial encounter for closed fracture: Secondary | ICD-10-CM | POA: Diagnosis not present

## 2020-04-14 DIAGNOSIS — S82142D Displaced bicondylar fracture of left tibia, subsequent encounter for closed fracture with routine healing: Secondary | ICD-10-CM | POA: Diagnosis not present

## 2020-04-15 DIAGNOSIS — I6521 Occlusion and stenosis of right carotid artery: Secondary | ICD-10-CM | POA: Diagnosis not present

## 2020-04-15 DIAGNOSIS — S0191XD Laceration without foreign body of unspecified part of head, subsequent encounter: Secondary | ICD-10-CM | POA: Diagnosis not present

## 2020-04-15 DIAGNOSIS — I5020 Unspecified systolic (congestive) heart failure: Secondary | ICD-10-CM | POA: Diagnosis not present

## 2020-04-15 DIAGNOSIS — S61411D Laceration without foreign body of right hand, subsequent encounter: Secondary | ICD-10-CM | POA: Diagnosis not present

## 2020-04-15 DIAGNOSIS — S82852D Displaced trimalleolar fracture of left lower leg, subsequent encounter for closed fracture with routine healing: Secondary | ICD-10-CM | POA: Diagnosis not present

## 2020-04-15 DIAGNOSIS — S82142D Displaced bicondylar fracture of left tibia, subsequent encounter for closed fracture with routine healing: Secondary | ICD-10-CM | POA: Diagnosis not present

## 2020-04-15 DIAGNOSIS — S72402D Unspecified fracture of lower end of left femur, subsequent encounter for closed fracture with routine healing: Secondary | ICD-10-CM | POA: Diagnosis not present

## 2020-04-27 DIAGNOSIS — I493 Ventricular premature depolarization: Secondary | ICD-10-CM | POA: Diagnosis not present

## 2020-04-29 DIAGNOSIS — I493 Ventricular premature depolarization: Secondary | ICD-10-CM | POA: Diagnosis not present

## 2020-05-10 DIAGNOSIS — I6523 Occlusion and stenosis of bilateral carotid arteries: Secondary | ICD-10-CM | POA: Diagnosis not present

## 2020-05-12 DIAGNOSIS — S8255XD Nondisplaced fracture of medial malleolus of left tibia, subsequent encounter for closed fracture with routine healing: Secondary | ICD-10-CM | POA: Diagnosis not present

## 2020-05-12 DIAGNOSIS — S72402D Unspecified fracture of lower end of left femur, subsequent encounter for closed fracture with routine healing: Secondary | ICD-10-CM | POA: Diagnosis not present

## 2020-05-12 DIAGNOSIS — S82842D Displaced bimalleolar fracture of left lower leg, subsequent encounter for closed fracture with routine healing: Secondary | ICD-10-CM | POA: Diagnosis not present

## 2020-05-12 DIAGNOSIS — S82852D Displaced trimalleolar fracture of left lower leg, subsequent encounter for closed fracture with routine healing: Secondary | ICD-10-CM | POA: Diagnosis not present

## 2020-05-15 DIAGNOSIS — S82892A Other fracture of left lower leg, initial encounter for closed fracture: Secondary | ICD-10-CM | POA: Diagnosis not present

## 2020-05-15 DIAGNOSIS — S82142A Displaced bicondylar fracture of left tibia, initial encounter for closed fracture: Secondary | ICD-10-CM | POA: Diagnosis not present

## 2020-05-24 ENCOUNTER — Other Ambulatory Visit: Payer: Self-pay

## 2020-05-24 ENCOUNTER — Encounter: Payer: Self-pay | Admitting: Hospice and Palliative Medicine

## 2020-05-24 ENCOUNTER — Ambulatory Visit (INDEPENDENT_AMBULATORY_CARE_PROVIDER_SITE_OTHER): Payer: Medicare HMO | Admitting: Hospice and Palliative Medicine

## 2020-05-24 DIAGNOSIS — I5020 Unspecified systolic (congestive) heart failure: Secondary | ICD-10-CM | POA: Diagnosis not present

## 2020-05-24 DIAGNOSIS — S02670S Fracture of alveolus of mandible, unspecified side, sequela: Secondary | ICD-10-CM

## 2020-05-24 DIAGNOSIS — I10 Essential (primary) hypertension: Secondary | ICD-10-CM | POA: Diagnosis not present

## 2020-05-24 DIAGNOSIS — I6521 Occlusion and stenosis of right carotid artery: Secondary | ICD-10-CM

## 2020-05-24 MED ORDER — METOPROLOL SUCCINATE ER 25 MG PO TB24: 25.0000 mg | ORAL_TABLET | Freq: Every day | ORAL | 1 refills | Status: DC

## 2020-05-24 MED ORDER — ATORVASTATIN CALCIUM 20 MG PO TABS: 20.0000 mg | ORAL_TABLET | Freq: Every day | ORAL | 1 refills | Status: DC

## 2020-05-24 MED ORDER — LOSARTAN POTASSIUM 25 MG PO TABS: 25.0000 mg | ORAL_TABLET | Freq: Every day | ORAL | 1 refills | Status: DC

## 2020-05-24 MED ORDER — CHLORHEXIDINE GLUCONATE 0.12 % MT SOLN: 15.0000 mL | Freq: Two times a day (BID) | OROMUCOSAL | 1 refills | Status: DC
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
## 2020-05-26 ENCOUNTER — Ambulatory Visit: Payer: Self-pay | Admitting: Urology

## 2020-05-27 ENCOUNTER — Encounter: Payer: Self-pay | Admitting: Hospice and Palliative Medicine

## 2020-05-27 NOTE — Patient Instructions (Signed)
Carotid Artery Disease  Carotid artery disease, also called carotid artery stenosis, is the narrowing or blockage of one or both carotid arteries. The carotid arteries are the two main blood vessels on either side of the neck. They supply blood to the brain, other parts of the head, and the neck. Carotid artery disease increases your risk for a stroke or a transient ischemic attack (TIA). A TIA is a "mini-stroke" that causes stroke-like symptoms that then go away quickly. What are the causes? This condition is mainly caused by a narrowing and hardening of the carotid arteries (atherosclerosis). The carotid arteries can become narrow or clogged with a buildup of fat, cholesterol, calcium, and other substances (plaque). What increases the risk? The following factors may make you more likely to develop this condition:  Having certain medical conditions, such as: ? High cholesterol. ? High blood pressure (hypertension). ? Diabetes. ? Obesity.  Smoking.  A family history of cardiovascular disease.  Inactivity or lack of regular exercise.  Being male. Men have an increased risk of developing atherosclerosis earlier in life than women.  Old age. What are the signs or symptoms? This condition may not have any signs or symptoms until a stroke or TIA occurs. In some cases, your health care provider may be able to hear a whooshing sound (bruit). This can indicate a change in blood flow caused by plaque buildup. An eye exam can also help identify signs of the condition. How is this diagnosed? This condition may be diagnosed with a physical exam, your medical history, and your family's medical history. You may also have tests that look at the blood flow in your carotid arteries, such as:  Carotid artery ultrasound, which uses sound waves to create pictures to show if the arteries are narrow or blocked.  Tests that use a dye injected into a vein to highlight your arteries on images, such  as: ? Carotid or cerebral angiography, which uses X-rays. ? Computerized tomographic angiography (CTA), which uses CT scans. ? Magnetic resonance angiography (MRA), which uses MRI. How is this treated? This condition may be treated with a combination of treatments. Treatment options include:  Lifestyle changes, such as: ? Quitting smoking. ? Exercising regularly or as told by your health care provider. ? Eating a heart-healthy diet. ? Managing stress. ? Maintaining a healthy weight.  Medicines to control blood pressure, cholesterol, and blood clotting.  Surgery. You may have: ? A carotid endarterectomy. This is a surgery to remove the blockages in the carotid arteries. ? A carotid angioplasty with stenting. This is a procedure in which a small mesh tube (stent) is used to widen the blocked carotid arteries. Follow these instructions at home: Eating and drinking Follow instructions about your diet from your health care provider. It is important to:  Eat a healthy diet that is low in saturated fats and includes plenty of fresh fruits, vegetables, and lean meats.  Avoid foods that are high in fat and salt (sodium).  Avoid foods that are fried, overly processed, or have poor nutritional value.  Lifestyle   Maintain a healthy weight.  Do exercises as told by your health care provider to stay physically active. It is recommended that each week you get at least 150 minutes of moderate-intensity exercise or 75 minutes of exercise that takes a lot of effort.  Do not use any products that contain nicotine or tobacco, such as cigarettes, e-cigarettes, and chewing tobacco. If you need help quitting, ask your health care provider.    Do not drink alcohol if: ? Your health care provider tells you not to drink. ? You are pregnant, may be pregnant, or are planning to become pregnant.  If you drink alcohol: ? Limit how much you use to:  0-1 drink a day for women.  0-2 drinks a day for  men. ? Be aware of how much alcohol is in your drink. In the [place] one drink equals one 12 oz bottle of beer (355 mL), one 5 oz glass of wine (148 mL), or one 1 oz glass of hard liquor (44 mL).  Do not use drugs.  Manage your stress. Ask your health care provider for stress management tips. General instructions  Take over-the-counter and prescription medicines only as told by your health care provider.  Keep all follow-up visits as told by your health care provider. This is important. Where to find more information  American Heart Association: www.heart.org Get help right away if:  You have any symptoms of a stroke. "BE FAST" is an easy way to remember the main warning signs of a stroke: ? B - Balance. Signs are dizziness, sudden trouble walking, or loss of balance. ? E - Eyes. Signs are trouble seeing or a sudden change in vision. ? F - Face. Signs are sudden weakness or numbness of the face, or the face or eyelid drooping on one side. ? A - Arms. Signs are weakness or numbness in an arm. This happens suddenly and usually on one side of the body. ? S - Speech. Signs are sudden trouble speaking, slurred speech, or trouble understanding what people say. ? T - Time. Time to call emergency services. Write down what time symptoms started.  You have other signs of a stroke, such as: ? A sudden, severe headache with no known cause. ? Nausea or vomiting. ? Seizure. These symptoms may represent a serious problem that is an emergency. Do not wait to see if the symptoms will go away. Get medical help right away. Call your local emergency services (911 in the [place] Do not drive yourself to the hospital. Summary  Carotid artery disease, also called carotid artery stenosis, is the narrowing or blockage of one or both carotid arteries.  Carotid artery disease increases your risk for a stroke or a transient ischemic attack (TIA).  This condition can be treated with lifestyle changes, medicines,  surgery, or a combination of these treatments.  Get help right away if you have any symptoms of stroke. The acronym BEFAST is an easy way to remember the main warning signs of stroke. This information is not intended to replace advice given to you by your health care provider. Make sure you discuss any questions you have with your health care provider. Document Revised: 12/23/2018 Document Reviewed: 12/23/2018 Elsevier Patient Education  2020 Elsevier Inc.  

## 2020-06-14 DIAGNOSIS — S82892A Other fracture of left lower leg, initial encounter for closed fracture: Secondary | ICD-10-CM | POA: Diagnosis not present

## 2020-06-14 DIAGNOSIS — S82142A Displaced bicondylar fracture of left tibia, initial encounter for closed fracture: Secondary | ICD-10-CM | POA: Diagnosis not present

## 2020-06-16 DIAGNOSIS — S72492D Other fracture of lower end of left femur, subsequent encounter for closed fracture with routine healing: Secondary | ICD-10-CM | POA: Diagnosis not present

## 2020-06-16 DIAGNOSIS — S7292XD Unspecified fracture of left femur, subsequent encounter for closed fracture with routine healing: Secondary | ICD-10-CM | POA: Diagnosis not present

## 2020-06-16 DIAGNOSIS — X58XXXD Exposure to other specified factors, subsequent encounter: Secondary | ICD-10-CM | POA: Diagnosis not present

## 2020-06-16 DIAGNOSIS — S72352D Displaced comminuted fracture of shaft of left femur, subsequent encounter for closed fracture with routine healing: Secondary | ICD-10-CM | POA: Diagnosis not present

## 2020-06-16 DIAGNOSIS — S72402D Unspecified fracture of lower end of left femur, subsequent encounter for closed fracture with routine healing: Secondary | ICD-10-CM | POA: Diagnosis not present

## 2020-06-16 DIAGNOSIS — S82892D Other fracture of left lower leg, subsequent encounter for closed fracture with routine healing: Secondary | ICD-10-CM | POA: Diagnosis not present

## 2020-06-16 DIAGNOSIS — S82852D Displaced trimalleolar fracture of left lower leg, subsequent encounter for closed fracture with routine healing: Secondary | ICD-10-CM | POA: Diagnosis not present

## 2020-06-17 ENCOUNTER — Ambulatory Visit: Payer: Medicare HMO | Admitting: Nurse Practitioner

## 2020-06-21 ENCOUNTER — Other Ambulatory Visit: Payer: Self-pay | Admitting: Family Medicine

## 2020-06-21 DIAGNOSIS — N138 Other obstructive and reflux uropathy: Secondary | ICD-10-CM

## 2020-06-22 ENCOUNTER — Other Ambulatory Visit: Payer: Self-pay

## 2020-06-22 ENCOUNTER — Other Ambulatory Visit: Payer: Medicare HMO

## 2020-06-22 DIAGNOSIS — N401 Enlarged prostate with lower urinary tract symptoms: Secondary | ICD-10-CM | POA: Diagnosis not present

## 2020-06-22 DIAGNOSIS — N138 Other obstructive and reflux uropathy: Secondary | ICD-10-CM | POA: Diagnosis not present

## 2020-06-23 LAB — PSA: Prostate Specific Ag, Serum: 0.9 ng/mL (ref 0.0–4.0)

## 2020-06-30 ENCOUNTER — Ambulatory Visit: Payer: Medicare HMO

## 2020-06-30 ENCOUNTER — Other Ambulatory Visit: Payer: Self-pay

## 2020-06-30 DIAGNOSIS — I6521 Occlusion and stenosis of right carotid artery: Secondary | ICD-10-CM | POA: Diagnosis not present

## 2020-07-02 ENCOUNTER — Other Ambulatory Visit: Payer: Medicare HMO

## 2020-07-06 ENCOUNTER — Ambulatory Visit: Payer: Self-pay | Admitting: Urology

## 2020-07-12 NOTE — Procedures (Signed)
[facility] [address] [place], [place] [address]  DATE OF SERVICE: June 30, 2020  CAROTID DOPPLER INTERPRETATION:  Bilateral Carotid Ultrsasound and Color Doppler Examination was performed. The RIGHT CCA shows moderate plaque in the vessel. The LEFT CCA shows mild plaque in the vessel. There was no significant intimal thickening noted in the RIGHT carotid artery. There was no significant intimal thickening in the LEFT carotid artery.  The RIGHT CCA shows peak systolic velocity of 71 cm per second. The end diastolic velocity is 11 cm per second on the RIGHT side. The RIGHT ICA shows peak systolic velocity of 209 per second. RIGHT sided ICA end diastolic velocity is 38 cm per second. The RIGHT ECA shows a peak systolic velocity of 118 cm per second. The ICA/CCA ratio is calculated to be 2.9. This suggests 50 to 69% stenosis. The Vertebral Artery shows antegrade flow.  The LEFT CCA shows peak systolic velocity of 118 cm per second. The end diastolic velocity is 15 cm per second on the LEFT side. The LEFT ICA shows peak systolic velocity of 127 per second. LEFT sided ICA end diastolic velocity is 20 cm per second. The LEFT ECA shows a peak systolic velocity of 122 cm per second. The ICA/CCA ratio is calculated to be 1.0. This suggests less than 50% stenosis. The Vertebral Artery shows antegrade flow.   Impression:    The RIGHT CAROTID shows 50 to 69% stenosis. The LEFT CAROTID shows less than 50% stenosis.  There is mild plaque formation noted on the LEFT and moderate plaque on the RIGHT  side. Consider a repeat Carotid doppler if clinical situation and symptoms warrant in 6-12 months. Patient should be encouraged to change lifestyles such as smoking cessation, regular exercise and dietary modification. Use of statins in the right clinical setting and ASA is encouraged.  Yevonne Pax, MD [facility] Pulmonary Critical Care Medicine

## 2020-07-15 DIAGNOSIS — S82142A Displaced bicondylar fracture of left tibia, initial encounter for closed fracture: Secondary | ICD-10-CM | POA: Diagnosis not present

## 2020-07-15 DIAGNOSIS — S82892A Other fracture of left lower leg, initial encounter for closed fracture: Secondary | ICD-10-CM | POA: Diagnosis not present

## 2020-07-28 ENCOUNTER — Ambulatory Visit (INDEPENDENT_AMBULATORY_CARE_PROVIDER_SITE_OTHER): Payer: Medicare HMO | Admitting: Hospice and Palliative Medicine

## 2020-07-28 ENCOUNTER — Encounter: Payer: Self-pay | Admitting: Hospice and Palliative Medicine

## 2020-07-28 VITALS — BP 139/70 | HR 65 | Temp 97.5°F | Resp 16 | Ht 73.0 in

## 2020-07-28 DIAGNOSIS — L209 Atopic dermatitis, unspecified: Secondary | ICD-10-CM | POA: Diagnosis not present

## 2020-07-28 DIAGNOSIS — I6521 Occlusion and stenosis of right carotid artery: Secondary | ICD-10-CM | POA: Diagnosis not present

## 2020-07-28 DIAGNOSIS — M25511 Pain in right shoulder: Secondary | ICD-10-CM | POA: Diagnosis not present

## 2020-07-28 DIAGNOSIS — I5020 Unspecified systolic (congestive) heart failure: Secondary | ICD-10-CM | POA: Diagnosis not present

## 2020-07-28 DIAGNOSIS — G8929 Other chronic pain: Secondary | ICD-10-CM | POA: Diagnosis not present

## 2020-07-28 DIAGNOSIS — M25512 Pain in left shoulder: Secondary | ICD-10-CM

## 2020-07-28 MED ORDER — DICLOFENAC SODIUM 1 % EX GEL: 2.0000 g | Freq: Every day | CUTANEOUS | 2 refills | Status: AC

## 2020-07-28 MED ORDER — TRIAMCINOLONE ACETONIDE 0.1 % EX CREA: 1.0000 "application " | TOPICAL_CREAM | Freq: Two times a day (BID) | CUTANEOUS | 1 refills | Status: DC | PRN
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
## 2020-07-31 ENCOUNTER — Encounter: Payer: Self-pay | Admitting: Hospice and Palliative Medicine

## 2020-08-02 ENCOUNTER — Ambulatory Visit: Payer: Medicare HMO | Admitting: Cardiology

## 2020-08-04 ENCOUNTER — Telehealth: Payer: Self-pay

## 2020-08-04 NOTE — Telephone Encounter (Signed)
Pt called about his med diclofenac sodium, he said the pharmace needed something from [place]. I called the pharmacy, med requires a PA, they informed me that this med is now OTC and that may be why insurance wont cover it. Provided info to Desha who is taking care of the PA and she will handle it. Called pt to let him know whats going on and he was going to check out self pay prices while the PA is pending.

## 2020-08-10 ENCOUNTER — Telehealth: Payer: Self-pay

## 2020-08-10 ENCOUNTER — Encounter: Payer: Self-pay | Admitting: Urology

## 2020-08-10 ENCOUNTER — Ambulatory Visit (INDEPENDENT_AMBULATORY_CARE_PROVIDER_SITE_OTHER): Payer: Medicare HMO | Admitting: Urology

## 2020-08-10 ENCOUNTER — Other Ambulatory Visit: Payer: Self-pay

## 2020-08-10 VITALS — BP 136/84 | HR 90 | Ht 73.0 in

## 2020-08-10 DIAGNOSIS — N138 Other obstructive and reflux uropathy: Secondary | ICD-10-CM | POA: Diagnosis not present

## 2020-08-10 DIAGNOSIS — N401 Enlarged prostate with lower urinary tract symptoms: Secondary | ICD-10-CM

## 2020-08-10 DIAGNOSIS — R972 Elevated prostate specific antigen [PSA]: Secondary | ICD-10-CM | POA: Diagnosis not present

## 2020-08-10 LAB — BLADDER SCAN AMB NON-IMAGING: Scan Result: 32
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
## 2020-08-10 NOTE — Telephone Encounter (Signed)
Mailed patient records to [facility] on 08/10/2020 at [address] [place] [address] 

## 2020-08-11 ENCOUNTER — Ambulatory Visit: Payer: Medicare HMO | Admitting: Cardiovascular Disease

## 2020-08-11 DIAGNOSIS — Z96698 Presence of other orthopedic joint implants: Secondary | ICD-10-CM | POA: Diagnosis not present

## 2020-08-11 DIAGNOSIS — S72492A Other fracture of lower end of left femur, initial encounter for closed fracture: Secondary | ICD-10-CM | POA: Diagnosis not present

## 2020-08-11 DIAGNOSIS — S72402A Unspecified fracture of lower end of left femur, initial encounter for closed fracture: Secondary | ICD-10-CM | POA: Diagnosis not present

## 2020-08-11 DIAGNOSIS — S72402D Unspecified fracture of lower end of left femur, subsequent encounter for closed fracture with routine healing: Secondary | ICD-10-CM | POA: Diagnosis not present

## 2020-08-11 DIAGNOSIS — S72492D Other fracture of lower end of left femur, subsequent encounter for closed fracture with routine healing: Secondary | ICD-10-CM | POA: Diagnosis not present

## 2020-08-11 DIAGNOSIS — S82852D Displaced trimalleolar fracture of left lower leg, subsequent encounter for closed fracture with routine healing: Secondary | ICD-10-CM | POA: Diagnosis not present

## 2020-08-11 DIAGNOSIS — S72352A Displaced comminuted fracture of shaft of left femur, initial encounter for closed fracture: Secondary | ICD-10-CM | POA: Diagnosis not present

## 2020-08-11 DIAGNOSIS — S82892D Other fracture of left lower leg, subsequent encounter for closed fracture with routine healing: Secondary | ICD-10-CM | POA: Diagnosis not present

## 2020-08-11 DIAGNOSIS — M1712 Unilateral primary osteoarthritis, left knee: Secondary | ICD-10-CM | POA: Diagnosis not present

## 2020-08-13 ENCOUNTER — Encounter: Payer: Self-pay | Admitting: Cardiovascular Disease

## 2020-08-13 ENCOUNTER — Other Ambulatory Visit: Payer: Self-pay

## 2020-08-13 ENCOUNTER — Ambulatory Visit: Payer: Medicare HMO | Admitting: Cardiovascular Disease

## 2020-08-13 VITALS — BP 138/72 | HR 75 | Ht 73.0 in | Wt 230.0 lb

## 2020-08-13 DIAGNOSIS — I739 Peripheral vascular disease, unspecified: Secondary | ICD-10-CM

## 2020-08-13 DIAGNOSIS — I428 Other cardiomyopathies: Secondary | ICD-10-CM

## 2020-08-13 DIAGNOSIS — I420 Dilated cardiomyopathy: Secondary | ICD-10-CM

## 2020-08-13 DIAGNOSIS — J432 Centrilobular emphysema: Secondary | ICD-10-CM | POA: Diagnosis not present

## 2020-08-13 DIAGNOSIS — I425 Other restrictive cardiomyopathy: Secondary | ICD-10-CM

## 2020-08-13 DIAGNOSIS — I493 Ventricular premature depolarization: Secondary | ICD-10-CM

## 2020-08-13 DIAGNOSIS — Z87891 Personal history of nicotine dependence: Secondary | ICD-10-CM | POA: Diagnosis not present

## 2020-08-13 DIAGNOSIS — E782 Mixed hyperlipidemia: Secondary | ICD-10-CM | POA: Diagnosis not present
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
## 2020-08-13 NOTE — Patient Instructions (Addendum)
Medication Instructions:  No changes  Lab work: No new labs needed  Goal total cholesterol is <150, LDL <70 (preferably <60)  Testing/Procedures: Echo (dilated cardiomyopathy) WE WILL CALL WITH RESULTS   Your physician has requested that you have an echocardiogram. Echocardiography is a painless test that uses sound waves to create images of your heart. It provides your doctor with information about the size and shape of your heart and how well your heart's chambers and valves are working. This procedure takes approximately one hour. There are no restrictions for this procedure.  There is a possibility that an IV may need to be started during your test to inject an image enhancing agent. This is done to obtain more optimal pictures of your heart. Therefore we ask that you do at least drink some water prior to coming in to hydrate your veins.     Follow-Up: At [facility], you and your health needs are our priority.  As part of our continuing mission to provide you with exceptional heart care, we have created designated Provider Care Teams.  These Care Teams include your primary Cardiologist (physician) and Advanced Practice Providers (APPs -  Physician Assistants and Nurse Practitioners) who all work together to provide you with the care you need, when you need it.  . You will need a follow up appointment in 6 months  . Providers on your designated Care Team:   . Nicolasa Ducking, NP . Eula Listen, PA-C . Marisue Ivan, PA-C  Any Other Special Instructions Will Be Listed Below (If Applicable).  COVID-19 Vaccine Information can be found at: [email or web address] For questions related to vaccine distribution or appointments, please email vaccine@[email or web address] .com or call 6103714279.

## 2020-08-15 DIAGNOSIS — S82892A Other fracture of left lower leg, initial encounter for closed fracture: Secondary | ICD-10-CM | POA: Diagnosis not present

## 2020-08-15 DIAGNOSIS — S82142A Displaced bicondylar fracture of left tibia, initial encounter for closed fracture: Secondary | ICD-10-CM | POA: Diagnosis not present

## 2020-09-02 ENCOUNTER — Other Ambulatory Visit: Payer: Self-pay

## 2020-09-02 ENCOUNTER — Ambulatory Visit (INDEPENDENT_AMBULATORY_CARE_PROVIDER_SITE_OTHER): Payer: Medicare HMO

## 2020-09-02 DIAGNOSIS — I425 Other restrictive cardiomyopathy: Secondary | ICD-10-CM | POA: Diagnosis not present

## 2020-09-02 DIAGNOSIS — I428 Other cardiomyopathies: Secondary | ICD-10-CM | POA: Diagnosis not present

## 2020-09-02 LAB — ECHOCARDIOGRAM COMPLETE
AR max vel: 0.83 cm2
AV Area VTI: 0.87 cm2
AV Area mean vel: 0.9 cm2
AV Mean grad: 13 mmHg
AV Peak grad: 22.7 mmHg
Ao pk vel: 2.38 m/s
Area-P 1/2: 2.79 cm2
S' Lateral: 4.8 cm

## 2020-09-09 ENCOUNTER — Telehealth: Payer: Self-pay | Admitting: *Deleted

## 2020-09-09 NOTE — Telephone Encounter (Signed)
-----   Message from Casey Iba, MD sent at 09/08/2020  2:15 PM EDT ----- Echocardiogram ejection fraction has increased now 40 to 45% In 2021 was 35 to 40% Normal in the 50s Work-up could include evaluation of his frequent PVCs which can cause decreased ejection fraction, for this we would order a 2-day Holter monitor to look at PVC burden Does not appear he has had ischemic work-up, this would include pharmacologic Myoview to rule out ischemia as a cause of mildly decreased function If he can let us know he would like [place] to do any additional work-up as detailed above

## 2020-09-09 NOTE — Telephone Encounter (Signed)
Spoke with patient and reviewed his results and recommendations. Discussed additional testing and he reports having previous stress tests (with Dr. Maryruth Bun) done. He also wore heart monitor back on 04/27/20 which is in care everywhere. Offered upcoming appointment but he declined due to transportation. He has scheduled appointment in August and will wait until then. Reviewed that we could order testing and to call if he should change his mind. He verbalized understanding of our conversation, agreement with plan, and had no further questions at this time. Advised I would update provider on monitor he wore and reports of previous stress tests.

## 2020-09-12 DIAGNOSIS — S82142A Displaced bicondylar fracture of left tibia, initial encounter for closed fracture: Secondary | ICD-10-CM | POA: Diagnosis not present

## 2020-09-12 DIAGNOSIS — S82892A Other fracture of left lower leg, initial encounter for closed fracture: Secondary | ICD-10-CM | POA: Diagnosis not present

## 2020-10-13 DIAGNOSIS — S82142A Displaced bicondylar fracture of left tibia, initial encounter for closed fracture: Secondary | ICD-10-CM | POA: Diagnosis not present

## 2020-10-13 DIAGNOSIS — S82892A Other fracture of left lower leg, initial encounter for closed fracture: Secondary | ICD-10-CM | POA: Diagnosis not present

## 2020-10-26 ENCOUNTER — Other Ambulatory Visit: Payer: Self-pay

## 2020-10-26 ENCOUNTER — Ambulatory Visit (INDEPENDENT_AMBULATORY_CARE_PROVIDER_SITE_OTHER): Payer: Medicare HMO | Admitting: Hospice and Palliative Medicine

## 2020-10-26 ENCOUNTER — Encounter: Payer: Self-pay | Admitting: Hospice and Palliative Medicine

## 2020-10-26 VITALS — BP 132/78 | HR 80 | Temp 97.2°F | Resp 16 | Ht 73.0 in | Wt 241.0 lb

## 2020-10-26 DIAGNOSIS — L209 Atopic dermatitis, unspecified: Secondary | ICD-10-CM | POA: Diagnosis not present

## 2020-10-26 DIAGNOSIS — I10 Essential (primary) hypertension: Secondary | ICD-10-CM | POA: Diagnosis not present

## 2020-10-26 DIAGNOSIS — I5020 Unspecified systolic (congestive) heart failure: Secondary | ICD-10-CM

## 2020-10-26 DIAGNOSIS — J0140 Acute pansinusitis, unspecified: Secondary | ICD-10-CM | POA: Diagnosis not present

## 2020-10-26 MED ORDER — TRIAMCINOLONE ACETONIDE 0.1 % EX CREA: 1.0000 "application " | TOPICAL_CREAM | Freq: Two times a day (BID) | CUTANEOUS | 1 refills | Status: DC | PRN

## 2020-10-26 MED ORDER — AZITHROMYCIN 250 MG PO TABS: ORAL_TABLET | ORAL | 0 refills | Status: AC

## 2020-10-26 NOTE — Progress Notes (Signed)
[facility] [address] [place], [place] [address]  Internal MEDICINE  Office Visit Note  Patient Name: Casey PEREZPEREZ  [id number]  [id number]  Date of Service: 10/28/2020  Chief Complaint  Patient presents with  . Follow-up  . Hyperlipidemia  . COPD    HPI Patient is here for routine follow-up Has been doing very well Has follow up with ortho surgeon later this month Has been working independently on building up his strenght, no longer requiring wheelchair now using walker to help with mobility Due to increased cases of COVID he was uncomfortable with going through PT Has been released from urology--PSA well controlled  Complaining of nasal congestion and sinus pain and pressure and a dry cough that has been ongoing for 3-4 days   Current Medication: Outpatient Encounter Medications as of 10/26/2020  Medication Sig  . albuterol (VENTOLIN HFA) 108 (90 Base) MCG/ACT inhaler Inhale 2 puffs every 4 to 6 hrs as needed for sob  . atorvastatin (LIPITOR) 20 MG tablet Take 1 tablet (20 mg total) by mouth daily.  Marland Kitchen azithromycin (ZITHROMAX) 250 MG tablet Take 2 tablets on day 1, then 1 tablet daily on days 2 through 5  . chlorhexidine (PERIDEX) 0.12 % solution Use as directed 15 mLs in the mouth or throat 2 (two) times daily.  . diclofenac Sodium (VOLTAREN) 1 % GEL Apply 2 g topically daily.  Marland Kitchen EPINEPHrine (EPIPEN 2-PAK) 0.3 mg/0.3 mL IJ SOAJ injection Use as directed  as needed for anaphylaxis  . losartan (COZAAR) 25 MG tablet Take 1 tablet (25 mg total) by mouth daily.  . metoprolol succinate (TOPROL-XL) 25 MG 24 hr tablet Take 1 tablet (25 mg total) by mouth daily.  . Multiple Vitamins-Minerals (MULTIVITAMIN WITH MINERALS) tablet Take 1 tablet by mouth daily.  . [DISCONTINUED] triamcinolone (KENALOG) 0.1 % Apply 1 application topically 2 (two) times daily as needed (irritation). Apply to affected area twice a day as needed  . triamcinolone cream (KENALOG) 0.1 % Apply 1  application topically 2 (two) times daily as needed (irritation). Apply to affected area twice a day as needed   No facility-administered encounter medications on file as of 10/26/2020.    Surgical History: Past Surgical History:  Procedure Laterality Date  . HERNIA REPAIR    . HOLEP-LASER ENUCLEATION OF THE PROSTATE WITH MORCELLATION N/A 01/05/2020   Procedure: HOLEP-LASER ENUCLEATION OF THE PROSTATE WITH MORCELLATION;  Surgeon: Vanna Scotland, MD;  Location: [facility] ORS;  Service: Urology;  Laterality: N/A;  . KNEE ARTHROSCOPY Left   . LEG SURGERY Left   . MANDIBLE SURGERY    . TONSILLECTOMY      Medical History: Past Medical History:  Diagnosis Date  . COPD (chronic obstructive pulmonary disease) (HCC)   . Heart murmur   . Hyperlipidemia     Family History: Family History  Problem Relation Age of Onset  . Lymphoma Father   . Bladder Cancer Mother   . Prostate cancer Paternal Grandfather   . Kidney cancer Neg Hx     Social History   Socioeconomic History  . Marital status: Married    Spouse name: Not on file  . Number of children: Not on file  . Years of education: Not on file  . Highest education level: Not on file  Occupational History  . Not on file  Tobacco Use  . Smoking status: Former Smoker    Types: Cigarettes  . Smokeless tobacco: Never Used  . Tobacco comment: quit 11yrs ago  Vaping Use  . Vaping Use: Never used  Substance and Sexual Activity  . Alcohol use: Never  . Drug use: Never  . Sexual activity: Not on file  Other Topics Concern  . Not on file  Social History Narrative  . Not on file   Social Determinants of Health   Financial Resource Strain: Not on file  Food Insecurity: Not on file  Transportation Needs: Not on file  Physical Activity: Not on file  Stress: Not on file  Social Connections: Not on file  Intimate Partner Violence: Not on file      Review of Systems  Constitutional: Negative for chills, fatigue and unexpected  weight change.  HENT: Negative for congestion, postnasal drip, rhinorrhea, sneezing and sore throat.   Eyes: Negative for redness.  Respiratory: Negative for cough, chest tightness and shortness of breath.   Cardiovascular: Negative for chest pain and palpitations.  Gastrointestinal: Negative for abdominal pain, constipation, diarrhea, nausea and vomiting.  Genitourinary: Negative for dysuria and frequency.  Musculoskeletal: Negative for arthralgias, back pain, joint swelling and neck pain.  Skin: Negative for rash.  Neurological: Negative for tremors and numbness.  Hematological: Negative for adenopathy. Does not bruise/bleed easily.  Psychiatric/Behavioral: Negative for behavioral problems (Depression), sleep disturbance and suicidal ideas. The patient is not nervous/anxious.     Vital Signs: BP 132/78   Pulse 80   Temp (!) 97.2 F (36.2 C)   Resp 16   Ht 6\' 1"  (1.854 m)   Wt 241 lb (109.3 kg)   SpO2 97%   BMI 31.80 kg/m    Physical Exam Vitals reviewed.  Constitutional:      Appearance: Normal appearance. He is normal weight.  Cardiovascular:     Rate and Rhythm: Normal rate and regular rhythm.     Pulses: Normal pulses.     Heart sounds: Normal heart sounds.  Pulmonary:     Effort: Pulmonary effort is normal.     Breath sounds: Normal breath sounds.  Abdominal:     General: Abdomen is flat.     Palpations: Abdomen is soft.  Musculoskeletal:        General: Normal range of motion.     Cervical back: Normal range of motion.  Skin:    General: Skin is warm.  Neurological:     General: No focal deficit present.     Mental Status: He is alert and oriented to person, place, and time. Mental status is at baseline.  Psychiatric:        Mood and Affect: Mood normal.        Behavior: Behavior normal.        Thought Content: Thought content normal.        Judgment: Judgment normal.    Assessment/Plan: 1. Acute non-recurrent pansinusitis Treat with Zpak and may take  OTC Mucinex, advised to contact office if symptoms worsen or have not improved within 10 days - azithromycin (ZITHROMAX) 250 MG tablet; Take 2 tablets on day 1, then 1 tablet daily on days 2 through 5  Dispense: 6 tablet; Refill: 0  2. Essential hypertension BP and HR well controlled, continue to monitor  3. Atopic dermatitis, unspecified type Stable, requesting refills - triamcinolone cream (KENALOG) 0.1 %; Apply 1 application topically 2 (two) times daily as needed (irritation). Apply to affected area twice a day as needed  Dispense: 80 g; Refill: 1  4. HFrEF (heart failure with reduced ejection fraction) (HCC) Has established with local cardiology, remains stable, recommended stress  testing that he deferred at this time  General Counseling: loic hobin understanding of the findings of todays visit and agrees with plan of treatment. I have discussed any further diagnostic evaluation that may be needed or ordered today. We also reviewed his medications today. he has been encouraged to call the office with any questions or concerns that should arise related to todays visit.   Meds ordered this encounter  Medications  . triamcinolone cream (KENALOG) 0.1 %    Sig: Apply 1 application topically 2 (two) times daily as needed (irritation). Apply to affected area twice a day as needed    Dispense:  80 g    Refill:  1  . azithromycin (ZITHROMAX) 250 MG tablet    Sig: Take 2 tablets on day 1, then 1 tablet daily on days 2 through 5    Dispense:  6 tablet    Refill:  0    Time spent:30 Minutes Time spent includes review of chart, medications, test results and follow-up plan with the patient.  This patient was seen by Leeanne Deed AGNP-C in Collaboration with Dr Lyndon Code as a part of collaborative care agreement     Lubertha Basque. Idara Woodside AGNP-C Internal medicine

## 2020-10-28 ENCOUNTER — Encounter: Payer: Self-pay | Admitting: Hospice and Palliative Medicine

## 2020-11-10 DIAGNOSIS — S72402D Unspecified fracture of lower end of left femur, subsequent encounter for closed fracture with routine healing: Secondary | ICD-10-CM | POA: Diagnosis not present

## 2020-11-10 DIAGNOSIS — M1712 Unilateral primary osteoarthritis, left knee: Secondary | ICD-10-CM | POA: Diagnosis not present

## 2020-11-10 DIAGNOSIS — S82892D Other fracture of left lower leg, subsequent encounter for closed fracture with routine healing: Secondary | ICD-10-CM | POA: Diagnosis not present

## 2020-11-10 DIAGNOSIS — S81002D Unspecified open wound, left knee, subsequent encounter: Secondary | ICD-10-CM | POA: Diagnosis not present

## 2020-11-10 DIAGNOSIS — X58XXXD Exposure to other specified factors, subsequent encounter: Secondary | ICD-10-CM | POA: Diagnosis not present

## 2020-11-10 DIAGNOSIS — S72352D Displaced comminuted fracture of shaft of left femur, subsequent encounter for closed fracture with routine healing: Secondary | ICD-10-CM | POA: Diagnosis not present

## 2020-11-10 DIAGNOSIS — S72492D Other fracture of lower end of left femur, subsequent encounter for closed fracture with routine healing: Secondary | ICD-10-CM | POA: Diagnosis not present

## 2020-11-10 DIAGNOSIS — S82852D Displaced trimalleolar fracture of left lower leg, subsequent encounter for closed fracture with routine healing: Secondary | ICD-10-CM | POA: Diagnosis not present

## 2020-11-10 DIAGNOSIS — T148XXA Other injury of unspecified body region, initial encounter: Secondary | ICD-10-CM | POA: Diagnosis not present

## 2020-11-10 DIAGNOSIS — S72492A Other fracture of lower end of left femur, initial encounter for closed fracture: Secondary | ICD-10-CM | POA: Diagnosis not present

## 2020-11-12 DIAGNOSIS — S82892A Other fracture of left lower leg, initial encounter for closed fracture: Secondary | ICD-10-CM | POA: Diagnosis not present

## 2020-11-12 DIAGNOSIS — S82142A Displaced bicondylar fracture of left tibia, initial encounter for closed fracture: Secondary | ICD-10-CM | POA: Diagnosis not present

## 2020-11-20 ENCOUNTER — Other Ambulatory Visit: Payer: Self-pay

## 2020-11-20 DIAGNOSIS — I5020 Unspecified systolic (congestive) heart failure: Secondary | ICD-10-CM

## 2020-11-20 DIAGNOSIS — I10 Essential (primary) hypertension: Secondary | ICD-10-CM

## 2020-11-20 DIAGNOSIS — I6521 Occlusion and stenosis of right carotid artery: Secondary | ICD-10-CM

## 2020-11-20 MED ORDER — METOPROLOL SUCCINATE ER 25 MG PO TB24: 25.0000 mg | ORAL_TABLET | Freq: Every day | ORAL | 1 refills | Status: DC

## 2020-11-20 MED ORDER — LOSARTAN POTASSIUM 25 MG PO TABS: 25.0000 mg | ORAL_TABLET | Freq: Every day | ORAL | 1 refills | Status: DC

## 2020-11-20 MED ORDER — ATORVASTATIN CALCIUM 20 MG PO TABS: 20.0000 mg | ORAL_TABLET | Freq: Every day | ORAL | 1 refills | Status: DC

## 2020-12-06 ENCOUNTER — Telehealth: Payer: Self-pay | Admitting: Internal Medicine

## 2020-12-06 NOTE — Chronic Care Management (AMB) (Signed)
  Chronic Care Management   Outreach Note  12/06/2020 Name: Casey Arias MRN: [id number] DOB: April 23, 1946  Referred by: Lyndon Code, MD Reason for referral : No chief complaint on file.   An unsuccessful telephone outreach was attempted today. The patient was referred to the pharmacist for assistance with care management and care coordination.   Follow Up Plan:   Tatjana Dellinger Upstream Scheduler

## 2020-12-13 DIAGNOSIS — S82892A Other fracture of left lower leg, initial encounter for closed fracture: Secondary | ICD-10-CM | POA: Diagnosis not present

## 2020-12-13 DIAGNOSIS — S82142A Displaced bicondylar fracture of left tibia, initial encounter for closed fracture: Secondary | ICD-10-CM | POA: Diagnosis not present

## 2020-12-23 ENCOUNTER — Telehealth: Payer: Self-pay | Admitting: Internal Medicine

## 2020-12-23 NOTE — Chronic Care Management (AMB) (Signed)
  Chronic Care Management   Note  12/23/2020 Name: Casey Arias MRN: [id number] DOB: October 02, 1945  Casey Arias is a 75 y.o. year old male who is a primary care patient of Lyndon Code, MD. I reached out to Casey Arias by phone today in response to a referral sent by Casey Arias, Lyndon Code, MD.   Casey Arias was given information about Chronic Care Management services today including:  CCM service includes personalized support from designated clinical staff supervised by his physician, including individualized plan of care and coordination with other care providers 24/7 contact phone numbers for assistance for urgent and routine care needs. Service will only be billed when office clinical staff spend 20 minutes or more in a month to coordinate care. Only one practitioner may furnish and bill the service in a calendar month. The patient may stop CCM services at any time (effective at the end of the month) by phone call to the office staff.   Patient agreed to services and verbal consent obtained.   Follow up plan:   Tatjana Restaurant manager, fast food

## 2021-01-05 ENCOUNTER — Telehealth: Payer: Self-pay | Admitting: Pharmacist

## 2021-01-05 NOTE — Progress Notes (Addendum)
    Chronic Care Management Pharmacy Assistant   Name: VONN SLIGER  MRN: [id number] DOB: Jan 26, 1946  Casey Arias is an 75 y.o. year old male who presents for his initial CCM visit with the clinical pharmacist.  Reason for Encounter: Chart Prep    Conditions to be addressed/monitored: Asthma, HLD, HTN.  Primary concerns for visit include: Asthma, HLD, HTN.  Recent office visits: 10/26/20 Theotis Burrow, NP. For follow-up/non-recurrent pansinusitis. STARTED Azithromycin 250 mg Take 2 tablets on day 1, then 1 tablet daily on days 2 through 5. 07/28/20 Theotis Burrow, NP. For follow-up. No medication changes.   Recent consult visits:  11/10/20 Orthopedic Surgery For closed fracture of femur. No information given.  08/13/20 Cardiology Gollan, Tollie Pizza, MD. 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 No medication changes. 08/11/20 Orthopedic Surgery For closed fracture of femur. No information given.  08/10/20 Urology Joycelyn Man, MD. For benign prostatic hyperplasia. No medication changes.   Hospital visits:  None in previous 6 months  Medication History: Atorvastatin 20 mg 90 DS 5/28/222 Losartan 25 mg 90 DS 11/20/20  Medications: Outpatient Encounter Medications as of 01/05/2021  Medication Sig   albuterol (VENTOLIN HFA) 108 (90 Base) MCG/ACT inhaler Inhale 2 puffs every 4 to 6 hrs as needed for sob   atorvastatin (LIPITOR) 20 MG tablet Take 1 tablet (20 mg total) by mouth daily.   chlorhexidine (PERIDEX) 0.12 % solution Use as directed 15 mLs in the mouth or throat 2 (two) times daily.   diclofenac Sodium (VOLTAREN) 1 % GEL Apply 2 g topically daily.   EPINEPHrine (EPIPEN 2-PAK) 0.3 mg/0.3 mL IJ SOAJ injection Use as directed  as needed for anaphylaxis   losartan (COZAAR) 25 MG tablet Take 1 tablet (25 mg total) by mouth daily.   metoprolol succinate (TOPROL-XL) 25 MG 24 hr tablet Take 1 tablet (25 mg total) by mouth daily.   Multiple  Vitamins-Minerals (MULTIVITAMIN WITH MINERALS) tablet Take 1 tablet by mouth daily.   triamcinolone cream (KENALOG) 0.1 % Apply 1 application topically 2 (two) times daily as needed (irritation). Apply to affected area twice a day as needed   No facility-administered encounter medications on file as of 01/05/2021.    Have you seen any other providers since your last visit? Patient stated no.  Any changes in your medications or health? Patient stated no.  Any side effects from any medications? Patient stated no.  Do you have an symptoms or problems not managed by your medications? Patient stated no.  Any concerns about your health right now? Patient stated no.  Has your provider asked that you check blood pressure, blood sugar, or follow special diet at home? Patient stated no.  Do you get any type of exercise on a regular basis? Patient stated he exercises 3 days a week.  Can you think of a goal you would like to reach for your health? Patient stated to loose weight.   Do you have any problems getting your medications? Patient stated no.  Is there anything that you would like to discuss during the appointment? Patient stated no.  Please bring medications and supplements to appointment, patient reminded of his face to face appointment on 01/14/21 at 9 am.  Follow-Up:Pharmacist Review  Hulen Luster, RMA Clinical Pharmacist Assistant [phone]
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
## 2021-01-12 DIAGNOSIS — S82142A Displaced bicondylar fracture of left tibia, initial encounter for closed fracture: Secondary | ICD-10-CM | POA: Diagnosis not present

## 2021-01-12 DIAGNOSIS — S82892A Other fracture of left lower leg, initial encounter for closed fracture: Secondary | ICD-10-CM | POA: Diagnosis not present

## 2021-01-14 ENCOUNTER — Telehealth: Payer: Self-pay | Admitting: Pharmacist

## 2021-01-14 ENCOUNTER — Ambulatory Visit: Payer: Medicare HMO | Admitting: Pharmacist

## 2021-01-14 ENCOUNTER — Other Ambulatory Visit: Payer: Self-pay

## 2021-01-14 DIAGNOSIS — J4520 Mild intermittent asthma, uncomplicated: Secondary | ICD-10-CM

## 2021-01-14 DIAGNOSIS — I493 Ventricular premature depolarization: Secondary | ICD-10-CM

## 2021-01-14 NOTE — Progress Notes (Addendum)
    Chronic Care Management Pharmacy Assistant   Name: Casey Arias  MRN: [id number] DOB: 07-31-1945  Reason for Encounter: CCM Care Plan   Conditions to be addressed/monitored: PVCs/Palpitations, Asthma, Shortness of Breath, HLD  Medications: Outpatient Encounter Medications as of 01/14/2021  Medication Sig Note   albuterol (VENTOLIN HFA) 108 (90 Base) MCG/ACT inhaler Inhale 2 puffs every 4 to 6 hrs as needed for sob    atorvastatin (LIPITOR) 20 MG tablet Take 1 tablet (20 mg total) by mouth daily. 01/14/2021: Twice weekly   chlorhexidine (PERIDEX) 0.12 % solution Use as directed 15 mLs in the mouth or throat 2 (two) times daily.    diclofenac Sodium (VOLTAREN) 1 % GEL Apply 2 g topically daily.    EPINEPHrine (EPIPEN 2-PAK) 0.3 mg/0.3 mL IJ SOAJ injection Use as directed  as needed for anaphylaxis    losartan (COZAAR) 25 MG tablet Take 1 tablet (25 mg total) by mouth daily. 01/14/2021: Only taking twice weekly?   metoprolol succinate (TOPROL-XL) 25 MG 24 hr tablet Take 1 tablet (25 mg total) by mouth daily. 01/14/2021: Taking twice weekly?   Multiple Vitamins-Minerals (MULTIVITAMIN WITH MINERALS) tablet Take 1 tablet by mouth daily.    triamcinolone cream (KENALOG) 0.1 % Apply 1 application topically 2 (two) times daily as needed (irritation). Apply to affected area twice a day as needed    No facility-administered encounter medications on file as of 01/14/2021.   Reviewed the patients initial visit reinsured it was completed per the pharmacist Erskine Emery request. Printed the CCM care plan. Mailed the patient CCM care plan to their most recent address on file.  Follow-Up:Pharmacist Review  Hulen Luster, RMA Clinical Pharmacist Assistant [phone]
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
## 2021-01-23 DIAGNOSIS — J449 Chronic obstructive pulmonary disease, unspecified: Secondary | ICD-10-CM | POA: Diagnosis not present

## 2021-01-23 DIAGNOSIS — I10 Essential (primary) hypertension: Secondary | ICD-10-CM | POA: Diagnosis not present

## 2021-01-23 DIAGNOSIS — E785 Hyperlipidemia, unspecified: Secondary | ICD-10-CM | POA: Diagnosis not present

## 2021-01-25 ENCOUNTER — Other Ambulatory Visit: Payer: Self-pay | Admitting: *Deleted

## 2021-01-25 NOTE — Patient Outreach (Signed)
[facility]) Care Management  01/25/2021  Casey Arias 1946/02/06 [id number]   [facility] Unsuccessful outreach   Referred to [facility] for [employer] medicare screening  Outreach attempt to the home number  No answer. [facility] RN CM left HIPAA [facility] Portability and Accountability Act) compliant voicemail message along with CM's contact info.    Patient Active Problem List   Diagnosis Date Noted   Urinary tract infection with hematuria 02/13/2020   BPH with obstruction/lower urinary tract symptoms 01/05/2020   Dental abscess 05/01/2019   Mild intermittent asthma without complication 02/12/2019   Atopic dermatitis 05/08/2018   Benign prostatic hyperplasia without lower urinary tract symptoms 05/08/2018   Encounter for general adult medical examination with abnormal findings 10/26/2017   Allergy to bee sting 10/26/2017   Need for vaccination against Streptococcus pneumoniae using pneumococcal conjugate vaccine 13 10/26/2017   Dysuria 10/26/2017   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 90 tablet 1   chlorhexidine (PERIDEX) 0.12 % solution Use as directed 15 mLs in the mouth or throat 2 (two) times daily. 1893 mL 1   diclofenac Sodium (VOLTAREN) 1 % GEL Apply 2 g topically daily. 100 g 2   EPINEPHrine (EPIPEN 2-PAK) 0.3 mg/0.3 mL IJ SOAJ injection Use as directed  as needed for anaphylaxis 1 Device 1   losartan (COZAAR) 25 MG tablet Take 1 tablet (25 mg total) by mouth daily. 90 tablet 1   metoprolol succinate (TOPROL-XL) 25 MG 24 hr tablet Take 1 tablet (25 mg total) by mouth daily. 90 tablet 1   Multiple Vitamins-Minerals (MULTIVITAMIN WITH  MINERALS) tablet Take 1 tablet by mouth daily.     triamcinolone cream (KENALOG) 0.1 % Apply 1 application topically 2 (two) times daily as needed (irritation). Apply to affected area twice a day as needed 80 g 1   No current facility-administered medications on file prior to visit.    Plan: [facility] RN CM scheduled this patient for another call attempt within 4-7 business days Unsuccessful outreach letter sent on 01/25/21 Unsuccessful outreach on 01/25/21   [name], RN, BSN, CCM [facility] Telephonic Care Management Care Coordinator Office number [phone] Mobile number [phone]  Main [facility] number [phone] Fax number [phone]

## 2021-02-01 ENCOUNTER — Other Ambulatory Visit: Payer: Self-pay | Admitting: *Deleted

## 2021-02-01 NOTE — Patient Outreach (Signed)
[facility]) Care Management  02/01/2021  Casey Arias Feb 12, 1946 [id number]   [facility] second Unsuccessful outreach    Referred to [facility] for Aetna medicare screening   Outreach attempt to the home number No answer. [facility] RN CM left HIPAA [facility] Portability and Accountability Act) compliant voicemail message along with CM's contact info.          Patient Active Problem List    Diagnosis Date Noted   Urinary tract infection with hematuria 02/13/2020   BPH with obstruction/lower urinary tract symptoms 01/05/2020   Dental abscess 05/01/2019   Mild intermittent asthma without complication 02/12/2019   Atopic dermatitis 05/08/2018   Benign prostatic hyperplasia without lower urinary tract symptoms 05/08/2018   Encounter for general adult medical examination with abnormal findings 10/26/2017   Allergy to bee sting 10/26/2017   Need for vaccination against Streptococcus pneumoniae using pneumococcal conjugate vaccine 13 10/26/2017   Dysuria 10/26/2017   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   Palpitations 11/12/2015   Shortness of breath 11/12/2015          Current Outpatient Medications on File Prior to Visit  Medication Sig Dispense Refill   albuterol (VENTOLIN HFA) 108 (90 Base) MCG/ACT inhaler Inhale 2 puffs every 4 to 6 hrs as needed for sob 18 g 5   atorvastatin (LIPITOR) 20 MG tablet Take 1 tablet (20 mg total) by mouth daily. 90 tablet 1   chlorhexidine (PERIDEX) 0.12 % solution Use as directed 15 mLs in the mouth or throat 2 (two) times daily. 1893 mL 1   diclofenac Sodium (VOLTAREN) 1 % GEL Apply 2 g topically daily. 100 g 2   EPINEPHrine (EPIPEN 2-PAK) 0.3 mg/0.3 mL IJ SOAJ injection Use as directed  as needed for anaphylaxis 1 Device 1   losartan (COZAAR) 25 MG tablet Take 1 tablet (25 mg total) by mouth daily. 90 tablet 1   metoprolol succinate (TOPROL-XL) 25 MG 24 hr tablet Take 1 tablet (25 mg total) by mouth daily. 90 tablet 1   Multiple Vitamins-Minerals  (MULTIVITAMIN WITH MINERALS) tablet Take 1 tablet by mouth daily.       triamcinolone cream (KENALOG) 0.1 % Apply 1 application topically 2 (two) times daily as needed (irritation). Apply to affected area twice a day as needed 80 g 1    No current facility-administered medications on file prior to visit.      Plan: [facility] RN CM scheduled this patient for a third call attempt within 4-7 business days Unsuccessful outreach letter sent on 01/25/21 Unsuccessful outreach on 01/25/21, 02/01/21     [name], RN, BSN, CCM [facility] Telephonic Care Management Care Coordinator Office number [phone] Mobile number [phone] Main [facility] number [phone] Fax number [phone]

## 2021-02-08 ENCOUNTER — Other Ambulatory Visit: Payer: Self-pay | Admitting: *Deleted

## 2021-02-08 NOTE — Patient Outreach (Signed)
[facility]) Care Management  02/08/2021  Casey Arias Feb 27, 1946 [id number]   [facility] third Unsuccessful outreach    Referred to [facility] for Aetna medicare screening   Outreach attempt to the home number [phone] No answer. [facility] RN CM left HIPAA [facility] Portability and Accountability Act) compliant voicemail message along with CM's contact info.     Plan: [facility] RN CM scheduled this patient for a third call attempt within 4-7 business days Unsuccessful outreach letter sent on 01/25/21 Unsuccessful outreach on 01/25/21, 02/01/21, 02/08/21     [name] L. Noelle Penner, RN, BSN, CCM [facility] Telephonic Care Management Care Coordinator Office number [phone] Mobile number [phone] Main [facility] number [phone] Fax number [phone]

## 2021-02-12 DIAGNOSIS — S82142A Displaced bicondylar fracture of left tibia, initial encounter for closed fracture: Secondary | ICD-10-CM | POA: Diagnosis not present

## 2021-02-12 DIAGNOSIS — S82892A Other fracture of left lower leg, initial encounter for closed fracture: Secondary | ICD-10-CM | POA: Diagnosis not present
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
## 2021-02-14 ENCOUNTER — Encounter: Payer: Self-pay | Admitting: Cardiovascular Disease

## 2021-02-14 ENCOUNTER — Ambulatory Visit: Payer: Medicare HMO | Admitting: Cardiovascular Disease

## 2021-02-14 ENCOUNTER — Other Ambulatory Visit: Payer: Self-pay

## 2021-02-14 VITALS — BP 140/92 | HR 149 | Ht 73.5 in | Wt 258.1 lb

## 2021-02-14 DIAGNOSIS — J432 Centrilobular emphysema: Secondary | ICD-10-CM | POA: Diagnosis not present

## 2021-02-14 DIAGNOSIS — E782 Mixed hyperlipidemia: Secondary | ICD-10-CM | POA: Diagnosis not present

## 2021-02-14 DIAGNOSIS — I493 Ventricular premature depolarization: Secondary | ICD-10-CM | POA: Diagnosis not present

## 2021-02-14 DIAGNOSIS — I4819 Other persistent atrial fibrillation: Secondary | ICD-10-CM | POA: Diagnosis not present

## 2021-02-14 DIAGNOSIS — I5020 Unspecified systolic (congestive) heart failure: Secondary | ICD-10-CM | POA: Diagnosis not present

## 2021-02-14 DIAGNOSIS — I739 Peripheral vascular disease, unspecified: Secondary | ICD-10-CM

## 2021-02-14 DIAGNOSIS — I420 Dilated cardiomyopathy: Secondary | ICD-10-CM | POA: Diagnosis not present

## 2021-02-14 DIAGNOSIS — Z87891 Personal history of nicotine dependence: Secondary | ICD-10-CM

## 2021-02-14 MED ORDER — POTASSIUM CHLORIDE CRYS ER 20 MEQ PO TBCR: 20.0000 meq | EXTENDED_RELEASE_TABLET | Freq: Every day | ORAL | 3 refills | Status: DC

## 2021-02-14 MED ORDER — DILTIAZEM HCL 30 MG PO TABS
60.0000 mg | ORAL_TABLET | Freq: Once | ORAL | Status: AC
  Administered 2021-02-14: 60 mg via ORAL

## 2021-02-14 MED ORDER — RIVAROXABAN 20 MG PO TABS: 20.0000 mg | ORAL_TABLET | Freq: Every day | ORAL | 6 refills | Status: DC

## 2021-02-14 MED ORDER — METOPROLOL SUCCINATE ER 25 MG PO TB24
ORAL_TABLET | ORAL | 1 refills | Status: DC
Start: 2021-02-14 — End: 2021-02-21

## 2021-02-14 MED ORDER — FUROSEMIDE 40 MG PO TABS: 40.0000 mg | ORAL_TABLET | Freq: Every day | ORAL | 3 refills | Status: DC

## 2021-02-14 MED ORDER — METOPROLOL TARTRATE 12.5 MG HALF TABLET
50.0000 mg | ORAL_TABLET | Freq: Once | ORAL | Status: AC
  Administered 2021-02-14: 50 mg via ORAL

## 2021-02-14 NOTE — Patient Instructions (Addendum)
Please buy a blood pressure cuff (Omron is a good brand) - get an arm cuff & not a wrist cuff  Medication Instructions:  1) Stop the losartan  2) Please start xarelto 20 mg once daily with supper  3) Please start metoprolol succinate 100 mg daily with dinner for heart rate >110 beats per minute (Take 50 mg for rate <110)  4) Please start lasix 40 mg daily in Am  5) Please start potassium 20 mg daily   Samples Given:  Xarelto 20 mg Lot: [id number] Exp: 01/2022   Today in clinic you were given: Metoprolol tartrate 50 mg x 1 dose Diltiazem 60 mg x 1 dose   If you need a refill on your cardiac medications before your next appointment, please call your pharmacy.    Lab work: No new labs needed   If you have labs (blood work) drawn today and your tests are completely normal, you will receive your results only by: MyChart Message (if you have MyChart) OR A paper copy in the mail If you have any lab test that is abnormal or we need to change your treatment, we will call you to review the results.   Testing/Procedures: No new testing needed   Follow-Up: At [facility], you and your health needs are our priority.  As part of our continuing mission to provide you with exceptional heart care, we have created designated Provider Care Teams.  These Care Teams include your primary Cardiologist (physician) and Advanced Practice Providers (APPs -  Physician Assistants and Nurse Practitioners) who all work together to provide you with the care you need, when you need it.  You will need a follow up appointment in 1 week  Providers on your designated Care Team:   Nicolasa Ducking, NP Eula Listen, PA-C Marisue Ivan, PA-C Cadence Fransico [place], [place]  Any Other Special Instructions Will Be Listed Below (If Applicable).  COVID-19 Vaccine Information can be found at: [email or web address] For questions related to vaccine distribution  or appointments, please email [email or web address] or call [phone].    Atrial Fibrillation  Atrial fibrillation is a type of heartbeat that is irregular or fast. If you have this condition, your heart beats without any order. This makes it hard foryour heart to pump blood in a normal way. Atrial fibrillation may come and go, or it may become a long-lasting problem. If this condition is not treated, it can put you at higher risk for stroke,heart failure, and other heart problems. What are the causes? This condition may be caused by diseases that damage the heart. They include: High blood pressure. Heart failure. Heart valve disease. Heart surgery. Other causes include: Diabetes. Thyroid disease. Being overweight. Kidney disease. Sometimes the cause is not known. What increases the risk? You are more likely to develop this condition if: You are older. You smoke. You exercise often and very hard. You have a family history of this condition. You are a man. You use drugs. You drink a lot of alcohol. You have lung conditions, such as emphysema, pneumonia, or COPD. You have sleep apnea. What are the signs or symptoms? Common symptoms of this condition include: A feeling that your heart is beating very fast. Chest pain or discomfort. Feeling short of breath. Suddenly feeling light-headed or weak. Getting tired easily during activity. Fainting. Sweating. In some cases, there are no symptoms. How is this treated? Treatment for this condition depends on underlying conditions and how you feel when you have atrial fibrillation. They include: Medicines to: Prevent  blood clots. Treat heart rate or heart rhythm problems. Using devices, such as a pacemaker, to correct heart rhythm problems. Doing surgery to remove the part of the heart that sends bad signals. Closing an area where clots can form in the heart (left atrial appendage). In some cases, your doctor will treat other  underlying conditions. Follow these instructions at home: Medicines Take over-the-counter and prescription medicines only as told by your doctor. Do not take any new medicines without first talking to your doctor. If you are taking blood thinners: Talk with your doctor before you take any medicines that have aspirin or NSAIDs, such as ibuprofen, in them. Take your medicine exactly as told by your doctor. Take it at the same time each day. Avoid activities that could hurt or bruise you. Follow instructions about how to prevent falls. Wear a bracelet that says you are taking blood thinners. Or, carry a card that lists what medicines you take. Lifestyle     Do not use any products that have nicotine or tobacco in them. These include cigarettes, e-cigarettes, and chewing tobacco. If you need help quitting, ask your doctor. Eat heart-healthy foods. Talk with your doctor about the right eating plan for you. Exercise regularly as told by your doctor. Do not drink alcohol. Lose weight if you are overweight. Do not use drugs, including cannabis. General instructions If you have a condition that causes breathing to stop for a short period of time (apnea), treat it as told by your doctor. Keep a healthy weight. Do not use diet pills unless your doctor says they are safe for you. Diet pills may make heart problems worse. Keep all follow-up visits as told by your doctor. This is important. Contact a doctor if: You notice a change in the speed, rhythm, or strength of your heartbeat. You are taking a blood-thinning medicine and you get more bruising. You get tired more easily when you move or exercise. You have a sudden change in weight. Get help right away if:  You have pain in your chest or your belly (abdomen). You have trouble breathing. You have side effects of blood thinners, such as blood in your vomit, poop (stool), or pee (urine), or bleeding that cannot stop. You have any signs of a  stroke. "BE FAST" is an easy way to remember the main warning signs: B - Balance. Signs are dizziness, sudden trouble walking, or loss of balance. E - Eyes. Signs are trouble seeing or a change in how you see. F - Face. Signs are sudden weakness or loss of feeling in the face, or the face or eyelid drooping on one side. A - Arms. Signs are weakness or loss of feeling in an arm. This happens suddenly and usually on one side of the body. S - Speech. Signs are sudden trouble speaking, slurred speech, or trouble understanding what people say. T - Time. Time to call emergency services. Write down what time symptoms started. You have other signs of a stroke, such as: A sudden, very bad headache with no known cause. Feeling like you may vomit (nausea). Vomiting. A seizure. These symptoms may be an emergency. Do not wait to see if the symptoms will go away. Get medical help right away. Call your local emergency services (911 in the [place] Do not drive yourself to the hospital. Summary Atrial fibrillation is a type of heartbeat that is irregular or fast. You are at higher risk of this condition if you smoke, are older, have diabetes,  or are overweight. Follow your doctor's instructions about medicines, diet, exercise, and follow-up visits. Get help right away if you have signs or symptoms of a stroke. Get help right away if you cannot catch your breath, or you have chest pain or discomfort. This information is not intended to replace advice given to you by your health care provider. Make sure you discuss any questions you have with your healthcare provider. Document Revised: 12/04/2018 Document Reviewed: 12/04/2018 Elsevier Patient Education  2022 Elsevier Inc.   Carlena HurlXARELTO (Rivaroxaban) Tablets What is this medication? RIVAROXABAN (ri va ROX a ban) prevents or treats blood clots. It is also used to lower the risk of stroke in people with AFib (atrial fibrillation). It can be used to lower the risk of  heart attack or stroke in people with heart or peripheral artery disease. It belongs to a group of medications called bloodthinners. This medicine may be used for other purposes; ask your health care provider orpharmacist if you have questions. COMMON BRAND NAME(S): Xarelto, Xarelto Starter Pack What should I tell my care team before I take this medication? They need to know if you have any of these conditions: Antiphospholipid antibody syndrome Bleeding disorders Bleeding in the brain Blood clots Kidney disease Liver disease Prosthetic heart valve Recent or planned spinal or epidural procedure Stomach bleeding Take medications that treat or prevent blood clots An unusual or allergic reaction to rivaroxaban, other medications, foods, dyes, or preservatives Pregnant or trying to get pregnant Breast-feeding How should I use this medication? Take this medication by mouth. For your therapy to work as well as possible, take each dose exactly as prescribed on the prescription label. Do not skip doses. Skipping doses or stopping this medication can increase your risk of a blood clot or stroke. Keep taking this medication unless your care team tellsyou to stop. If you are taking this medication after hip or knee replacement surgery, take it with or without food. If you are taking this medication for atrial fibrillation, take it with your evening meal. If you are taking this medication to treat blood clots, take it with food at the same time each day. If you are taking this medication for coronary artery disease or peripheral artery disease, take it with or without food at the same time every day. If you are unable to swallow your tablet, you may crush the tablet and mix it in applesauce. Then, immediately eat the applesauce. You should eat more foodright after you eat the applesauce containing the crushed tablet. A special MedGuide will be given to you by the pharmacist with eachprescription and refill.  Be sure to read this information carefully each time. Talk to your care team about the use of this medication in children. While it may be prescribed for children as young as newborns for selected conditions,precautions do apply. Overdosage: If you think you have taken too much of this medicine contact apoison control center or emergency room at once. NOTE: This medicine is only for you. Do not share this medicine with others. What if I miss a dose? If you take your medication once a day and miss a dose, take it as soon as you can. If it is almost time for your next dose, take only that dose. Do not takedouble or extra doses. If you are taking this medication twice a day to treat blood clots and miss a dose, take the missed dose as soon as you remember. In this instance, 2 tablets may be taken at  the same time. The next day you should take 1 tablet twice aday. If you are taking this medication twice a day for coronary artery disease or peripheral artery disease and miss a dose, skip it. Take your next dose at the normal time. Do not take extra or 2 doses at the same time to make up for themissed dose. What may interact with this medication? Do not take this medication with any of the following: Defibrotide This medication may also interact with the following: Aspirin and aspirin-like medications Certain antibiotics like erythromycin and clarithromycin Certain medications for fungal infections like ketoconazole and itraconazole Certain medications for seizures like carbamazepine, phenytoin Certain medications that treat or prevent blood clots like warfarin, enoxaparin, dalteparin, apixaban, dabigatran, and edoxaban Conivaptan Indinavir Lopinavir; ritonavir NSAIDS, medications for pain and inflammation, like ibuprofen or naproxen Rifampin Ritonavir SNRIs, medications for depression, like desvenlafaxine, duloxetine, levomilnacipran, venlafaxine SSRIs, medications for depression, like citalopram,  escitalopram, fluoxetine, fluvoxamine, paroxetine, sertraline St. John's wort This list may not describe all possible interactions. Give your health care provider a list of all the medicines, herbs, non-prescription drugs, or dietary supplements you use. Also tell them if you smoke, drink alcohol, or use illegaldrugs. Some items may interact with your medicine. What should I watch for while using this medication? Visit your care team for regular checks on your progress. You may need blood work done while you are taking this medication. Your condition will be monitored carefully while you are receiving this medication. It is importantnot to miss any appointments. Avoid sports and activities that might cause injury while you are using this medication. Severe falls or injuries can cause unseen bleeding. Be careful when using sharp tools or knives. Consider using an Neurosurgeon. Take special care brushing or flossing your teeth. Report any injuries, bruising, or redspots on the skin to your care team. Wear a medical ID bracelet or chain. Carry a card that describes yourcondition. List the medications and doses you take on the card. Tell your dentist and dental surgeon that you are taking this medication. You should not have major dental surgery while on this medication. See your dentist to have a dental exam and fix any dental problems before starting this medication. Take good care of your teeth while on this medication. Make sureyou see your dentist for regular follow-up appointments. If you are going to need surgery or other procedure, tell your care team thatyou are using this medication. Do not become pregnant while taking this medication. Women should inform their care team if they wish to become pregnant or think they might be pregnant. There is potential for serious harm to an unborn child. Talk to your care teamfor more information. What side effects may I notice from receiving this  medication? Side effects that you should report to your care team as soon as possible: Allergic reactions-skin rash, itching, hives, swelling of the face, lips, tongue, or throat Bleeding-bloody or black, tar-like stools, vomiting blood or brown material that looks like coffee grounds, red or dark brown urine, small red or purple spots on skin, unusual bruising or bleeding Bleeding in the brain-severe headache, stiff neck, confusion, dizziness, change in vision, numbness or weakness of the face, arm, or leg, trouble speaking, trouble walking, vomiting Heavy periods This list may not describe all possible side effects. Call your doctor for medical advice about side effects. You may report side effects to FDA at1-800-FDA-1088. Where should I keep my medication? Keep out of the reach of children and  pets. Store at room temperature between 20 and 25 degrees C (68 and 77 degrees F).Get rid of any unused medication after the expiration date. To get rid of medications that are no longer needed or have expired: Take the medication to a medication take-back program. Check your pharmacy or law enforcement to find a location. If you cannot return the medication, check the label or package insert to see if the medication should be thrown out in the garbage or flushed down the toilet. If you are not sure, ask your care team. If it is safe to put it in the trash, empty the medication out of the container. Mix the medication with cat litter, dirt, coffee grounds, or other unwanted substance. Seal the mixture in a bag or container. Put it in the trash. NOTE: This sheet is a summary. It may not cover all possible information. If you have questions about this medicine, talk to your doctor, pharmacist, orhealth care provider.  2022 Elsevier/Gold Standard (2020-06-23 11:02:59)

## 2021-02-15 ENCOUNTER — Ambulatory Visit: Payer: Medicare HMO | Admitting: Nurse Practitioner

## 2021-02-16 ENCOUNTER — Other Ambulatory Visit: Payer: Self-pay

## 2021-02-16 ENCOUNTER — Encounter: Payer: Self-pay | Admitting: Nurse Practitioner

## 2021-02-16 ENCOUNTER — Ambulatory Visit (INDEPENDENT_AMBULATORY_CARE_PROVIDER_SITE_OTHER): Payer: Medicare HMO | Admitting: Nurse Practitioner

## 2021-02-16 VITALS — BP 150/80 | HR 72 | Temp 97.3°F | Resp 16 | Ht 73.0 in | Wt 254.0 lb

## 2021-02-16 DIAGNOSIS — R300 Dysuria: Secondary | ICD-10-CM | POA: Diagnosis not present

## 2021-02-16 DIAGNOSIS — Z0001 Encounter for general adult medical examination with abnormal findings: Secondary | ICD-10-CM

## 2021-02-16 DIAGNOSIS — I10 Essential (primary) hypertension: Secondary | ICD-10-CM | POA: Diagnosis not present

## 2021-02-16 DIAGNOSIS — I5020 Unspecified systolic (congestive) heart failure: Secondary | ICD-10-CM | POA: Diagnosis not present

## 2021-02-16 DIAGNOSIS — L03116 Cellulitis of left lower limb: Secondary | ICD-10-CM | POA: Diagnosis not present

## 2021-02-16 DIAGNOSIS — R0602 Shortness of breath: Secondary | ICD-10-CM

## 2021-02-16 DIAGNOSIS — L209 Atopic dermatitis, unspecified: Secondary | ICD-10-CM | POA: Diagnosis not present

## 2021-02-16 MED ORDER — MUPIROCIN 2 % EX OINT: 1.0000 "application " | TOPICAL_OINTMENT | Freq: Two times a day (BID) | CUTANEOUS | 0 refills | Status: AC

## 2021-02-16 MED ORDER — TRIAMCINOLONE ACETONIDE 0.1 % EX CREA: 1.0000 "application " | TOPICAL_CREAM | Freq: Two times a day (BID) | CUTANEOUS | 1 refills | Status: DC | PRN

## 2021-02-16 NOTE — Progress Notes (Signed)
[facility] [address] [place], [place] [address]  Internal MEDICINE  Office Visit Note  Patient Name: Casey Arias  [id number]  [id number]  Date of Service: 02/16/2021  Chief Complaint  Patient presents with   Medicare Wellness    More blisters on left ankle   Hyperlipidemia   COPD    HPI Khi presents for an annual well visit and physical exam. he has a history of atrial fibrillation, COPD, hyperlipidemia, hypertension, PAD, dilated cardiomyopathy and was diagnosed with HFrEF on 02/14/21. He will see Dr. Mariah Milling again next week. He has been started on metoprolol so far. He has received 2 doses of the COVID vaccine and 1 booster dose. He is retired and lives at home with his wife. He is due for routine lab work but wants to hold off to see if the cardiologist orders any labs first.  Blood pressure is elevated even when rechecked, see vitals, was started on metoprolol a couple of days ago.     Current Medication: Outpatient Encounter Medications as of 02/16/2021  Medication Sig Note   acetaminophen (TYLENOL) 500 MG tablet Take 500 mg by mouth every 4 (four) hours as needed.    albuterol (VENTOLIN HFA) 108 (90 Base) MCG/ACT inhaler Inhale 2 puffs every 4 to 6 hrs as needed for sob    chlorhexidine (PERIDEX) 0.12 % solution Use as directed 15 mLs in the mouth or throat 2 (two) times daily.    diclofenac Sodium (VOLTAREN) 1 % GEL Apply 2 g topically daily.    EPINEPHrine (EPIPEN 2-PAK) 0.3 mg/0.3 mL IJ SOAJ injection Use as directed  as needed for anaphylaxis    Multiple Vitamins-Minerals (MULTIVITAMIN WITH MINERALS) tablet Take 1 tablet by mouth daily.    [EXPIRED] mupirocin ointment (BACTROBAN) 2 % Apply 1 application topically 2 (two) times daily for 7 days.    naproxen sodium (ALEVE) 220 MG tablet Take 220 mg by mouth daily as needed.    rivaroxaban (XARELTO) 20 MG TABS tablet Take 1 tablet (20 mg total) by mouth daily with supper.    [DISCONTINUED] aspirin 81  MG chewable tablet Chew 81 mg by mouth daily. (Patient not taking: Reported on 02/21/2021)    [DISCONTINUED] atorvastatin (LIPITOR) 20 MG tablet Take 1 tablet (20 mg total) by mouth daily. (Patient not taking: Reported on 02/21/2021) 01/14/2021: Twice weekly   [DISCONTINUED] furosemide (LASIX) 40 MG tablet Take 1 tablet (40 mg total) by mouth daily.    [DISCONTINUED] metoprolol succinate (TOPROL-XL) 25 MG 24 hr tablet Take 4 tablets (100 mg) once daily with supper for hearts > 110, take 2 tablets (50 mg) for heart rates <110    [DISCONTINUED] potassium chloride SA (KLOR-CON) 20 MEQ tablet Take 1 tablet (20 mEq total) by mouth daily.    [DISCONTINUED] triamcinolone cream (KENALOG) 0.1 % Apply 1 application topically 2 (two) times daily as needed (irritation). Apply to affected area twice a day as needed    triamcinolone cream (KENALOG) 0.1 % Apply 1 application topically 2 (two) times daily as needed (irritation). Apply to affected area twice a day as needed    No facility-administered encounter medications on file as of 02/16/2021.    Surgical History: Past Surgical History:  Procedure Laterality Date   HERNIA REPAIR     HOLEP-LASER ENUCLEATION OF THE PROSTATE WITH MORCELLATION N/A 01/05/2020   Procedure: HOLEP-LASER ENUCLEATION OF THE PROSTATE WITH MORCELLATION;  Surgeon: Vanna Scotland, MD;  Location: [facility] ORS;  Service: Urology;  Laterality: N/A;  KNEE ARTHROSCOPY Left    LEG SURGERY Left    MANDIBLE SURGERY     TONSILLECTOMY      Medical History: Past Medical History:  Diagnosis Date   Atrial fibrillation (HCC)    COPD (chronic obstructive pulmonary disease) (HCC)    Dilated cardiomyopathy (HCC)    Heart murmur    HFrEF (heart failure with reduced ejection fraction) (HCC)    Hyperlipidemia    Hypertension    PAD (peripheral artery disease) (HCC)    PVC (premature ventricular contraction)     Family History: Family History  Problem Relation Age of Onset   Lymphoma Father     Bladder Cancer Mother    Prostate cancer Paternal Grandfather    Kidney cancer Neg Hx     Social History   Socioeconomic History   Marital status: Married    Spouse name: Not on file   Number of children: Not on file   Years of education: Not on file   Highest education level: Not on file  Occupational History   Not on file  Tobacco Use   Smoking status: Former    Types: Cigarettes   Smokeless tobacco: Never   Tobacco comments:    quit 89yrs ago  Vaping Use   Vaping Use: Never used  Substance and Sexual Activity   Alcohol use: Never   Drug use: Never   Sexual activity: Not on file  Other Topics Concern   Not on file  Social History Narrative   Not on file   Social Determinants of Health   Financial Resource Strain: Low Risk    Difficulty of Paying Living Expenses: Not very hard  Food Insecurity: Not on file  Transportation Needs: Not on file  Physical Activity: Not on file  Stress: Not on file  Social Connections: Not on file  Intimate Partner Violence: Not on file      Review of Systems  Constitutional:  Negative for activity change, appetite change, chills, fatigue, fever and unexpected weight change.  HENT: Negative.  Negative for congestion, ear pain, rhinorrhea, sore throat and trouble swallowing.   Eyes: Negative.   Respiratory: Negative.  Negative for cough, chest tightness, shortness of breath and wheezing.   Cardiovascular: Negative.  Negative for chest pain.  Gastrointestinal: Negative.  Negative for abdominal pain, blood in stool, constipation, diarrhea, nausea and vomiting.  Endocrine: Negative.   Genitourinary: Negative.  Negative for difficulty urinating, dysuria, frequency, hematuria and urgency.  Musculoskeletal: Negative.  Negative for arthralgias, back pain, joint swelling, myalgias and neck pain.  Skin: Negative.  Negative for rash and wound.  Allergic/Immunologic: Negative.  Negative for immunocompromised state.  Neurological: Negative.   Negative for dizziness, seizures, numbness and headaches.  Hematological: Negative.   Psychiatric/Behavioral: Negative.  Negative for behavioral problems, self-injury and suicidal ideas. The patient is not nervous/anxious.    Vital Signs: BP (!) 150/80 Comment: 155/63  Pulse 72   Temp (!) 97.3 F (36.3 C)   Resp 16   Ht 6\' 1"  (1.854 m)   Wt 254 lb (115.2 kg)   SpO2 99%   BMI 33.51 kg/m    Physical Exam Vitals reviewed.  Constitutional:      General: He is awake. He is not in acute distress.    Appearance: Normal appearance. He is well-developed and well-groomed. He is obese. He is not ill-appearing or diaphoretic.  HENT:     Head: Normocephalic and atraumatic.     Right Ear: Tympanic membrane, ear canal and  external ear normal.     Left Ear: Tympanic membrane, ear canal and external ear normal.     Nose: Nose normal. No congestion or rhinorrhea.     Mouth/Throat:     Lips: Pink.     Mouth: Mucous membranes are moist.     Pharynx: Oropharynx is clear. Uvula midline. No oropharyngeal exudate or posterior oropharyngeal erythema.  Eyes:     General: Lids are normal. Vision grossly intact. Gaze aligned appropriately. No scleral icterus.       Right eye: No discharge.        Left eye: No discharge.     Extraocular Movements: Extraocular movements intact.     Conjunctiva/sclera: Conjunctivae normal.     Pupils: Pupils are equal, round, and reactive to light.     Funduscopic exam:    Right eye: Red reflex present.        Left eye: Red reflex present. Neck:     Thyroid: No thyromegaly.     Vascular: No carotid bruit or JVD.     Trachea: No tracheal deviation.  Cardiovascular:     Rate and Rhythm: Normal rate and regular rhythm.     Pulses:          Carotid pulses are 3+ on the right side and 3+ on the left side.      Radial pulses are 2+ on the right side and 2+ on the left side.       Dorsalis pedis pulses are 2+ on the right side and 2+ on the left side.       Posterior  tibial pulses are 2+ on the right side and 2+ on the left side.     Heart sounds: Normal heart sounds. No murmur heard.   No friction rub. No gallop.  Pulmonary:     Effort: Pulmonary effort is normal. No respiratory distress.     Breath sounds: Normal breath sounds. No stridor. No wheezing or rales.  Chest:     Chest wall: No tenderness.  Abdominal:     General: Bowel sounds are normal. There is no distension.     Palpations: Abdomen is soft. There is no mass.     Tenderness: There is no abdominal tenderness. There is no guarding or rebound.  Musculoskeletal:        General: No tenderness or deformity. Normal range of motion.     Cervical back: Normal range of motion and neck supple.  Lymphadenopathy:     Cervical: No cervical adenopathy.  Skin:    General: Skin is warm and dry.     Capillary Refill: Capillary refill takes less than 2 seconds.     Coloration: Skin is not pale.     Findings: No erythema or rash.  Neurological:     Mental Status: He is alert and oriented to person, place, and time.     Cranial Nerves: No cranial nerve deficit.     Motor: No abnormal muscle tone.     Coordination: Coordination normal.     Deep Tendon Reflexes: Reflexes are normal and symmetric.  Psychiatric:        Mood and Affect: Mood normal.        Behavior: Behavior normal. Behavior is cooperative.        Thought Content: Thought content normal.        Judgment: Judgment normal.     Assessment/Plan: 1. Encounter for general adult medical examination with abnormal findings Age-appropriate preventive screenings and vaccinations discussed,  annual physical exam completed. Routine labs for health maintenance will be ordered later, he wants to see if Dr. Lewie Loron orders any labs first. . PHM updated.    2. HFrEF (heart failure with reduced ejection fraction) (HCC) Recent diagnosis as of 8/22. Managed by Dr. Mariah Milling, follow up appointment scheduled for 8/29.   3. Shortness of breath Related to the  heart failure and COPD most likely. Heart failure is being managed by Dr. Mariah Milling. He is not currently on any maintenance inhaler for COPD.   4. Essential hypertension Not controlled yet, Dr. Mariah Milling is working with the patient, recently started him on metoprolol, has another appointment with him on 8/29.   5. Cellulitis of left lower extremity Mild cellulitis starting to develop. Mupirocin ointment prescribed to affected areas as discussed.  - mupirocin ointment (BACTROBAN) 2 %; Apply 1 application topically 2 (two) times daily for 7 days.  Dispense: 22 g; Refill: 0  6. Atopic dermatitis, unspecified type Refill requested, order sent.  - triamcinolone cream (KENALOG) 0.1 %; Apply 1 application topically 2 (two) times daily as needed (irritation). Apply to affected area twice a day as needed  Dispense: 80 g; Refill: 1  7. Dysuria Routine urinalysis done.  - UA/M w/rflx Culture, Routine - Microscopic Examination      General Counseling: keshon markovitz understanding of the findings of todays visit and agrees with plan of treatment. I have discussed any further diagnostic evaluation that may be needed or ordered today. We also reviewed his medications today. he has been encouraged to call the office with any questions or concerns that should arise related to todays visit.    Orders Placed This Encounter  Procedures   Microscopic Examination   UA/M w/rflx Culture, Routine    Meds ordered this encounter  Medications   mupirocin ointment (BACTROBAN) 2 %    Sig: Apply 1 application topically 2 (two) times daily for 7 days.    Dispense:  22 g    Refill:  0   triamcinolone cream (KENALOG) 0.1 %    Sig: Apply 1 application topically 2 (two) times daily as needed (irritation). Apply to affected area twice a day as needed    Dispense:  80 g    Refill:  1    Return in about 3 months (around 05/19/2021) for F/U, med refill, Jadalynn Burr PCP.   Total time spent:30 Minutes Time spent  includes review of chart, medications, test results, and follow up plan with the patient.   [place] Controlled Substance Database was reviewed by me.  This patient was seen by [name], FNP-C in collaboration with Dr. Beverely Risen as a part of collaborative care agreement.  Aniella Wandrey R. Tedd Sias, MSN, FNP-C Internal medicine

## 2021-02-17 LAB — MICROSCOPIC EXAMINATION
Bacteria, UA: NONE SEEN
Casts: NONE SEEN /lpf
Epithelial Cells (non renal): NONE SEEN /hpf (ref 0–10)
WBC, UA: NONE SEEN /hpf (ref 0–5)

## 2021-02-17 LAB — UA/M W/RFLX CULTURE, ROUTINE
Bilirubin, UA: NEGATIVE
Glucose, UA: NEGATIVE
Ketones, UA: NEGATIVE
Leukocytes,UA: NEGATIVE
Nitrite, UA: NEGATIVE
Protein,UA: NEGATIVE
Specific Gravity, UA: 1.007 (ref 1.005–1.030)
Urobilinogen, Ur: 0.2 mg/dL (ref 0.2–1.0)
pH, UA: 6.5 (ref 5.0–7.5)
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
## 2021-02-21 ENCOUNTER — Ambulatory Visit: Payer: Medicare HMO | Admitting: Cardiovascular Disease

## 2021-02-21 ENCOUNTER — Encounter: Payer: Self-pay | Admitting: Cardiovascular Disease

## 2021-02-21 ENCOUNTER — Other Ambulatory Visit: Payer: Self-pay

## 2021-02-21 VITALS — BP 128/80 | HR 85 | Ht 73.0 in | Wt 245.4 lb

## 2021-02-21 DIAGNOSIS — E782 Mixed hyperlipidemia: Secondary | ICD-10-CM | POA: Diagnosis not present

## 2021-02-21 DIAGNOSIS — I5020 Unspecified systolic (congestive) heart failure: Secondary | ICD-10-CM

## 2021-02-21 DIAGNOSIS — I420 Dilated cardiomyopathy: Secondary | ICD-10-CM | POA: Diagnosis not present

## 2021-02-21 DIAGNOSIS — Z87891 Personal history of nicotine dependence: Secondary | ICD-10-CM | POA: Diagnosis not present

## 2021-02-21 DIAGNOSIS — I493 Ventricular premature depolarization: Secondary | ICD-10-CM | POA: Diagnosis not present

## 2021-02-21 DIAGNOSIS — J432 Centrilobular emphysema: Secondary | ICD-10-CM

## 2021-02-21 DIAGNOSIS — I4819 Other persistent atrial fibrillation: Secondary | ICD-10-CM

## 2021-02-21 DIAGNOSIS — I739 Peripheral vascular disease, unspecified: Secondary | ICD-10-CM | POA: Diagnosis not present

## 2021-02-21 MED ORDER — FUROSEMIDE 40 MG PO TABS: 40.0000 mg | ORAL_TABLET | Freq: Every day | ORAL | 3 refills | Status: DC | PRN

## 2021-02-21 MED ORDER — POTASSIUM CHLORIDE CRYS ER 20 MEQ PO TBCR: 20.0000 meq | EXTENDED_RELEASE_TABLET | Freq: Every day | ORAL | 3 refills | Status: DC

## 2021-02-21 MED ORDER — METOPROLOL SUCCINATE ER 25 MG PO TB24: 75.0000 mg | ORAL_TABLET | Freq: Every day | ORAL | 3 refills | Status: DC

## 2021-02-21 MED ORDER — DILTIAZEM HCL 30 MG PO TABS: 30.0000 mg | ORAL_TABLET | Freq: Three times a day (TID) | ORAL | 1 refills | Status: DC | PRN

## 2021-02-21 NOTE — Patient Instructions (Addendum)
Medication Instructions:  Lasix 40 mg daily as needed for 3 pounds weight gain Take potassium 20 mEq With lasix   Increase metoprolol succinate up to 75 mg daily  For atrial fibrillation spells, Take diltiazem 30 up to 60 mg as needed  If you need a refill on your cardiac medications before your next appointment, please call your pharmacy.   Lab work: No new labs needed  Testing/Procedures: No new testing needed  Follow-Up: At [facility], you and your health needs are our priority.  As part of our continuing mission to provide you with exceptional heart care, we have created designated Provider Care Teams.  These Care Teams include your primary Cardiologist (physician) and Advanced Practice Providers (APPs -  Physician Assistants and Nurse Practitioners) who all work together to provide you with the care you need, when you need it.  You will need a follow up appointment in 1 months  Providers on your designated Care Team:   Nicolasa Ducking, NP Eula Listen, PA-C Marisue Ivan, PA-C [place], [place]  COVID-19 Vaccine Information can be found at: [email or web address] For questions related to vaccine distribution or appointments, please email [email or web address] or call [phone].

## 2021-02-28 ENCOUNTER — Encounter: Payer: Self-pay | Admitting: Nurse Practitioner

## 2021-03-08 ENCOUNTER — Other Ambulatory Visit: Payer: Self-pay | Admitting: *Deleted

## 2021-03-08 NOTE — Patient Outreach (Signed)
[facility]) Care Management  03/08/2021  KAIS MONJE [birth year] [id number]   [facility] fourth Unsuccessful outreach with case closure    Referred to [facility] for [employer] medicare screening   Outreach attempt to the home number [phone] No answer. [facility] RN CM left HIPAA [facility] Portability and Accountability Act) compliant voicemail message along with CM's contact info.      Plan: [facility] RN CM completed case closure  Unsuccessful outreach letter sent on 01/25/21 Unsuccessful outreach on 01/25/21, 02/01/21, 03/08/21     [name], RN, BSN, CCM [facility] Telephonic Care Management Care Coordinator Office number [phone] Mobile number [phone] Main [facility] number [phone] Fax number [phone]

## 2021-03-10 ENCOUNTER — Other Ambulatory Visit: Payer: Self-pay

## 2021-03-11 ENCOUNTER — Telehealth: Payer: Self-pay | Admitting: Pharmacist

## 2021-03-11 NOTE — Progress Notes (Addendum)
Chronic Care Management Pharmacy Assistant   Name: WING SCHOCH  MRN: [id number] DOB: 11/29/1945  Reason for Encounter: Disease State For HLD.    Conditions to be addressed/monitored: PVCs/Palpitations, Asthma, Shortness of Breath, HLD  Recent office visits:  None since 01/14/21  Recent consult visits: 02/21/21 Cardiology Mariah Milling, Tollie Pizza, MD. For follow-up. STARTED Diltiazem 30 mg 3 times daily PRN. CHANGED Atorvastatin to 20 mg 2 times weekly. CHANGED Furosemide to 40 mg Daily PRN, Take with potassium as needed for 3 pounds weight gain and CHANGED Metoprolol Succinate to 75 mg Oral Daily, Take 4 tablets (100 mg) once daily with supper for hearts > 110, take 2 tablets (50 mg) for heart rates <110  STOPPED Aspirin. 02/14/21 Cardiology Gollan, Tollie Pizza, MD. For follow-up. STARTED Furosemide 40 mg daily, Potassium Chloride Crys ER 20 MEQ 20 MEQ daily, Rivaroxaban 20 mg daily. CHANGED Metoprolol Succinate 25 mg to Take 4 tablets (100 mg) once daily with supper for hearts > 110, take 2 tablets (50 mg) for heart rates <110 STOPPED Losartan.  Hospital visits:  None since 01/14/21  Medications: Outpatient Encounter Medications as of 03/11/2021  Medication Sig   acetaminophen (TYLENOL) 500 MG tablet Take 500 mg by mouth every 4 (four) hours as needed.   albuterol (VENTOLIN HFA) 108 (90 Base) MCG/ACT inhaler Inhale 2 puffs every 4 to 6 hrs as needed for sob   atorvastatin (LIPITOR) 20 MG tablet Take 20 mg by mouth 2 (two) times a week.   chlorhexidine (PERIDEX) 0.12 % solution Use as directed 15 mLs in the mouth or throat 2 (two) times daily.   diclofenac Sodium (VOLTAREN) 1 % GEL Apply 2 g topically daily.   diltiazem (CARDIZEM) 30 MG tablet Take 1 tablet (30 mg total) by mouth 3 (three) times daily as needed.   EPINEPHrine (EPIPEN 2-PAK) 0.3 mg/0.3 mL IJ SOAJ injection Use as directed  as needed for anaphylaxis   furosemide (LASIX) 40 MG tablet Take 1 tablet (40 mg total) by mouth daily  as needed. Take with potassium as needed for 3 pounds weight gain   metoprolol succinate (TOPROL-XL) 25 MG 24 hr tablet Take 3 tablets (75 mg total) by mouth daily. Take 4 tablets (100 mg) once daily with supper for hearts > 110, take 2 tablets (50 mg) for heart rates <110   Multiple Vitamins-Minerals (MULTIVITAMIN WITH MINERALS) tablet Take 1 tablet by mouth daily.   naproxen sodium (ALEVE) 220 MG tablet Take 220 mg by mouth daily as needed.   potassium chloride SA (KLOR-CON) 20 MEQ tablet Take 1 tablet (20 mEq total) by mouth daily.   rivaroxaban (XARELTO) 20 MG TABS tablet Take 1 tablet (20 mg total) by mouth daily with supper.   triamcinolone cream (KENALOG) 0.1 % Apply 1 application topically 2 (two) times daily as needed (irritation). Apply to affected area twice a day as needed   No facility-administered encounter medications on file as of 03/11/2021.   03/11/2021 Name: JAMESON TORMEY MRN: [id number] DOB: 1945-11-30 OSLO HUNTSMAN is a 75 y.o. year old male who is a primary care patient of [name], NP.  Comprehensive medication review performed; Spoke to patient regarding cholesterol  Lipid Panel    Component Value Date/Time   CHOL 210 (H) 11/06/2017 0951   TRIG 84 11/06/2017 0951   HDL 49 11/06/2017 0951   LDLCALC 144 (H) 11/06/2017 0951    10-year ASCVD risk score: The ASCVD Risk score (Arnett DK, et al., 2019) failed to  calculate for the following reasons:   Cannot find a previous HDL lab   Cannot find a previous total cholesterol lab  Current antihyperlipidemic regimen:  Atorvastatin 20 mg 2 times weekly  Previous antihyperlipidemic medications tried: N/A.   ASCVD risk enhancing conditions: age >21  What recent interventions/DTPs have been made by any provider to improve Cholesterol control since last CPP Visit: None.   Any recent hospitalizations or ED visits since last visit with CPP? Patient stated no.   What diet changes have been made to improve  Cholesterol?  He stated he recently loss weight. He stated his appetite is good and he eats a well rounded diet.    What exercise is being done to improve Cholesterol?  Patient stated he is active daily, He stated he   Adherence Review: Does the patient have >5 day gap between last estimated fill dates? Per misc rpts, no.   Patient was reminded of his upcoming appointment with his PCP.   Care Gaps:Patient is due for her AWV.   Star Rating Drugs: Atorvastatin 20 mg 02/15/21 90 DS.   Follow-Up:Pharmacist Review  10 minutes spent in review, coordination, and documentation.  AWV was done in August 2022, already has another one scheduled for August 2023.  Reviewed by: Willa Frater, PharmD Clinical Pharmacist [phone]   Hulen Luster, RMA Clinical Pharmacist Assistant [phone]

## 2021-03-15 ENCOUNTER — Encounter: Payer: Self-pay | Admitting: *Deleted

## 2021-03-15 ENCOUNTER — Other Ambulatory Visit: Payer: Self-pay | Admitting: *Deleted

## 2021-03-15 DIAGNOSIS — J449 Chronic obstructive pulmonary disease, unspecified: Secondary | ICD-10-CM
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
## 2021-03-17 ENCOUNTER — Other Ambulatory Visit: Payer: Self-pay | Admitting: *Deleted

## 2021-03-17 NOTE — Patient Outreach (Addendum)
[facility]) Care Management  03/17/2021  Casey Arias 1945/11/16 [id number]   [facility] Care coordination- EMMIs  Sent EMMI education via mail as requested on COPD, COPD diet PVS, BPH, CHF and Afib     Plan [facility] RN CM will follow up with patient within the next 30 business days  Oneita Allmon L. Noelle Penner, RN, BSN, CCM [facility] Telephonic Care Management Care Coordinator Office number [phone] Mobile number [phone]  Main [facility] number [phone] Fax number [phone]

## 2021-03-25 DIAGNOSIS — I10 Essential (primary) hypertension: Secondary | ICD-10-CM | POA: Diagnosis not present

## 2021-03-25 DIAGNOSIS — E785 Hyperlipidemia, unspecified: Secondary | ICD-10-CM | POA: Diagnosis not present

## 2021-03-25 DIAGNOSIS — J449 Chronic obstructive pulmonary disease, unspecified: Secondary | ICD-10-CM | POA: Diagnosis not present
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
## 2021-04-06 ENCOUNTER — Ambulatory Visit: Payer: Medicare HMO | Admitting: Cardiovascular Disease

## 2021-04-06 DIAGNOSIS — I4819 Other persistent atrial fibrillation: Secondary | ICD-10-CM

## 2021-04-06 DIAGNOSIS — I739 Peripheral vascular disease, unspecified: Secondary | ICD-10-CM

## 2021-04-06 DIAGNOSIS — Z87891 Personal history of nicotine dependence: Secondary | ICD-10-CM

## 2021-04-06 DIAGNOSIS — I493 Ventricular premature depolarization: Secondary | ICD-10-CM

## 2021-04-06 DIAGNOSIS — E782 Mixed hyperlipidemia: Secondary | ICD-10-CM

## 2021-04-06 DIAGNOSIS — I428 Other cardiomyopathies: Secondary | ICD-10-CM

## 2021-04-06 DIAGNOSIS — I420 Dilated cardiomyopathy: Secondary | ICD-10-CM

## 2021-04-06 DIAGNOSIS — J432 Centrilobular emphysema: Secondary | ICD-10-CM

## 2021-04-10 ENCOUNTER — Other Ambulatory Visit: Payer: Self-pay | Admitting: Cardiovascular Disease

## 2021-04-11 ENCOUNTER — Ambulatory Visit: Payer: Medicare HMO | Admitting: Cardiovascular Disease

## 2021-04-11 ENCOUNTER — Other Ambulatory Visit: Payer: Self-pay

## 2021-04-11 ENCOUNTER — Encounter: Payer: Self-pay | Admitting: Cardiovascular Disease

## 2021-04-11 VITALS — BP 130/80 | HR 59 | Ht 73.0 in | Wt 240.2 lb

## 2021-04-11 DIAGNOSIS — J432 Centrilobular emphysema: Secondary | ICD-10-CM

## 2021-04-11 DIAGNOSIS — Z79899 Other long term (current) drug therapy: Secondary | ICD-10-CM | POA: Diagnosis not present

## 2021-04-11 DIAGNOSIS — I493 Ventricular premature depolarization: Secondary | ICD-10-CM

## 2021-04-11 DIAGNOSIS — Z87891 Personal history of nicotine dependence: Secondary | ICD-10-CM

## 2021-04-11 DIAGNOSIS — E782 Mixed hyperlipidemia: Secondary | ICD-10-CM | POA: Diagnosis not present

## 2021-04-11 DIAGNOSIS — I739 Peripheral vascular disease, unspecified: Secondary | ICD-10-CM

## 2021-04-11 DIAGNOSIS — Z5181 Encounter for therapeutic drug level monitoring: Secondary | ICD-10-CM | POA: Diagnosis not present

## 2021-04-11 DIAGNOSIS — I420 Dilated cardiomyopathy: Secondary | ICD-10-CM

## 2021-04-11 MED ORDER — ATORVASTATIN CALCIUM 20 MG PO TABS: 20.0000 mg | ORAL_TABLET | Freq: Every day | ORAL | 3 refills | Status: DC

## 2021-04-11 NOTE — Patient Instructions (Addendum)
Medication Instructions:  No changes  Hold one of the metoprolol for low blood pressure  If you need a refill on your cardiac medications before your next appointment, please call your pharmacy.   Lab work: Lipid & LFTs   Labs will be mailed, abnormal will be called by phone  Testing/Procedures: No new testing needed  Follow-Up: At [facility], you and your health needs are our priority.  As part of our continuing mission to provide you with exceptional heart care, we have created designated Provider Care Teams.  These Care Teams include your primary Cardiologist (physician) and Advanced Practice Providers (APPs -  Physician Assistants and Nurse Practitioners) who all work together to provide you with the care you need, when you need it.  You will need a follow up appointment in 6 months  Providers on your designated Care Team:   Nicolasa Ducking, NP Eula Listen, PA-C Marisue Ivan, PA-C [place], [place]  COVID-19 Vaccine Information can be found at: [email or web address] For questions related to vaccine distribution or appointments, please email [email or web address] or call [phone].
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
## 2021-04-12 LAB — HEPATIC FUNCTION PANEL
ALT: 14 IU/L (ref 0–44)
AST: 21 IU/L (ref 0–40)
Albumin: 4.6 g/dL (ref 3.7–4.7)
Alkaline Phosphatase: 83 IU/L (ref 44–121)
Bilirubin Total: 0.5 mg/dL (ref 0.0–1.2)
Bilirubin, Direct: 0.13 mg/dL (ref 0.00–0.40)
Total Protein: 7.2 g/dL (ref 6.0–8.5)

## 2021-04-12 LAB — LIPID PANEL
Chol/HDL Ratio: 3.9 ratio (ref 0.0–5.0)
Cholesterol, Total: 164 mg/dL (ref 100–199)
HDL: 42 mg/dL (ref >39)
LDL Chol Calc (NIH): 110 mg/dL — ABNORMAL HIGH (ref 0–99)
Triglycerides: 62 mg/dL (ref 0–149)
VLDL Cholesterol Cal: 12 mg/dL (ref 5–40)

## 2021-04-14 ENCOUNTER — Other Ambulatory Visit: Payer: Self-pay

## 2021-04-14 ENCOUNTER — Other Ambulatory Visit: Payer: Self-pay | Admitting: *Deleted

## 2021-04-14 NOTE — Patient Outreach (Signed)
[facility]) Care Management  04/14/2021  Casey Arias 11-Sep-1945 [id number]  [facility] outreach to referred Join patient   Casey Arias was referred per the [employer] program on 12/22/20  His [facility] case was closed on 03/08/21 when he was not able to be reached after 3 outreaches and an unsuccessful outreach. He returned a call to [facility] main office and was referred after receiving his case closure letter   [employer]     Outreach to [phone] Casey Arias was able to verify HIPAA identifiers Casey Arias confirms concerns with [facility] outreaches being labeled as potential spam  Today he confirms he is able to see potential spam and RN CM office number He will enter in RN CM name in connection with her office number for the future [facility] program and services reviewed with him He agrees to [facility] services      Initial assessment Casey Arias reports he is doing well He reports he feels he  is managing his COPD and mobility issues the best he can He has completed a Cardiology office visit on 04/11/21 and wants to work on his LDL and weight management  Present weight is 240 lbs He reports a hx of 258 lbs     Plan Patient agrees to care plan and follow up within the next 30 business days     Casey Arias L. Noelle Penner, RN, BSN, CCM [facility] Telephonic Care Management Care Coordinator Office number [phone] Main [facility] number [phone] Fax number [phone]

## 2021-04-15 ENCOUNTER — Telehealth: Payer: Self-pay

## 2021-04-15 DIAGNOSIS — E7800 Pure hypercholesterolemia, unspecified: Secondary | ICD-10-CM

## 2021-04-15 DIAGNOSIS — Z79899 Other long term (current) drug therapy: Secondary | ICD-10-CM

## 2021-04-15 MED ORDER — ATORVASTATIN CALCIUM 40 MG PO TABS: 40.0000 mg | ORAL_TABLET | Freq: Every day | ORAL | 3 refills | Status: DC

## 2021-04-15 MED ORDER — EZETIMIBE 10 MG PO TABS: 10.0000 mg | ORAL_TABLET | Freq: Every day | ORAL | 3 refills | Status: DC

## 2021-04-15 NOTE — Telephone Encounter (Signed)
Able to reach pt regarding his recent lab work, Dr. Mariah Milling had a chance to review their results and advised   "LDL 110, goal is 60 or less  Would recommend to increase a atorvastatin to 40 daily, add Zetia 10 mg daily, repeat lipid panel 3 months "  Pt agree to increase lipitor to 40 mg daily and add in Zetia 10 mg daily, [facility]pt [facility]. Pt thankful for calling, all questions were address and no additional concerns at this time. Agreeable to plan, will call back for anything further.

## 2021-05-12 ENCOUNTER — Other Ambulatory Visit: Payer: Self-pay

## 2021-05-12 ENCOUNTER — Other Ambulatory Visit: Payer: Self-pay | Admitting: Internal Medicine

## 2021-05-12 DIAGNOSIS — I5020 Unspecified systolic (congestive) heart failure: Secondary | ICD-10-CM

## 2021-05-12 DIAGNOSIS — I10 Essential (primary) hypertension: Secondary | ICD-10-CM

## 2021-05-12 MED ORDER — METOPROLOL SUCCINATE ER 25 MG PO TB24: 75.0000 mg | ORAL_TABLET | Freq: Every day | ORAL | 3 refills | Status: DC

## 2021-05-12 NOTE — Telephone Encounter (Signed)
*  STAT* If patient is at the pharmacy, call can be transferred to refill team.   1. Which medications need to be refilled? (please list name of each medication and dose if known) Metoprolol  2. Which pharmacy/location (including street and city if local pharmacy) is medication to be sent to? [facility] [address]  3. Do they need a 30 day or 90 day supply? 90   

## 2021-05-16 ENCOUNTER — Other Ambulatory Visit: Payer: Self-pay | Admitting: *Deleted

## 2021-05-16 NOTE — Patient Outreach (Signed)
[facility]) Care Management Telephonic RN Care Manager Note     05/16/2021 Name:  Casey Arias        MRN:  [id number]   DOB:  August 03, 1945   Summary: Return outreach from Mr Casey Arias - Monthly follow up completed with Atrial fibrillation review  He is able to verify his HIPAA identifiers   Subjective: Casey Arias is an 75 y.o. year old male who is a primary patient of [name], NP. The care management team was consulted for assistance with care management and/or care coordination needs.     Telephonic RN Care Manager completed Telephone Visit today.    Past Medical History:  Diagnosis Date   Atrial fibrillation (HCC)    COPD (chronic obstructive pulmonary disease) (HCC)    Dilated cardiomyopathy (HCC)    Heart murmur    HFrEF (heart failure with reduced ejection fraction) (HCC)    Hyperlipidemia    Hypertension    PAD (peripheral artery disease) (HCC)    PVC (premature ventricular contraction)      Objective:  Current Outpatient Medications on File Prior to Visit  Medication Sig Dispense Refill   albuterol (VENTOLIN HFA) 108 (90 Base) MCG/ACT inhaler Inhale 2 puffs every 4 to 6 hrs as needed for sob 18 g 5   atorvastatin (LIPITOR) 40 MG tablet Take 1 tablet (40 mg total) by mouth daily. 90 tablet 3   chlorhexidine (PERIDEX) 0.12 % solution Use as directed 15 mLs in the mouth or throat 2 (two) times daily. 1893 mL 1   diclofenac Sodium (VOLTAREN) 1 % GEL Apply 2 g topically daily. 100 g 2   diltiazem (CARDIZEM) 30 MG tablet TAKE 1 TABLET (30 MG TOTAL) BY MOUTH 3 (THREE) TIMES DAILY AS NEEDED. 90 tablet 1   EPINEPHrine (EPIPEN 2-PAK) 0.3 mg/0.3 mL IJ SOAJ injection Use as directed  as needed for anaphylaxis 1 Device 1   ezetimibe (ZETIA) 10 MG tablet Take 1 tablet (10 mg total) by mouth daily. 90 tablet 3   furosemide (LASIX) 40 MG tablet Take 1 tablet (40 mg total) by mouth daily as needed. Take with potassium as needed for 3 pounds  weight gain 90 tablet 3   metoprolol succinate (TOPROL-XL) 25 MG 24 hr tablet Take 3 tablets (75 mg total) by mouth daily. Take 4 tablets (100 mg) once daily with supper for hearts > 110, take 2 tablets (50 mg) for heart rates <110 270 tablet 3   Multiple Vitamins-Minerals (MULTIVITAMIN WITH MINERALS) tablet Take 1 tablet by mouth daily.     potassium chloride SA (KLOR-CON) 20 MEQ tablet Take 1 tablet (20 mEq total) by mouth daily. 90 tablet 3   rivaroxaban (XARELTO) 20 MG TABS tablet Take 1 tablet (20 mg total) by mouth daily with supper. 30 tablet 6   triamcinolone cream (KENALOG) 0.1 % Apply 1 application topically 2 (two) times daily as needed (irritation). Apply to affected area twice a day as needed 80 g 1   No current facility-administered medications on file prior to visit.    Plans Patient agrees to care plan and follow up within the next 30 business days   Marilin Kofman L. Noelle Penner, RN, BSN, CCM [facility] Telephonic Care Management Care Coordinator Office number [phone] Main [facility] number [phone] Fax number [phone]

## 2021-05-16 NOTE — Patient Outreach (Signed)
[facility]) Care Management Telephonic RN Care Manager Note   05/16/2021 Name:  Casey Arias MRN:  [id number] DOB:  03-22-1946  Summary: [facility] Unsuccessful outreach Outreach attempt to the listed at the preferred outreach number in EPIC [phone] No answer. [facility] RN CM left HIPAA [facility] Portability and Accountability Act) compliant voicemail message along with CM's contact info.    Subjective: Casey Arias is an 75 y.o. year old male who is a primary patient of Casey Kuster, NP. The care management team was consulted for assistance with care management and/or care coordination needs.    Telephonic RN Care Manager completed Telephone Visit today.   Objective:  Medications Reviewed Today     Reviewed by Clinton Gallant, RN (Registered Nurse) on 04/14/21 at 1127  Med List Status: <None>   Medication Order Taking? Sig Documenting Provider Last Dose Status Informant  acetaminophen (TYLENOL) 500 MG tablet [id number] No Take 500 mg by mouth every 4 (four) hours as needed. [provider] Taking Active   albuterol (VENTOLIN HFA) 108 (90 Base) MCG/ACT inhaler [id number] No Inhale 2 puffs every 4 to 6 hrs as needed for sob [place], Coralee [place], NP Taking Active   atorvastatin (LIPITOR) 20 MG tablet [id number]  Take 1 tablet (20 mg total) by mouth daily. Antonieta Iba, MD  Active   chlorhexidine (PERIDEX) 0.12 % solution [id number] No Use as directed 15 mLs in the mouth or throat 2 (two) times daily. Theotis Burrow, NP Taking Active   diclofenac Sodium (VOLTAREN) 1 % GEL [id number] No Apply 2 g topically daily. Theotis Burrow, NP Taking Active   diltiazem (CARDIZEM) 30 MG tablet [id number]  TAKE 1 TABLET (30 MG TOTAL) BY MOUTH 3 (THREE) TIMES DAILY AS NEEDED. Antonieta Iba, MD  Active   EPINEPHrine (EPIPEN 2-PAK) 0.3 mg/0.3 mL IJ SOAJ injection [id number] No Use as directed  as needed for anaphylaxis Carlean Jews, NP Taking  Active Self  furosemide (LASIX) 40 MG tablet [id number] No Take 1 tablet (40 mg total) by mouth daily as needed. Take with potassium as needed for 3 pounds weight gain Gollan, Tollie Pizza, MD Taking Active   metoprolol succinate (TOPROL-XL) 25 MG 24 hr tablet [id number] No Take 3 tablets (75 mg total) by mouth daily. Take 4 tablets (100 mg) once daily with supper for hearts > 110, take 2 tablets (50 mg) for heart rates <110 Gollan, Tollie Pizza, MD Taking Active   Multiple Vitamins-Minerals (MULTIVITAMIN WITH MINERALS) tablet [id number] No Take 1 tablet by mouth daily. [provider] Taking Active Self  naproxen sodium (ALEVE) 220 MG tablet [id number] No Take 220 mg by mouth daily as needed. [provider] Taking Active   potassium chloride SA (KLOR-CON) 20 MEQ tablet [id number] No Take 1 tablet (20 mEq total) by mouth daily. Antonieta Iba, MD Taking Active   rivaroxaban (XARELTO) 20 MG TABS tablet [id number] No Take 1 tablet (20 mg total) by mouth daily with supper. Antonieta Iba, MD Taking Active   triamcinolone cream (KENALOG) 0.1 % [id number] No Apply 1 application topically 2 (two) times daily as needed (irritation). Apply to affected area twice a day as needed Casey Kuster, NP Taking Active              SDOH:  (Social Determinants of Health) assessments and interventions performed:    Care Plan  Review of patient past medical history, allergies, medications, health status, including review of  consultants reports, laboratory and other test data, was performed as part of comprehensive evaluation for care management services.   Care Plan : COPD (Adult)  Updates made by Clinton Gallant, RN since 05/16/2021 12:00 AM     Problem: Disease Progression (COPD)   Priority: Medium  Onset Date: 03/15/2021     Long-Range Goal: Disease Progression Minimized or Managed   Start Date: 03/15/2021  Expected End Date: 06/24/2021  This Visit's Progress: On track  Priority:  Medium  Note:      Task: Alleviate Barriers to COPD Management   Due Date: 06/24/2021  Outcome: Positive  Responsible User: Clinton Gallant, RN  Note:   Care Management Activities:  05/16/21 unsuccessful outreach 04/15/21 Doing well  03/15/21- activity or exercise based on tolerance encouraged - barriers to treatment managed - breathing techniques encouraged - communicable disease prevention promoted - medication-adherence assessment completed - quality of sleep assessed - rescue (action) plan reviewed - screen for functional limitations completed and reviewed - self-awareness of symptom triggers encouraged    Notes:       Goals Addressed               This Visit's Progress     Patient Stated     Track and Manage My Symptoms-COPD [facility]) (pt-stated)        Timeframe:  Long-Range Goal Priority:  Medium Start Date:     03/15/21                        Expected End Date:     06/24/21                  Follow Up Date 05/25/21   Barriers: Knowledge   - follow rescue plan if symptoms flare-up - keep follow-up appointments  Notes:  05/16/21 unsuccessful outreach 04/15/21 doing well 03/15/21 pt reports he follows up with Dr Welton Flakes regularly and follows a rescue plan when he has flare ups        Plan: [facility] RN CM scheduled this patient for another call attempt within 4-7 business day Unsuccessful outreach on 05/16/21  Casey Arias L. Casey Penner, RN, BSN, CCM [facility] Telephonic Care Management Care Coordinator Office number [phone] Main [facility] number [phone] Fax number [phone]

## 2021-05-18 ENCOUNTER — Other Ambulatory Visit: Payer: Self-pay

## 2021-05-18 ENCOUNTER — Encounter: Payer: Self-pay | Admitting: Nurse Practitioner

## 2021-05-18 ENCOUNTER — Ambulatory Visit (INDEPENDENT_AMBULATORY_CARE_PROVIDER_SITE_OTHER): Payer: Medicare HMO | Admitting: Nurse Practitioner

## 2021-05-18 VITALS — BP 132/64 | HR 70 | Temp 98.1°F | Resp 16 | Ht 73.0 in | Wt 243.6 lb

## 2021-05-18 DIAGNOSIS — J441 Chronic obstructive pulmonary disease with (acute) exacerbation: Secondary | ICD-10-CM

## 2021-05-18 DIAGNOSIS — I10 Essential (primary) hypertension: Secondary | ICD-10-CM | POA: Diagnosis not present

## 2021-05-18 DIAGNOSIS — I5020 Unspecified systolic (congestive) heart failure: Secondary | ICD-10-CM | POA: Diagnosis not present

## 2021-05-18 MED ORDER — AMOXICILLIN-POT CLAVULANATE 875-125 MG PO TABS: 1.0000 | ORAL_TABLET | Freq: Two times a day (BID) | ORAL | 0 refills | Status: DC

## 2021-05-18 NOTE — Progress Notes (Signed)
[facility] [address] [place], [place] [address]  Internal MEDICINE  Office Visit Note  Patient Name: Casey Arias  [id number]  [id number]  Date of Service: 05/18/2021  Chief Complaint  Patient presents with   Follow-up    Chest congestion for 2 weeks   Hyperlipidemia   Hypertension   COPD   Medication Refill    HPI Hendrick presents for a follow up visit for hypertension, COPD and he has also had chest congestion for 2 weeks. He has seen Dr. Mariah Milling a couple more times since his last office visit here. He is now to follow up with cardiology every 6 months. He reports that he cut out caffeine including coffee and tea which has helped. His blood pressure is under much better control. He reports that his breathing is stable except that he had a head cold a couple of weeks ago and developed significant chest congestion. He is worried about pneumonia, which he has had 3 times, and COPD exacerbation. He denies any wheezing just states that it feels difficult to move air and breathe deeply.     Current Medication: Outpatient Encounter Medications as of 05/18/2021  Medication Sig   amoxicillin-clavulanate (AUGMENTIN) 875-125 MG tablet Take 1 tablet by mouth 2 (two) times daily.   albuterol (VENTOLIN HFA) 108 (90 Base) MCG/ACT inhaler Inhale 2 puffs every 4 to 6 hrs as needed for sob   atorvastatin (LIPITOR) 40 MG tablet Take 1 tablet (40 mg total) by mouth daily.   chlorhexidine (PERIDEX) 0.12 % solution Use as directed 15 mLs in the mouth or throat 2 (two) times daily.   diclofenac Sodium (VOLTAREN) 1 % GEL Apply 2 g topically daily.   diltiazem (CARDIZEM) 30 MG tablet TAKE 1 TABLET (30 MG TOTAL) BY MOUTH 3 (THREE) TIMES DAILY AS NEEDED.   EPINEPHrine (EPIPEN 2-PAK) 0.3 mg/0.3 mL IJ SOAJ injection Use as directed  as needed for anaphylaxis   ezetimibe (ZETIA) 10 MG tablet Take 1 tablet (10 mg total) by mouth daily.   furosemide (LASIX) 40 MG tablet Take 1 tablet  (40 mg total) by mouth daily as needed. Take with potassium as needed for 3 pounds weight gain   metoprolol succinate (TOPROL-XL) 25 MG 24 hr tablet Take 3 tablets (75 mg total) by mouth daily. Take 4 tablets (100 mg) once daily with supper for hearts > 110, take 2 tablets (50 mg) for heart rates <110   Multiple Vitamins-Minerals (MULTIVITAMIN WITH MINERALS) tablet Take 1 tablet by mouth daily.   potassium chloride SA (KLOR-CON) 20 MEQ tablet Take 1 tablet (20 mEq total) by mouth daily.   rivaroxaban (XARELTO) 20 MG TABS tablet Take 1 tablet (20 mg total) by mouth daily with supper.   triamcinolone cream (KENALOG) 0.1 % Apply 1 application topically 2 (two) times daily as needed (irritation). Apply to affected area twice a day as needed   [DISCONTINUED] acetaminophen (TYLENOL) 500 MG tablet Take 500 mg by mouth every 4 (four) hours as needed. (Patient not taking: Reported on 05/18/2021)   [DISCONTINUED] losartan (COZAAR) 25 MG tablet Take 25 mg by mouth daily. (Patient not taking: Reported on 05/18/2021)   [DISCONTINUED] naproxen sodium (ALEVE) 220 MG tablet Take 220 mg by mouth daily as needed. (Patient not taking: Reported on 05/18/2021)   No facility-administered encounter medications on file as of 05/18/2021.    Surgical History: Past Surgical History:  Procedure Laterality Date   HERNIA REPAIR     HOLEP-LASER ENUCLEATION OF THE PROSTATE  WITH MORCELLATION N/A 01/05/2020   Procedure: HOLEP-LASER ENUCLEATION OF THE PROSTATE WITH MORCELLATION;  Surgeon: Vanna Scotland, MD;  Location: [facility] ORS;  Service: Urology;  Laterality: N/A;   KNEE ARTHROSCOPY Left    LEG SURGERY Left    MANDIBLE SURGERY     TONSILLECTOMY      Medical History: Past Medical History:  Diagnosis Date   Atrial fibrillation (HCC)    COPD (chronic obstructive pulmonary disease) (HCC)    Dilated cardiomyopathy (HCC)    Heart murmur    HFrEF (heart failure with reduced ejection fraction) (HCC)    Hyperlipidemia     Hypertension    PAD (peripheral artery disease) (HCC)    PVC (premature ventricular contraction)     Family History: Family History  Problem Relation Age of Onset   Lymphoma Father    Bladder Cancer Mother    Prostate cancer Paternal Grandfather    Kidney cancer Neg Hx     Social History   Socioeconomic History   Marital status: Married    Spouse name: Not on file   Number of children: Not on file   Years of education: Not on file   Highest education level: Not on file  Occupational History   Not on file  Tobacco Use   Smoking status: Former    Types: Cigarettes   Smokeless tobacco: Never   Tobacco comments:    quit 70yrs ago  Vaping Use   Vaping Use: Never used  Substance and Sexual Activity   Alcohol use: Never   Drug use: Never   Sexual activity: Not on file  Other Topics Concern   Not on file  Social History Narrative   retired and lives at home with his wife   Social Determinants of Corporate investment banker Strain: Low Risk    Difficulty of Paying Living Expenses: Not very hard  Food Insecurity: No Food Insecurity   Worried About Programme researcher, broadcasting/film/video in the Last Year: Never true   Barista in the Last Year: Never true  Transportation Needs: No Transportation Needs   Lack of Transportation (Medical): No   Lack of Transportation (Non-Medical): No  Physical Activity: Not on file  Stress: Not on file  Social Connections: Not on file  Intimate Partner Violence: Not At Risk   Fear of Current or Ex-Partner: No   Emotionally Abused: No   Physically Abused: No   Sexually Abused: No      Review of Systems  Constitutional:  Negative for chills, fatigue and unexpected weight change.  HENT:  Negative for congestion, rhinorrhea, sneezing and sore throat.   Eyes:  Negative for redness.  Respiratory:  Positive for cough, chest tightness and shortness of breath. Negative for wheezing.   Cardiovascular:  Negative for chest pain and palpitations.   Gastrointestinal:  Negative for abdominal pain, constipation, diarrhea, nausea and vomiting.  Genitourinary:  Negative for dysuria and frequency.  Musculoskeletal:  Negative for arthralgias, back pain, joint swelling and neck pain.  Skin:  Negative for rash.  Neurological: Negative.  Negative for tremors and numbness.  Hematological:  Negative for adenopathy. Does not bruise/bleed easily.  Psychiatric/Behavioral:  Negative for behavioral problems (Depression), sleep disturbance and suicidal ideas. The patient is not nervous/anxious.    Vital Signs: BP 132/64   Pulse 70   Temp 98.1 F (36.7 C)   Resp 16   Ht 6\' 1"  (1.854 m)   Wt 243 lb 9.6 oz (110.5 kg)  SpO2 99%   BMI 32.14 kg/m    Physical Exam Vitals reviewed.  Constitutional:      General: He is not in acute distress.    Appearance: Normal appearance. He is obese. He is not ill-appearing.  HENT:     Head: Normocephalic and atraumatic.  Cardiovascular:     Rate and Rhythm: Normal rate and regular rhythm.     Pulses: Normal pulses.     Heart sounds: Normal heart sounds, S1 normal and S2 normal. No murmur heard. Pulmonary:     Effort: Pulmonary effort is normal. No accessory muscle usage or respiratory distress.     Breath sounds: Normal air entry. Examination of the right-middle field reveals decreased breath sounds. Examination of the left-middle field reveals decreased breath sounds. Examination of the right-lower field reveals decreased breath sounds. Examination of the left-lower field reveals decreased breath sounds. Decreased breath sounds present. No wheezing.  Neurological:     Mental Status: He is alert and oriented to person, place, and time.     Cranial Nerves: No cranial nerve deficit.     Coordination: Coordination normal.     Gait: Gait normal.  Psychiatric:        Mood and Affect: Mood normal.        Behavior: Behavior normal.       Assessment/Plan: 1. Essential hypertension Blood pressure is  stable with current medications, improved from prior office visit.   2. HFrEF (heart failure with reduced ejection fraction) (HCC) Followed by Dr. Mariah Milling, he will follow up with cardiology in 6 months  3. Chronic obstructive pulmonary disease with acute exacerbation (HCC) Possible COPD exacerbation developing w/symptoms of bronchitis. Empiric antibiotic treatment prescribed. If no improvement, will consider chest xray to evaluate for pneumonia. Patient instructed to call clinic, if no improvement in 3-5 days. Otherwise, regular follow up in 6 months.  - amoxicillin-clavulanate (AUGMENTIN) 875-125 MG tablet; Take 1 tablet by mouth 2 (two) times daily.  Dispense: 20 tablet; Refill: 0   General Counseling: jalik gellatly understanding of the findings of todays visit and agrees with plan of treatment. I have discussed any further diagnostic evaluation that may be needed or ordered today. We also reviewed his medications today. he has been encouraged to call the office with any questions or concerns that should arise related to todays visit.    No orders of the defined types were placed in this encounter.   Meds ordered this encounter  Medications   amoxicillin-clavulanate (AUGMENTIN) 875-125 MG tablet    Sig: Take 1 tablet by mouth 2 (two) times daily.    Dispense:  20 tablet    Refill:  0    Return in about 6 months (around 11/15/2021) for F/U, med refill, [place] PCP.   Total time spent:30 Minutes Time spent includes review of chart, medications, test results, and follow up plan with the patient.   [place] Controlled Substance Database was reviewed by me.  This patient was seen by [name], FNP-C in collaboration with Dr. Beverely Risen as a part of collaborative care agreement.   Latoiya Maradiaga R. Tedd Sias, MSN, FNP-C Internal medicine

## 2021-05-20 ENCOUNTER — Telehealth: Payer: Self-pay

## 2021-05-25 ENCOUNTER — Ambulatory Visit: Payer: Self-pay | Admitting: *Deleted

## 2021-06-12 ENCOUNTER — Other Ambulatory Visit: Payer: Self-pay | Admitting: Cardiovascular Disease

## 2021-06-15 ENCOUNTER — Encounter: Payer: Self-pay | Admitting: *Deleted

## 2021-06-15 ENCOUNTER — Other Ambulatory Visit: Payer: Self-pay

## 2021-06-15 ENCOUNTER — Other Ambulatory Visit: Payer: Self-pay | Admitting: *Deleted

## 2021-06-15 NOTE — Patient Outreach (Addendum)
[facility]) Care Management Telephonic RN Care Manager Note   06/15/2021 Name:  Casey Arias MRN:  [id number] DOB:  1945/10/15  Summary: He reports managing well Denies any breathing/COPD nor Afib worsening symptoms Caution provided for inclement weather  Reviewed his LDL results and action plans to reduce to include use of increase of activity/muscle mass, protein, taking statin as ordered discussed. He voiced understanding  Recommendations/Changes made from today's visit: Recommend to schedule appointment with Dr Mariah Milling Cardiology as one not seen for 2023's 6 month follow (April 2023)  Reviewed [facility] progression- Pt preference to remain monthly outreach as he reports he is motivated during [employer]  Subjective: Casey Arias is an 75 y.o. year old male who is a primary patient of [name], NP. The care management team was consulted for assistance with care management and/or care coordination needs.    Telephonic RN Care Manager completed Telephone Visit today.   Objective:  Medications Reviewed Today     Reviewed by Clinton Gallant, RN (Registered Nurse) on 06/15/21 at 1103  Med List Status: <None>   Medication Order Taking? Sig Documenting Provider Last Dose Status Informant  albuterol (VENTOLIN HFA) 108 (90 Base) MCG/ACT inhaler [id number] No Inhale 2 puffs every 4 to 6 hrs as needed for sob Johnna Acosta, NP Taking Active   amoxicillin-clavulanate (AUGMENTIN) 875-125 MG tablet [id number]  Take 1 tablet by mouth 2 (two) times daily. [name], NP  Active   atorvastatin (LIPITOR) 40 MG tablet [id number]  Take 1 tablet (40 mg total) by mouth daily. Antonieta Iba, MD  Active   chlorhexidine (PERIDEX) 0.12 % solution [id number] No Use as directed 15 mLs in the mouth or throat 2 (two) times daily. Theotis Burrow, NP Taking Active   diclofenac Sodium (VOLTAREN) 1 % GEL [id number] No Apply 2 g topically daily. Theotis Burrow, NP  Taking Active   diltiazem (CARDIZEM) 30 MG tablet [id number]  TAKE 1 TABLET (30 MG TOTAL) BY MOUTH 3 (THREE) TIMES DAILY AS NEEDED. Antonieta Iba, MD  Active   EPINEPHrine (EPIPEN 2-PAK) 0.3 mg/0.3 mL IJ SOAJ injection [id number] No Use as directed  as needed for anaphylaxis Carlean Jews, NP Taking Active Self  ezetimibe (ZETIA) 10 MG tablet [id number]  Take 1 tablet (10 mg total) by mouth daily. Antonieta Iba, MD  Active   furosemide (LASIX) 40 MG tablet [id number] No Take 1 tablet (40 mg total) by mouth daily as needed. Take with potassium as needed for 3 pounds weight gain Antonieta Iba, MD Taking Expired 05/22/21 2359   metoprolol succinate (TOPROL-XL) 25 MG 24 hr tablet [id number]  Take 3 tablets (75 mg total) by mouth daily. Take 4 tablets (100 mg) once daily with supper for hearts > 110, take 2 tablets (50 mg) for heart rates <110 Gollan, Tollie Pizza, MD  Active   Multiple Vitamins-Minerals (MULTIVITAMIN WITH MINERALS) tablet [id number] No Take 1 tablet by mouth daily. [provider] Taking Active Self  potassium chloride SA (KLOR-CON) 20 MEQ tablet [id number] No Take 1 tablet (20 mEq total) by mouth daily. Antonieta Iba, MD Taking Active   rivaroxaban (XARELTO) 20 MG TABS tablet [id number] No Take 1 tablet (20 mg total) by mouth daily with supper. Antonieta Iba, MD Taking Active   triamcinolone cream (KENALOG) 0.1 % [id number] No Apply 1 application topically 2 (two) times daily as needed (irritation). Apply to affected area twice a day as needed [name], NP  Taking Active              SDOH:  (Social Determinants of Health) assessments and interventions performed:  SDOH Interventions    Flowsheet Row Most Recent Value  SDOH Interventions   Food Insecurity Interventions Intervention Not Indicated  Financial Strain Interventions Intervention Not Indicated  Housing Interventions Intervention Not Indicated  Intimate Partner Violence Interventions  Intervention Not Indicated  Physical Activity Interventions Other (Comments)  [pt to increase activity in 2023 to assist with LDL management]  Stress Interventions Intervention Not Indicated  Social Connections Interventions Intervention Not Indicated  Transportation Interventions Intervention Not Indicated       Care Plan  Review of patient past medical history, allergies, medications, health status, including review of consultants reports, laboratory and other test data, was performed as part of comprehensive evaluation for care management services.   Care Plan : RN Care Manager Plan of Care  Updates made by Clinton Gallant, RN since 06/15/2021 12:00 AM     Problem: Complex Care Coordination Needs and disease management in patient with COPD and Afib   Priority: High     Long-Range Goal: Establish Plan of Care for Management Complex SDOH Barriers, disease management and Care Coordination Needs in patient with COPD, Afib   Start Date: 05/16/2021  This Visit's Progress: On track  Recent Progress: On track  Priority: High  Note:   Current Barriers:  Knowledge Deficits related to plan of care for management of Atrial Fibrillation, HLD, and COPD  Care Coordination needs related to Limited education about COPD, Afib,* Health behaviors  RN CM Clinical Goal(s):  Patient will verbalize understanding of plan for management of Atrial Fibrillation and COPD through collaboration with RN Care manager, provider, and care team.   Interventions: Follow as agreed for care coordination and disease management services Inter-disciplinary care team collaboration (see longitudinal plan of care) Evaluation of current treatment plan related to  self management and patient's adherence to plan as established by provider   COPD Interventions:  status ongoing on track  Advised patient to track and manage COPD triggers Advised patient to engage in light exercise as tolerated 3-5 days a week to aid in  the the management of COPD Assessed for any worsening symptoms of COPD  AFIB Interventions:  Status  on track   Reviewed importance of adherence to anticoagulant exactly as prescribed; Afib action plan reviewed; Reviewed his home management interventions and ability to identify when he his having Afib  He reports he notes the fluttering in his chest and before he was diagnosed and these symptoms had occurred he would drink cold orange crush sodas which would successfully stop the fluttering    Hyperlipidemia Interventions:  (Status:  New goal.) Short Term Goal Medication review performed; medication list updated in electronic medical record.  Provider established cholesterol goals reviewed Counseled on importance of regular laboratory monitoring as prescribed Reviewed role and benefits of statin for ASCVD risk reduction Reviewed importance of limiting foods high in cholesterol Reviewed exercise goals and target of 150 minutes per week Reviewed foods that make LDL elevated to include red meats, whole fat dairy products, baked goods, fast foods, chicken with skin on, cured or processed meats, partially hydrogenated vegetable oils Caution provided for inclement weather Reviewed his LDL results and action plans to reduce to include use of increase of activity/muscle mass, protein, taking statin as ordered discussed  Patient Goals/Self-Care Activities: pt to increase activity in 2023 to assist with LDL management  Take statin  as ordered and continue to monitor diet for foods that elevate LDL  Follow Up Plan:  The patient has been provided with contact information for the care management team and has been advised to call with any health related questions or concerns.  The care management team will reach out to the patient again over the next 30 business  days.       Plan: The patient has been provided with contact information for the care management team and has been advised to call with any  health related questions or concerns.  The care management team will reach out to the patient again over the next 30 business days.  Aniaya Bacha L. Noelle Penner, RN, BSN, CCM [facility] Telephonic Care Management Care Coordinator Office number [phone] Main [facility] number [phone] Fax number [phone]

## 2021-06-16 ENCOUNTER — Telehealth: Payer: Self-pay | Admitting: *Deleted

## 2021-06-16 NOTE — Patient Outreach (Signed)
[facility]) Care Management  06/16/2021  Casey Arias [facility] 1945/09/12 [id number]   Error in opening encounter    [name], RN, BSN, CCM [facility] Telephonic Care Management Care Coordinator Office number [phone] Main [facility] number [phone] Fax number [phone]

## 2021-07-19 ENCOUNTER — Other Ambulatory Visit: Payer: Self-pay

## 2021-07-19 ENCOUNTER — Other Ambulatory Visit: Payer: Self-pay | Admitting: *Deleted

## 2021-07-19 NOTE — Patient Outreach (Signed)
[facility]) Care Management Telephonic RN Care Manager Note   08/04/2021 Name:  Casey Arias MRN:  [id number] DOB:  Jan 11, 1946  Summary: Follow up outreach   Hypertension (HTN) had some elevations reports related to specific food intake  He is wearing compression hose to assist  On 07/02/21 home vital 152/84 87@ 1700 , 149/75 53 @ 1745,  142/69 51 @ 1810 Heart rates 56-59  Weight 237 lbs    Atrial fibrillation questions  Now still going out to grocery store independently Worry about food supply at times   Recommendations/Changes made from today's visit: Monitoring for edema Reviewed types of atrial fibrillation and treatments Questions answered about atrial fibrillation He was commended on his home interventions/management- encouragement provided  Allowed him time to ventilate his feelings   Subjective: Casey Arias is an 76 y.o. year old male who is a primary patient of Jonetta Osgood, NP. The care management team was consulted for assistance with care management and/or care coordination needs.    Telephonic RN Care Manager completed Telephone Visit today.   Objective:  Medications Reviewed Today     Reviewed by Barbaraann Faster, RN (Registered Nurse) on 07/19/21 at 48  Med List Status: <None>   Medication Order Taking? Sig Documenting Provider Last Dose Status Informant  albuterol (VENTOLIN HFA) 108 (90 Base) MCG/ACT inhaler [id number] No Inhale 2 puffs every 4 to 6 hrs as needed for sob Kendell Bane, NP Taking Active   amoxicillin-clavulanate (AUGMENTIN) 875-125 MG tablet [id number]  Take 1 tablet by mouth 2 (two) times daily. Jonetta Osgood, NP  Active   atorvastatin (LIPITOR) 40 MG tablet [id number]  Take 1 tablet (40 mg total) by mouth daily. Minna Merritts, MD  Expired 07/14/21 2359   chlorhexidine (PERIDEX) 0.12 % solution [id number] No Use as directed 15 mLs in the mouth or throat 2 (two) times daily. Luiz Ochoa,  NP Taking Active   diclofenac Sodium (VOLTAREN) 1 % GEL [id number] No Apply 2 g topically daily. Luiz Ochoa, NP Taking Active   diltiazem (CARDIZEM) 30 MG tablet [id number]  TAKE 1 TABLET (30 MG TOTAL) BY MOUTH 3 (THREE) TIMES DAILY AS NEEDED. Minna Merritts, MD  Active   EPINEPHrine (EPIPEN 2-PAK) 0.3 mg/0.3 mL IJ SOAJ injection [id number] No Use as directed  as needed for anaphylaxis Ronnell Freshwater, NP Taking Active Self  ezetimibe (ZETIA) 10 MG tablet [id number]  Take 1 tablet (10 mg total) by mouth daily. Minna Merritts, MD  Expired 07/14/21 2359   furosemide (LASIX) 40 MG tablet [id number] No Take 1 tablet (40 mg total) by mouth daily as needed. Take with potassium as needed for 3 pounds weight gain Minna Merritts, MD Taking Expired 05/22/21 2359   metoprolol succinate (TOPROL-XL) 25 MG 24 hr tablet [id number]  Take 3 tablets (75 mg total) by mouth daily. Take 4 tablets (100 mg) once daily with supper for hearts > 110, take 2 tablets (50 mg) for heart rates <110 Gollan, Kathlene November, MD  Active   Multiple Vitamins-Minerals (MULTIVITAMIN WITH MINERALS) tablet [id number] No Take 1 tablet by mouth daily. [provider] Taking Active Self  potassium chloride SA (KLOR-CON) 20 MEQ tablet [id number] No Take 1 tablet (20 mEq total) by mouth daily. Minna Merritts, MD Taking Active   rivaroxaban (XARELTO) 20 MG TABS tablet [id number] No Take 1 tablet (20 mg total) by mouth daily with supper. Minna Merritts, MD Taking Active   triamcinolone cream (  KENALOG) 0.1 % [id number] No Apply 1 application topically 2 (two) times daily as needed (irritation). Apply to affected area twice a day as needed Jonetta Osgood, NP Taking Active            Patient Active Problem List   Diagnosis Date Noted   COPD (chronic obstructive pulmonary disease) ([place]) 03/15/2021   Urinary tract infection with hematuria 02/13/2020   BPH with obstruction/lower urinary tract symptoms 01/05/2020   Dental  abscess 05/01/2019   Mild intermittent asthma without complication [id number]   Atopic dermatitis 05/08/2018   Benign prostatic hyperplasia without lower urinary tract symptoms 05/08/2018   Encounter for general adult medical examination with abnormal findings 10/26/2017   Allergy to bee sting 10/26/2017   Need for vaccination against Streptococcus pneumoniae using pneumococcal conjugate vaccine 13 10/26/2017   Dysuria 10/26/2017   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   Palpitations 11/12/2015   Shortness of breath 11/12/2015     SDOH:  (Social Determinants of Health) assessments and interventions performed:    Care Plan  Review of patient past medical history, allergies, medications, health status, including review of consultants reports, laboratory and other test data, was performed as part of comprehensive evaluation for care management services.   Care Plan : COPD (Adult)  Updates made by Barbaraann Faster, RN since 08/04/2021 12:00 AM  Completed 08/04/2021   Care Plan : RN Care Manager Plan of Care  Updates made by Barbaraann Faster, RN since 08/04/2021 12:00 AM     Problem: Complex Care Coordination Needs and disease management in patient with COPD and Afib   Priority: High     Long-Range Goal: Establish Plan of Care for Management Complex SDOH Barriers, disease management and Care Coordination Needs in patient with COPD, Afib   Start Date: 05/16/2021  This Visit's Progress: On track  Recent Progress: On track  Priority: High  Note:   Current Barriers:  Knowledge Deficits related to plan of care for management of Atrial Fibrillation, HLD, and COPD  Care Coordination needs related to Limited education about COPD, Afib,* Barriers: Limited visits out of the home, knowledge, Health behaviors  RN CM Clinical Goal(s):  Patient will verbalize understanding of plan for management of Atrial Fibrillation and COPD through collaboration with RN Care manager, provider, and care team.    Interventions: Follow as agreed for care coordination and disease management services Inter-disciplinary care team collaboration (see longitudinal plan of care) Evaluation of current treatment plan related to  self management and patient's adherence to plan as established by provider   COPD Interventions:  status ongoing on track  Advised patient to track and manage COPD triggers Advised patient to engage in light exercise as tolerated 3-5 days a week to aid in the the management of COPD Assessed for any worsening symptoms of COPD  AFIB Interventions:  Status  on track 07/19/21 he continues to ask questions about Afib related to his s/s- questions answered    Counseled on increased risk of stroke due to Afib and benefits of anticoagulation for stroke prevention Reviewed importance of adherence to anticoagulant exactly as prescribed Counseled on importance of regular laboratory monitoring as prescribed Afib action plan reviewed Screening for signs and symptoms of depression related to chronic disease state  Assessed social determinant of health barriers Reviewed his home management interventions and ability to identify when he his having Afib  He reports he notes the fluttering in his chest and before he was diagnosed and these symptoms had occurred he would drink  cold orange crush sodas which would successfully stop the fluttering  07/19/21 Reviewed types of atrial fibrillation and treatments Questions answered about atrial fibrillation He was commended on his home interventions/management- encouragement provided    Hyperlipidemia Interventions:  (Status:  Goal on track:  Yes.) Short Term Goal Medication review performed; medication list updated in electronic medical record.  Provider established cholesterol goals reviewed Counseled on importance of regular laboratory monitoring as prescribed Reviewed role and benefits of statin for ASCVD risk reduction Reviewed importance of limiting  foods high in cholesterol Reviewed exercise goals and target of 150 minutes per week Reviewed foods that make LDL elevated to include red meats, whole fat dairy products, baked goods, fast foods, chicken with skin on, cured or processed meats, partially hydrogenated vegetable oils Caution provided for inclement weather Reviewed his LDL results and action plans to reduce to include use of increase of activity/muscle mass, protein, taking statin as ordered discussed  Patient Goals/Self-Care Activities: pt to increase activity in 2023 to assist with LDL management  Take statin as ordered and continue to monitor diet for foods that elevate LDL Monitor vital signs, weight at home  Wear compression hose   Follow Up Plan:  The patient has been provided with contact information for the care management team and has been advised to call with any health related questions or concerns.  The care management team will reach out to the patient again over the next 30 + business  days.       Plan: The patient has been provided with contact information for the care management team and has been advised to call with any health related questions or concerns.  The care management team will reach out to the patient again over the next 30+ business days.  Joycelynn Fritsche L. Lavina Hamman, RN, BSN, [place] Coordinator Office number [phone] [facility] number [phone] Fax number [phone]

## 2021-08-12 ENCOUNTER — Other Ambulatory Visit: Payer: Self-pay

## 2021-08-12 ENCOUNTER — Other Ambulatory Visit: Payer: Self-pay | Admitting: Cardiovascular Disease

## 2021-08-12 DIAGNOSIS — Z7901 Long term (current) use of anticoagulants: Secondary | ICD-10-CM

## 2021-08-12 NOTE — Telephone Encounter (Signed)
Scheduled

## 2021-08-12 NOTE — Telephone Encounter (Signed)
Prescription refill request for Xarelto received.  Indication: Atrial fib Last office visit: 04/11/21  Concha Se MD Weight: 109kg Age: 77 Scr: 0.68 on 04/15/20 CrCl: 149.16  Cased on above findings Xarelto 20mg  daily is the appropriate dose.  Patient is past due for CBC/BMP.  Message sent to A Fox RN to order labs.    Refill approved x 1.

## 2021-08-17 ENCOUNTER — Other Ambulatory Visit: Payer: Medicare HMO

## 2021-08-19 ENCOUNTER — Other Ambulatory Visit: Payer: Self-pay | Admitting: *Deleted

## 2021-08-19 NOTE — Patient Outreach (Addendum)
[facility]) Care Management  08/19/2021  Casey Arias Nov 08, 1945 [id number]   [facility] Unsuccessful outreach   Outreach attempt to the listed at the preferred outreach number in EPIC at [phone] No answer. [facility] RN CM left HIPAA [facility] Portability and Accountability Act) compliant voicemail message along with CMs contact info.   Plan: [facility] RN CM scheduled this patient for another call attempt within 30+ business days  Unsuccessful outreach on 08/19/21    [name], RN, BSN, CCM [facility] Telephonic Care Management Care Coordinator Office number [phone]

## 2021-08-22 ENCOUNTER — Telehealth: Payer: Self-pay | Admitting: Student-PharmD

## 2021-08-22 NOTE — Progress Notes (Signed)
General Review Call   Casey Arias, Casey Arias [id number] 67 years, Male  DOB: [birth year]  M: [id number]  [facility]) Chart Review Completed by Casey Arias on 08/19/2021 12:20 PM  What recent interventions/DTPs have been made by any provider to improve the patient's conditions in the last 3 months?:  Office Visit: 03/25/21 Casey Munro A. For COPD. No other information given. 05/18/21 Casey Kuster, NP. For Essential hypertension. STARTED Augmentin 875-125 mg 1 tablet 2 times daily. STOPPED Augmentin, Losartan, and Naproxen.  Consults: 04/11/21 Cardiology Gollan, Tollie Pizza, MD For Dilated cardiomyopathy. CHANGED Atorvastatin Calcium 20 mg daily.  Any recent hospitalizations or ED visits since last visit with CPP?: No  Adherence Review Adherence rates for STAR metric medications:  Atorvastatin 40 mg 07/12/21 90 DS  Adherence rates for medications indicated for disease state being reviewed: Atorvastatin 40 mg 07/12/21 90 DS  Does the patient have >5 day gap between last estimated fill dates for any of the above medications?: No  Disease State Questions Able to connect with the Patient?: No  Misc. Response/Information:: [facility] 08/19/21 & [id number] 08/22/21  Engagement Notes Casey Arias on 08/19/2021 12:22 PM Sch appointment for 09/28/21 at 11:15 AM  Casey Arias on 08/19/2021 12:22 PM [facility] Chart Review: 15 min 08/19/21 [facility] Assessment call time spent: CPP Office Visit Documentation: [facility] Care Plan Completion: CPP Coordination of Care: CPP Care Plan Review:  Casey Arias, RMA Healthcare Concierge  [phone]

## 2021-08-23 ENCOUNTER — Other Ambulatory Visit (INDEPENDENT_AMBULATORY_CARE_PROVIDER_SITE_OTHER): Payer: Medicare HMO

## 2021-08-23 ENCOUNTER — Other Ambulatory Visit: Payer: Self-pay

## 2021-08-23 DIAGNOSIS — Z79899 Other long term (current) drug therapy: Secondary | ICD-10-CM | POA: Diagnosis not present

## 2021-08-23 DIAGNOSIS — E7800 Pure hypercholesterolemia, unspecified: Secondary | ICD-10-CM | POA: Diagnosis not present

## 2021-08-23 DIAGNOSIS — Z7901 Long term (current) use of anticoagulants: Secondary | ICD-10-CM | POA: Diagnosis not present

## 2021-08-24 LAB — BASIC METABOLIC PANEL
BUN/Creatinine Ratio: 13 (ref 10–24)
BUN: 9 mg/dL (ref 8–27)
CO2: 22 mmol/L (ref 20–29)
Calcium: 9.7 mg/dL (ref 8.6–10.2)
Chloride: 98 mmol/L (ref 96–106)
Creatinine, Ser: 0.71 mg/dL — ABNORMAL LOW (ref 0.76–1.27)
Glucose: 103 mg/dL — ABNORMAL HIGH (ref 70–99)
Potassium: 4 mmol/L (ref 3.5–5.2)
Sodium: 132 mmol/L — ABNORMAL LOW (ref 134–144)
eGFR: 97 mL/min/{1.73_m2} (ref >59)

## 2021-08-24 LAB — LIPID PANEL
Chol/HDL Ratio: 2.6 ratio (ref 0.0–5.0)
Cholesterol, Total: 117 mg/dL (ref 100–199)
HDL: 45 mg/dL (ref >39)
LDL Chol Calc (NIH): 59 mg/dL (ref 0–99)
Triglycerides: 57 mg/dL (ref 0–149)
VLDL Cholesterol Cal: 13 mg/dL (ref 5–40)

## 2021-08-24 LAB — CBC
Hematocrit: 45.2 % (ref 37.5–51.0)
Hemoglobin: 15.4 g/dL (ref 13.0–17.7)
MCH: 29.2 pg (ref 26.6–33.0)
MCHC: 34.1 g/dL (ref 31.5–35.7)
MCV: 86 fL (ref 79–97)
Platelets: 317 10*3/uL (ref 150–450)
RBC: 5.28 x10E6/uL (ref 4.14–5.80)
RDW: 12.2 % (ref 11.6–15.4)
WBC: 12.8 10*3/uL — ABNORMAL HIGH (ref 3.4–10.8)

## 2021-08-26 ENCOUNTER — Other Ambulatory Visit: Payer: Self-pay | Admitting: *Deleted

## 2021-08-26 ENCOUNTER — Other Ambulatory Visit: Payer: Self-pay

## 2021-08-26 ENCOUNTER — Encounter: Payer: Self-pay | Admitting: *Deleted
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
## 2021-08-26 NOTE — Patient Outreach (Signed)
[facility]) Care Management ?Telephonic RN Care Manager Note ? ? ?08/26/2021 ?Name:  Kalieb Freeland MRN:  [id number] DOB:  Jul 06, 1945 ? ?[facility] Unsuccessful outreach ? ? ?Outreach attempt to the listed at the preferred outreach number in EPIC [phone] ?No answer. [facility] RN CM left HIPAA [facility] Portability and Accountability Act) compliant voicemail message along with CM?s contact info.  ? ? ?Objective: ? ?Medications Reviewed Today   ? ? Reviewed by Clinton Gallant, RN (Registered Nurse) on 07/19/21 at 1129  Med List Status: <None>  ? ?Medication Order Taking? Sig Documenting Provider Last Dose Status Informant  ?albuterol (VENTOLIN HFA) 108 (90 Base) MCG/ACT inhaler [id number] No Inhale 2 puffs every 4 to 6 hrs as needed for sob Johnna Acosta, NP Taking Active   ?amoxicillin-clavulanate (AUGMENTIN) 875-125 MG tablet [id number]  Take 1 tablet by mouth 2 (two) times daily. [name], NP  Active   ?atorvastatin (LIPITOR) 40 MG tablet [id number]  Take 1 tablet (40 mg total) by mouth daily. Antonieta Iba, MD  Expired 07/14/21 2359   ?chlorhexidine (PERIDEX) 0.12 % solution [id number] No Use as directed 15 mLs in the mouth or throat 2 (two) times daily. Theotis Burrow, NP Taking Active   ?diclofenac Sodium (VOLTAREN) 1 % GEL [id number] No Apply 2 g topically daily. Theotis Burrow, NP Taking Active   ?diltiazem (CARDIZEM) 30 MG tablet [id number]  TAKE 1 TABLET (30 MG TOTAL) BY MOUTH 3 (THREE) TIMES DAILY AS NEEDED. Antonieta Iba, MD  Active   ?EPINEPHrine (EPIPEN 2-PAK) 0.3 mg/0.3 mL IJ SOAJ injection [id number] No Use as directed  as needed for anaphylaxis Carlean Jews, NP Taking Active Self  ?ezetimibe (ZETIA) 10 MG tablet [id number]  Take 1 tablet (10 mg total) by mouth daily. Antonieta Iba, MD  Expired 07/14/21 2359   ?furosemide (LASIX) 40 MG tablet [id number] No Take 1 tablet (40 mg total) by mouth daily as needed. Take with potassium as needed for 3 pounds  weight gain Antonieta Iba, MD Taking Expired 05/22/21 2359   ?metoprolol succinate (TOPROL-XL) 25 MG 24 hr tablet [id number]  Take 3 tablets (75 mg total) by mouth daily. Take 4 tablets (100 mg) once daily with supper for hearts > 110, take 2 tablets (50 mg) for heart rates <110 Gollan, Tollie Pizza, MD  Active   ?Multiple Vitamins-Minerals (MULTIVITAMIN WITH MINERALS) tablet [id number] No Take 1 tablet by mouth daily. [provider] Taking Active Self  ?potassium chloride SA (KLOR-CON) 20 MEQ tablet [id number] No Take 1 tablet (20 mEq total) by mouth daily. Antonieta Iba, MD Taking Active   ?rivaroxaban (XARELTO) 20 MG TABS tablet [id number] No Take 1 tablet (20 mg total) by mouth daily with supper. Antonieta Iba, MD Taking Active   ?triamcinolone cream (KENALOG) 0.1 % [id number] No Apply 1 application topically 2 (two) times daily as needed (irritation). Apply to affected area twice a day as needed [name], NP Taking Active   ? ?  ?  ? ?  ? ? ? ?SDOH:  (Social Determinants of Health) assessments and interventions performed:  ? ? ?Care Plan ? ?Review of patient past medical history, allergies, medications, health status, including review of consultants reports, laboratory and other test data, was performed as part of comprehensive evaluation for care management services.  ? ?There are no care plans that you recently modified to display for this patient. ?  ?Plan: ?[facility] RN CM scheduled this patient for  another call attempt within 4-7 business days ?Unsuccessful outreach on 08/19/21, 08/26/21 ? ? ?Nickalus Thornsberry L. Noelle Penner, RN, BSN, CCM ?[facility] Telephonic Care Management Care Coordinator ?Office number [phone] ? ? ? ?

## 2021-08-29 ENCOUNTER — Telehealth: Payer: Self-pay

## 2021-08-29 NOTE — Telephone Encounter (Signed)
Able to reach pt regarding his recent lab work, Dr. Mariah Milling had a chance to review their results and advised  ? ?"Lab work reviewed  ?Sodium little bit low  ?Cholesterol excellent  ?Nonspecific minimally elevated white blood cell count " ? ?All questions or concerns were address and no additional concerns at this time, pt thankful for calling, will call back for anything further.   ? ?

## 2021-09-13 ENCOUNTER — Other Ambulatory Visit: Payer: Self-pay | Admitting: Cardiovascular Disease

## 2021-09-13 NOTE — Telephone Encounter (Signed)
Pt last saw Dr Mariah Milling 04/11/21, last labs 08/23/21 Creat 0.71, age 76, weight 107.9kg, CrCl 141.42, based on CrCl pt is on appropriate dosage of Xarelto 20mg  QD for afib.  Will refill rx.  ?

## 2021-09-13 NOTE — Telephone Encounter (Signed)
Please review

## 2021-09-28 ENCOUNTER — Telehealth: Payer: Medicare HMO
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
## 2021-10-05 ENCOUNTER — Telehealth: Payer: Medicare HMO

## 2021-10-10 ENCOUNTER — Encounter: Payer: Self-pay | Admitting: Cardiovascular Disease

## 2021-10-10 ENCOUNTER — Ambulatory Visit: Payer: Medicare HMO | Admitting: Cardiovascular Disease

## 2021-10-10 VITALS — BP 130/86 | HR 55 | Ht 73.0 in | Wt 230.4 lb

## 2021-10-10 DIAGNOSIS — R0602 Shortness of breath: Secondary | ICD-10-CM

## 2021-10-10 DIAGNOSIS — I6521 Occlusion and stenosis of right carotid artery: Secondary | ICD-10-CM

## 2021-10-10 DIAGNOSIS — I739 Peripheral vascular disease, unspecified: Secondary | ICD-10-CM

## 2021-10-10 DIAGNOSIS — E782 Mixed hyperlipidemia: Secondary | ICD-10-CM

## 2021-10-10 DIAGNOSIS — J432 Centrilobular emphysema: Secondary | ICD-10-CM

## 2021-10-10 DIAGNOSIS — I420 Dilated cardiomyopathy: Secondary | ICD-10-CM | POA: Diagnosis not present

## 2021-10-10 DIAGNOSIS — I493 Ventricular premature depolarization: Secondary | ICD-10-CM | POA: Diagnosis not present

## 2021-10-10 MED ORDER — LOSARTAN POTASSIUM 25 MG PO TABS: 25.0000 mg | ORAL_TABLET | Freq: Every day | ORAL | 3 refills | Status: DC

## 2021-10-10 NOTE — Patient Instructions (Addendum)
Medication Instructions: ?- Your physician has recommended you make the following change in your medication:  ? ?1) START losartan 25 mg: ?- take 1 tablet by mouth once daily ? ?If you need a refill on your cardiac medications before your next appointment, please call your pharmacy.  ? ? ?Lab work: ?No new labs needed ? ? ?Testing/Procedures: ?- Your physician has requested that you have a carotid duplex. This test is an ultrasound of the carotid arteries in your neck. It looks at blood flow through these arteries that supply the brain with blood. Allow one hour for this exam. There are no restrictions or special instructions.  ? ? ?Follow-Up: ?At [facility], you and your health needs are our priority.  As part of our continuing mission to provide you with exceptional heart care, we have created designated Provider Care Teams.  These Care Teams include your primary Cardiologist (physician) and Advanced Practice Providers (APPs -  Physician Assistants and Nurse Practitioners) who all work together to provide you with the care you need, when you need it. ? ?You will need a follow up appointment in 12 months ? ?Providers on your designated Care Team:   ?Nicolasa Ducking, NP ?Eula Listen, PA-C ?Cadence Fransico Quay, PA-C ? ?COVID-19 Vaccine Information can be found at: [email or web address] For questions related to vaccine distribution or appointments, please email [email or web address] or call [phone].  ? ? ?Losartan Tablets ?What is this medication? ?LOSARTAN (loe SAR tan) treats high blood pressure. It may also be used to prevent a stroke in people with heart disease and high blood pressure. It can be used to prevent kidney damage in people with diabetes. It works by relaxing the blood vessels, which helps decrease the amount of work your heart has to do. It belongs to a group of medications called ARBs. ?This medicine may be used for other purposes; ask your  health care provider or pharmacist if you have questions. ?COMMON BRAND NAME(S): Cozaar ?What should I tell my care team before I take this medication? ?They need to know if you have any of these conditions: ?Heart failure ?Kidney disease ?Liver disease ?An unusual or allergic reaction to losartan, other medications, foods, dyes, or preservatives ?Pregnant or trying to get pregnant ?Breast-feeding ?How should I use this medication? ?Take this medication by mouth. Take it as directed on the prescription label at the same time every day. You can take it with or without food. If it upsets your stomach, take it with food. Keep taking it unless your care team tells you to stop. ?Talk to your care team about the use of this medication in children. While it may be prescribed for children as young as 6 for selected conditions, precautions do apply. ?Overdosage: If you think you have taken too much of this medicine contact a poison control center or emergency room at once. ?NOTE: This medicine is only for you. Do not share this medicine with others. ?What if I miss a dose? ?If you miss a dose, take it as soon as you can. If it is almost time for your next dose, take only that dose. Do not take double or extra doses. ?What may interact with this medication? ?Aliskiren ?ACE inhibitors, like enalapril or lisinopril ?Diuretics, especially amiloride, eplerenone, spironolactone, or triamterene ?Lithium ?NSAIDs, medications for pain and inflammation, like ibuprofen or naproxen ?Potassium salts or potassium supplements ?This list may not describe all possible interactions. Give your health care provider a list of all the medicines, herbs, non-prescription drugs, or dietary  supplements you use. Also tell them if you smoke, drink alcohol, or use illegal drugs. Some items may interact with your medicine. ?What should I watch for while using this medication? ?Visit your care team for regular check ups. Check your blood pressure as  directed. Ask your care team what your blood pressure should be. Also, find out when you should contact them. ?Do not treat yourself for coughs, colds, or pain while you are using this medication without asking your care team for advice. Some medications may increase your blood pressure. ?Women should inform their care team if they wish to become pregnant or think they might be pregnant. There is a potential for serious side effects to an unborn child. Talk to your care team for more information. ?You may get drowsy or dizzy. Do not drive, use machinery, or do anything that needs mental alertness until you know how this medication affects you. Do not stand or sit up quickly, especially if you are an older patient. This reduces the risk of dizzy or fainting spells. Alcohol can make you more drowsy and dizzy. Avoid alcoholic drinks. ?Avoid salt substitutes unless you are told otherwise by your care team. ?What side effects may I notice from receiving this medication? ?Side effects that you should report to your care team as soon as possible: ?Allergic reactions--skin rash, itching, hives, swelling of the face, lips, tongue, or throat ?High potassium level--muscle weakness, fast or irregular heartbeat ?Kidney injury--decrease in the amount of urine, swelling of the ankles, hands, or feet ?Low blood pressure--dizziness, feeling faint or lightheaded, blurry vision ?Side effects that usually do not require medical attention (report to your care team if they continue or are bothersome): ?Dizziness ?Headache ?Runny or stuffy nose ?This list may not describe all possible side effects. Call your doctor for medical advice about side effects. You may report side effects to FDA at 1-800-FDA-1088. ?Where should I keep my medication? ?Keep out of the reach of children and pets. ?Store at room temperature between 20 and 25 degrees C (68 and 77 degrees F). Protect from light. Keep the container tightly closed. Get rid of any unused  medication after the expiration date. ?To get rid of medications that are no longer needed or have expired: ?Take the medication to a medication take-back program. Check with your pharmacy or law enforcement to find a location. ?If you cannot return the medication, check the label or package insert to see if the medication should be thrown out in the garbage or flushed down the toilet. If you are not sure, ask your care team. If it is safe to put in the trash, empty the medication out of the container. Mix the medication with cat litter, dirt, coffee grounds, or other unwanted substance. Seal the mixture in a bag or container. Put it in the trash. ?NOTE: This sheet is a summary. It may not cover all possible information. If you have questions about this medicine, talk to your doctor, pharmacist, or health care provider. ?? 2023 Elsevier/Gold Standard (2021-05-13 00:00:00) ? ?

## 2021-10-11 ENCOUNTER — Other Ambulatory Visit: Payer: Self-pay | Admitting: *Deleted
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
## 2021-11-16 ENCOUNTER — Ambulatory Visit (INDEPENDENT_AMBULATORY_CARE_PROVIDER_SITE_OTHER): Payer: Medicare HMO | Admitting: Nurse Practitioner

## 2021-11-16 ENCOUNTER — Encounter: Payer: Self-pay | Admitting: Nurse Practitioner

## 2021-11-16 VITALS — BP 140/63 | HR 60 | Temp 98.3°F | Resp 16 | Ht 73.0 in | Wt 237.2 lb

## 2021-11-16 DIAGNOSIS — Z91030 Bee allergy status: Secondary | ICD-10-CM

## 2021-11-16 DIAGNOSIS — R7301 Impaired fasting glucose: Secondary | ICD-10-CM

## 2021-11-16 DIAGNOSIS — R7303 Prediabetes: Secondary | ICD-10-CM | POA: Diagnosis not present

## 2021-11-16 DIAGNOSIS — I6521 Occlusion and stenosis of right carotid artery: Secondary | ICD-10-CM | POA: Diagnosis not present

## 2021-11-16 DIAGNOSIS — S02670S Fracture of alveolus of mandible, unspecified side, sequela: Secondary | ICD-10-CM

## 2021-11-16 DIAGNOSIS — J441 Chronic obstructive pulmonary disease with (acute) exacerbation: Secondary | ICD-10-CM

## 2021-11-16 DIAGNOSIS — I5020 Unspecified systolic (congestive) heart failure: Secondary | ICD-10-CM

## 2021-11-16 DIAGNOSIS — L209 Atopic dermatitis, unspecified: Secondary | ICD-10-CM

## 2021-11-16 DIAGNOSIS — I10 Essential (primary) hypertension: Secondary | ICD-10-CM

## 2021-11-16 LAB — POCT GLYCOSYLATED HEMOGLOBIN (HGB A1C): Hemoglobin A1C: 5.7 % — AB (ref 4.0–5.6)

## 2021-11-16 MED ORDER — CHLORHEXIDINE GLUCONATE 0.12 % MT SOLN: 15.0000 mL | Freq: Two times a day (BID) | OROMUCOSAL | 1 refills | Status: DC

## 2021-11-16 MED ORDER — EPINEPHRINE 0.3 MG/0.3ML IJ SOAJ: INTRAMUSCULAR | 1 refills | Status: AC

## 2021-11-16 MED ORDER — METOPROLOL SUCCINATE ER 25 MG PO TB24: 75.0000 mg | ORAL_TABLET | Freq: Every day | ORAL | 3 refills | Status: DC

## 2021-11-16 MED ORDER — FUROSEMIDE 40 MG PO TABS: 40.0000 mg | ORAL_TABLET | Freq: Every day | ORAL | 3 refills | Status: DC | PRN

## 2021-11-16 MED ORDER — ATORVASTATIN CALCIUM 40 MG PO TABS: 40.0000 mg | ORAL_TABLET | Freq: Every day | ORAL | 3 refills | Status: DC

## 2021-11-16 MED ORDER — EZETIMIBE 10 MG PO TABS: 10.0000 mg | ORAL_TABLET | Freq: Every day | ORAL | 3 refills | Status: DC

## 2021-11-16 MED ORDER — TRIAMCINOLONE ACETONIDE 0.1 % EX CREA: 1.0000 "application " | TOPICAL_CREAM | Freq: Two times a day (BID) | CUTANEOUS | 1 refills | Status: DC | PRN

## 2021-11-16 MED ORDER — POTASSIUM CHLORIDE CRYS ER 20 MEQ PO TBCR: 20.0000 meq | EXTENDED_RELEASE_TABLET | Freq: Every day | ORAL | 3 refills | Status: DC

## 2021-11-16 MED ORDER — RIVAROXABAN 20 MG PO TABS: 20.0000 mg | ORAL_TABLET | Freq: Every day | ORAL | 3 refills | Status: DC

## 2021-11-16 NOTE — Progress Notes (Signed)
[facility] [address] [place], [place] [address]  Internal MEDICINE  Office Visit Note  Patient Name: Casey Arias  [id number]  [id number]  Date of Service: 11/16/2021  Chief Complaint  Patient presents with   Follow-up    Wants handicap plate    Hypertension   Hyperlipidemia   COPD   Medication Refill    HPI Casey Arias presents for follow-up visit hypertension, hyperlipidemia, COPD and medication refills.  Patient also is requesting a letter for a handicap license plate for his vehicle.  Patient had lab work done with his cardiologist his lipid panel is much improved with an LDL of 59.  His white blood cell count was slightly elevated at 12.8 which may have been due to significant arthritic flareup around the time that he had his labs drawn.  Patient also is slightly hyponatremic with a sodium level of 132.  According to his prior labs, he has often had a slightly elevated glucose level even fasting.  A1c checked today in office and it was slightly elevated at 5.7 which is prediabetic.    Current Medication: Outpatient Encounter Medications as of 11/16/2021  Medication Sig   albuterol (VENTOLIN HFA) 108 (90 Base) MCG/ACT inhaler Inhale 2 puffs every 4 to 6 hrs as needed for sob   diclofenac Sodium (VOLTAREN) 1 % GEL Apply 2 g topically daily.   diltiazem (CARDIZEM) 30 MG tablet TAKE 1 TABLET (30 MG TOTAL) BY MOUTH 3 (THREE) TIMES DAILY AS NEEDED.   losartan (COZAAR) 25 MG tablet Take 1 tablet (25 mg total) by mouth daily.   Multiple Vitamins-Minerals (MULTIVITAMIN WITH MINERALS) tablet Take 1 tablet by mouth daily.   [DISCONTINUED] chlorhexidine (PERIDEX) 0.12 % solution Use as directed 15 mLs in the mouth or throat 2 (two) times daily.   [DISCONTINUED] metoprolol succinate (TOPROL-XL) 25 MG 24 hr tablet Take 3 tablets (75 mg total) by mouth daily. Take 4 tablets (100 mg) once daily with supper for hearts > 110, take 2 tablets (50 mg) for heart rates <110    [DISCONTINUED] potassium chloride SA (KLOR-CON) 20 MEQ tablet Take 1 tablet (20 mEq total) by mouth daily.   [DISCONTINUED] rivaroxaban (XARELTO) 20 MG TABS tablet TAKE 1 TABLET BY MOUTH DAILY WITH SUPPER   [DISCONTINUED] triamcinolone cream (KENALOG) 0.1 % Apply 1 application topically 2 (two) times daily as needed (irritation). Apply to affected area twice a day as needed   atorvastatin (LIPITOR) 40 MG tablet Take 1 tablet (40 mg total) by mouth daily.   chlorhexidine (PERIDEX) 0.12 % solution Use as directed 15 mLs in the mouth or throat 2 (two) times daily.   EPINEPHrine (EPIPEN 2-PAK) 0.3 mg/0.3 mL IJ SOAJ injection Use as directed  as needed for anaphylaxis   ezetimibe (ZETIA) 10 MG tablet Take 1 tablet (10 mg total) by mouth daily.   furosemide (LASIX) 40 MG tablet Take 1 tablet (40 mg total) by mouth daily as needed. Take with potassium as needed for 3 pounds weight gain   metoprolol succinate (TOPROL-XL) 25 MG 24 hr tablet Take 3 tablets (75 mg total) by mouth daily. Take 4 tablets (100 mg) once daily with supper for hearts > 110, take 2 tablets (50 mg) for heart rates <110   potassium chloride SA (KLOR-CON M) 20 MEQ tablet Take 1 tablet (20 mEq total) by mouth daily.   rivaroxaban (XARELTO) 20 MG TABS tablet Take 1 tablet (20 mg total) by mouth daily with supper.   triamcinolone cream (KENALOG) 0.1 %  Apply 1 application. topically 2 (two) times daily as needed (irritation). Apply to affected area twice a day as needed   [DISCONTINUED] amoxicillin-clavulanate (AUGMENTIN) 875-125 MG tablet Take 1 tablet by mouth 2 (two) times daily. (Patient not taking: Reported on 10/10/2021)   [DISCONTINUED] atorvastatin (LIPITOR) 40 MG tablet Take 1 tablet (40 mg total) by mouth daily.   [DISCONTINUED] EPINEPHrine (EPIPEN 2-PAK) 0.3 mg/0.3 mL IJ SOAJ injection Use as directed  as needed for anaphylaxis (Patient not taking: Reported on 10/10/2021)   [DISCONTINUED] ezetimibe (ZETIA) 10 MG tablet Take 1 tablet  (10 mg total) by mouth daily.   [DISCONTINUED] furosemide (LASIX) 40 MG tablet Take 1 tablet (40 mg total) by mouth daily as needed. Take with potassium as needed for 3 pounds weight gain   No facility-administered encounter medications on file as of 11/16/2021.    Surgical History: Past Surgical History:  Procedure Laterality Date   HERNIA REPAIR     HOLEP-LASER ENUCLEATION OF THE PROSTATE WITH MORCELLATION N/A 01/05/2020   Procedure: HOLEP-LASER ENUCLEATION OF THE PROSTATE WITH MORCELLATION;  Surgeon: Vanna Scotland, MD;  Location: [facility] ORS;  Service: Urology;  Laterality: N/A;   KNEE ARTHROSCOPY Left    LEG SURGERY Left    MANDIBLE SURGERY     TONSILLECTOMY      Medical History: Past Medical History:  Diagnosis Date   Atrial fibrillation (HCC)    COPD (chronic obstructive pulmonary disease) (HCC)    Dilated cardiomyopathy (HCC)    Heart murmur    HFrEF (heart failure with reduced ejection fraction) (HCC)    Hyperlipidemia    Hypertension    PAD (peripheral artery disease) (HCC)    PVC (premature ventricular contraction)     Family History: Family History  Problem Relation Age of Onset   Lymphoma Father    Bladder Cancer Mother    Prostate cancer Paternal Grandfather    Kidney cancer Neg Hx     Social History   Socioeconomic History   Marital status: Married    Spouse name: Product/process development scientist   Number of children: Not on file   Years of education: Not on file   Highest education level: Not on file  Occupational History   Not on file  Tobacco Use   Smoking status: Former    Types: Cigarettes   Smokeless tobacco: Never   Tobacco comments:    quit 27yrs ago  Vaping Use   Vaping Use: Never used  Substance and Sexual Activity   Alcohol use: Never   Drug use: Never   Sexual activity: Not on file  Other Topics Concern   Not on file  Social History Narrative   retired and lives at home with his wife   Was a Engineer, 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   Social Determinants of Research scientist (physical sciences) Strain: Low Risk    Difficulty of Paying Living Expenses: Not hard at all  Food Insecurity: No Food Insecurity   Worried About Programme researcher, broadcasting/film/video in the Last Year: Never true   Barista in the Last Year: Never true  Transportation Needs: No Transportation Needs   Lack of Transportation (Medical): No   Lack of Transportation (Non-Medical): No  Physical Activity: Insufficiently Active   Days of Exercise per Week: 3 days   Minutes of Exercise per Session: 30 min  Stress: No Stress Concern Present   Feeling of Stress : Only a little  Social Connections: Press photographer of Communication with Friends and  Family: Three times a week   Frequency of Social Gatherings with Friends and Family: Three times a week   Attends Religious Services: 1 to 4 times per year   Active Member of Clubs or Organizations: Yes   Attends Banker Meetings: 1 to 4 times per year   Marital Status: Married  Catering manager Violence: Not At Risk   Fear of Current or Ex-Partner: No   Emotionally Abused: No   Physically Abused: No   Sexually Abused: No      Review of Systems  Constitutional:  Negative for chills, fatigue and unexpected weight change.  HENT:  Negative for congestion, rhinorrhea, sneezing and sore throat.   Eyes:  Negative for redness.  Respiratory:  Negative for cough, chest tightness and shortness of breath.   Cardiovascular:  Negative for chest pain and palpitations.  Gastrointestinal:  Negative for abdominal pain, constipation, diarrhea, nausea and vomiting.  Genitourinary:  Negative for dysuria and frequency.  Musculoskeletal:  Negative for arthralgias, back pain, joint swelling and neck pain.  Skin:  Negative for rash.  Neurological: Negative.  Negative for tremors and numbness.  Hematological:  Negative for adenopathy. Does not bruise/bleed easily.  Psychiatric/Behavioral:  Negative for behavioral problems (Depression), sleep disturbance and  suicidal ideas. The patient is not nervous/anxious.    Vital Signs: BP 140/63   Pulse 60   Temp 98.3 F (36.8 C)   Resp 16   Ht  (1.854 m)   Wt 237 lb 3.2 oz (107.6 kg)   SpO2 97%   BMI 31.29 kg/m    Physical Exam Vitals reviewed.  Constitutional:      General: He is not in acute distress.    Appearance: Normal appearance. He is obese. He is not ill-appearing.  HENT:     Head: Normocephalic and atraumatic.  Eyes:     Pupils: Pupils are equal, round, and reactive to light.  Cardiovascular:     Rate and Rhythm: Normal rate and regular rhythm.  Pulmonary:     Effort: Pulmonary effort is normal. No respiratory distress.  Neurological:     Mental Status: He is alert and oriented to person, place, and time.  Psychiatric:        Mood and Affect: Mood normal.        Behavior: Behavior normal.       Assessment/Plan: 1. Essential hypertension BP stable with current medications all refills ordered - potassium chloride SA (KLOR-CON M) 20 MEQ tablet; Take 1 tablet (20 mEq total) by mouth daily.  Dispense: 90 tablet; Refill: 3  2. HFrEF (heart failure with reduced ejection fraction) (HCC) All meds from cardiology refilled as previously ordered.  - metoprolol succinate (TOPROL-XL) 25 MG 24 hr tablet; Take 3 tablets (75 mg total) by mouth daily. Take 4 tablets (100 mg) once daily with supper for hearts > 110, take 2 tablets (50 mg) for heart rates <110  Dispense: 270 tablet; Refill: 3 - furosemide (LASIX) 40 MG tablet; Take 1 tablet (40 mg total) by mouth daily as needed. Take with potassium as needed for 3 pounds weight gain  Dispense: 90 tablet; Refill: 3 - atorvastatin (LIPITOR) 40 MG tablet; Take 1 tablet (40 mg total) by mouth daily.  Dispense: 90 tablet; Refill: 3 - ezetimibe (ZETIA) 10 MG tablet; Take 1 tablet (10 mg total) by mouth daily.  Dispense: 90 tablet; Refill: 3  3. Prediabetes Patient on statin therapy. Aware of slightly elevated a1c 5.7 discussed simple diet  modifications that  will improve his a1c and aid in weight loss. Repeat a1c in September.  - atorvastatin (LIPITOR) 40 MG tablet; Take 1 tablet (40 mg total) by mouth daily.  Dispense: 90 tablet; Refill: 3 - ezetimibe (ZETIA) 10 MG tablet; Take 1 tablet (10 mg total) by mouth daily.  Dispense: 90 tablet; Refill: 3  4. Stenosis of right carotid artery Continue medications as prescribed. Refills ordered - atorvastatin (LIPITOR) 40 MG tablet; Take 1 tablet (40 mg total) by mouth daily.  Dispense: 90 tablet; Refill: 3 - ezetimibe (ZETIA) 10 MG tablet; Take 1 tablet (10 mg total) by mouth daily.  Dispense: 90 tablet; Refill: 3 - rivaroxaban (XARELTO) 20 MG TABS tablet; Take 1 tablet (20 mg total) by mouth daily with supper.  Dispense: 90 tablet; Refill: 3  5. Chronic obstructive pulmonary disease with acute exacerbation (HCC) Currently stable. No significant changes  6. Impaired fasting glucose A1C done in office elevated at 5.7 - POCT glycosylated hemoglobin (Hb A1C)  7. Closed fracture of alveolar process of mandible, unspecified laterality, sequela (HCC) - chlorhexidine (PERIDEX) 0.12 % solution; Use as directed 15 mLs in the mouth or throat 2 (two) times daily.  Dispense: 1893 mL; Refill: 1  8. Atopic dermatitis, unspecified type - triamcinolone cream (KENALOG) 0.1 %; Apply 1 application. topically 2 (two) times daily as needed (irritation). 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 I have discussed any further diagnostic evaluation that may be needed or ordered today. We also reviewed his medications today. he has been encouraged to call the office with any questions or concerns  that should arise related to todays visit.    Orders Placed This Encounter  Procedures   POCT glycosylated hemoglobin (Hb A1C)    Meds ordered this encounter  Medications   triamcinolone cream (KENALOG) 0.1 %    Sig: Apply 1 application. topically 2 (two) times daily as needed (irritation). Apply to affected area twice a day as needed    Dispense:  80 g    Refill:  1   chlorhexidine (PERIDEX) 0.12 % solution    Sig: Use as directed 15 mLs in the mouth or throat 2 (two) times daily.    Dispense:  1893 mL    Refill:  1   metoprolol succinate (TOPROL-XL) 25 MG 24 hr tablet    Sig: Take 3 tablets (75 mg total) by mouth daily. Take 4 tablets (100 mg) once daily with supper for hearts > 110, take 2 tablets (50 mg) for heart rates <110    Dispense:  270 tablet    Refill:  3    For future refills   furosemide (LASIX) 40 MG tablet    Sig: Take 1 tablet (40 mg total) by mouth daily as needed. Take with potassium as needed for 3 pounds weight gain    Dispense:  90 tablet    Refill:  3    For future refills   atorvastatin (LIPITOR) 40 MG tablet    Sig: Take 1 tablet (40 mg total) by mouth daily.    Dispense:  90 tablet    Refill:  3    For future refills   ezetimibe (  ZETIA) 10 MG tablet    Sig: Take 1 tablet (10 mg total) by mouth daily.    Dispense:  90 tablet    Refill:  3    For future refills   rivaroxaban (XARELTO) 20 MG TABS tablet    Sig: Take 1 tablet (20 mg total) by mouth daily with supper.    Dispense:  90 tablet    Refill:  3    For future refills, also please fill 90 day supply per patient request.   EPINEPHrine (EPIPEN 2-PAK) 0.3 mg/0.3 mL IJ SOAJ injection    Sig: Use as directed  as needed for anaphylaxis    Dispense:  2 each    Refill:  1   potassium chloride SA (KLOR-CON M) 20 MEQ tablet    Sig: Take 1 tablet (20 mEq total) by mouth daily.    Dispense:  90 tablet    Refill:  3    For future refills    Return for previously scheduled, CPE, Casey Arias PCP in  september.   Total time spent:30 Minutes Time spent includes review of chart, medications, test results, and follow up plan with the patient.   [place] Controlled Substance Database was reviewed by me.  This patient was seen by [name], FNP-C in collaboration with Dr. Beverely Risen as a part of collaborative care agreement.   Casey Herrin R. Tedd Sias, MSN, FNP-C Internal medicine

## 2022-01-03 ENCOUNTER — Ambulatory Visit (INDEPENDENT_AMBULATORY_CARE_PROVIDER_SITE_OTHER): Payer: Medicare HMO

## 2022-01-03 DIAGNOSIS — I6521 Occlusion and stenosis of right carotid artery: Secondary | ICD-10-CM | POA: Diagnosis not present

## 2022-01-04 ENCOUNTER — Telehealth: Payer: Self-pay | Admitting: Emergency Medicine

## 2022-01-04 NOTE — Telephone Encounter (Signed)
-----   Message from Antonieta Iba, MD sent at 01/04/2022  8:47 AM EDT ----- Carotid ultrasound 60 to 79% disease on the right, minimal on the left We will need to reevaluate 1 year Cholesterol is at goal Notes indicate he quit smoking

## 2022-01-04 NOTE — Telephone Encounter (Signed)
Called and spoke with patient. Results reviewed with patient, pt verbalized understanding,  questions (if any) answered.   ?

## 2022-01-10 ENCOUNTER — Telehealth: Payer: Self-pay | Admitting: Emergency Medicine

## 2022-01-10 NOTE — Telephone Encounter (Signed)
-----   Message from Antonieta Iba, MD sent at 01/06/2022  1:20 PM EDT ----- Carotid ultrasound Right carotid 60 to 79% stenosis Left carotid 1 to 39% stenosis Minimal change Cholesterol at goal, repeat 1 year

## 2022-01-10 NOTE — Telephone Encounter (Signed)
Entered in error

## 2022-01-17 ENCOUNTER — Other Ambulatory Visit: Payer: Self-pay | Admitting: *Deleted

## 2022-01-17 ENCOUNTER — Encounter: Payer: Self-pay | Admitting: *Deleted

## 2022-01-17 NOTE — Patient Outreach (Signed)
  Care Coordination   Follow Up Visit Note   01/17/2022 Name: Casey Arias MRN: [id number] DOB: 12/10/1945  Casey Arias is a 76 y.o. year old male who sees [name], NP for primary care. I spoke with  Shirley Muscat by phone today  What matters to the patients health and wellness today?  Handicap sticker application not provided by his pcp office   He [place] now social[place] Allowed him time to ventilate his feelings Discussed [facility] change of assigned RN CM  He agrees to the change He was offered to be able to outreach to RN CM prn     Goals Addressed   None     SDOH assessments and interventions completed:   Yes SDOH Interventions Today    Flowsheet Row Most Recent Value  SDOH Interventions   Food Insecurity Interventions Intervention Not Indicated  Financial Strain Interventions Intervention Not Indicated  Housing Interventions Intervention Not Indicated  Stress Interventions Intervention Not Indicated  Social Connections Interventions Intervention Not Indicated  Transportation Interventions Intervention Not Indicated       Care Coordination Interventions Activated:  Yes Care Coordination Interventions:  Yes, provided Mailed patient a copy of an application for disability parking placard    Follow up plan: No further intervention required.  Encounter Outcome:  Pt. Visit Completed Pending further possible outreach from [facility] [place] RN CM prn    [name], RN, BSN, CCM [facility] Telephonic Care Management Care Coordinator Office number [phone] [facility] number [phone] Fax number [phone]

## 2022-01-18 ENCOUNTER — Other Ambulatory Visit: Payer: Self-pay | Admitting: *Deleted

## 2022-01-18 NOTE — Patient Outreach (Signed)
[facility]) Care Management  01/18/2022  Casey Arias 28-Dec-1945 [id number]   [facility] Care coordination- missed calls, unsuccessful returned call  Noted a few missed calls from patient Returned a call  [facility] Unsuccessful outreach Outreach attempt to the listed at the preferred outreach number in EPIC  No answer. [facility] RN CM left HIPAA [facility] Portability and Accountability Act) compliant voicemail message along with CM's contact info.      [name] L. Noelle Penner, RN, BSN, CCM [facility] Telephonic Care Management Care Coordinator Office number [phone]

## 2022-02-13 ENCOUNTER — Telehealth: Payer: Self-pay

## 2022-02-13 NOTE — Telephone Encounter (Signed)
Left vm to confirm 02/20/22 appointment-Toni

## 2022-02-20 ENCOUNTER — Encounter: Payer: Self-pay | Admitting: Nurse Practitioner

## 2022-02-20 ENCOUNTER — Ambulatory Visit (INDEPENDENT_AMBULATORY_CARE_PROVIDER_SITE_OTHER): Payer: Medicare HMO | Admitting: Nurse Practitioner

## 2022-02-20 VITALS — BP 140/82 | HR 58 | Temp 98.4°F | Resp 16 | Ht 73.0 in | Wt 245.4 lb

## 2022-02-20 DIAGNOSIS — N138 Other obstructive and reflux uropathy: Secondary | ICD-10-CM | POA: Diagnosis not present

## 2022-02-20 DIAGNOSIS — Z125 Encounter for screening for malignant neoplasm of prostate: Secondary | ICD-10-CM

## 2022-02-20 DIAGNOSIS — R300 Dysuria: Secondary | ICD-10-CM

## 2022-02-20 DIAGNOSIS — N401 Enlarged prostate with lower urinary tract symptoms: Secondary | ICD-10-CM

## 2022-02-20 DIAGNOSIS — I5020 Unspecified systolic (congestive) heart failure: Secondary | ICD-10-CM

## 2022-02-20 DIAGNOSIS — I10 Essential (primary) hypertension: Secondary | ICD-10-CM

## 2022-02-20 DIAGNOSIS — R7303 Prediabetes: Secondary | ICD-10-CM | POA: Diagnosis not present

## 2022-02-20 DIAGNOSIS — Z0001 Encounter for general adult medical examination with abnormal findings: Secondary | ICD-10-CM

## 2022-02-20 DIAGNOSIS — Z760 Encounter for issue of repeat prescription: Secondary | ICD-10-CM

## 2022-02-20 DIAGNOSIS — D72828 Other elevated white blood cell count: Secondary | ICD-10-CM | POA: Diagnosis not present

## 2022-02-20 DIAGNOSIS — I6521 Occlusion and stenosis of right carotid artery: Secondary | ICD-10-CM

## 2022-02-20 MED ORDER — RIVAROXABAN 20 MG PO TABS: 20.0000 mg | ORAL_TABLET | Freq: Every day | ORAL | 11 refills | Status: DC
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
## 2022-02-21 LAB — UA/M W/RFLX CULTURE, ROUTINE
Bilirubin, UA: NEGATIVE
Glucose, UA: NEGATIVE
Ketones, UA: NEGATIVE
Leukocytes,UA: NEGATIVE
Nitrite, UA: NEGATIVE
Protein,UA: NEGATIVE
RBC, UA: NEGATIVE
Specific Gravity, UA: <=1.005 — AB (ref 1.005–1.030)
Urobilinogen, Ur: 0.2 mg/dL (ref 0.2–1.0)
pH, UA: 7 (ref 5.0–7.5)

## 2022-02-21 LAB — MICROSCOPIC EXAMINATION
Bacteria, UA: NONE SEEN
Casts: NONE SEEN /lpf
Epithelial Cells (non renal): NONE SEEN /hpf (ref 0–10)
WBC, UA: NONE SEEN /hpf (ref 0–5)

## 2022-03-22 ENCOUNTER — Other Ambulatory Visit: Payer: Self-pay | Admitting: Cardiovascular Disease

## 2022-03-22 DIAGNOSIS — I6523 Occlusion and stenosis of bilateral carotid arteries: Secondary | ICD-10-CM

## 2022-04-23 ENCOUNTER — Encounter: Payer: Self-pay | Admitting: Nurse Practitioner

## 2022-04-23 DIAGNOSIS — R7303 Prediabetes: Secondary | ICD-10-CM | POA: Insufficient documentation

## 2022-04-23 DIAGNOSIS — I6521 Occlusion and stenosis of right carotid artery: Secondary | ICD-10-CM | POA: Insufficient documentation

## 2022-04-23 DIAGNOSIS — I5020 Unspecified systolic (congestive) heart failure: Secondary | ICD-10-CM | POA: Insufficient documentation

## 2022-04-23 DIAGNOSIS — I10 Essential (primary) hypertension: Secondary | ICD-10-CM | POA: Insufficient documentation

## 2022-05-22 ENCOUNTER — Ambulatory Visit: Payer: Medicare HMO | Admitting: Nurse Practitioner

## 2022-05-29 ENCOUNTER — Ambulatory Visit (INDEPENDENT_AMBULATORY_CARE_PROVIDER_SITE_OTHER): Payer: Medicare HMO | Admitting: Nurse Practitioner

## 2022-05-29 ENCOUNTER — Encounter: Payer: Self-pay | Admitting: Nurse Practitioner

## 2022-05-29 VITALS — BP 125/74 | HR 68 | Temp 98.3°F | Resp 16 | Ht 73.0 in | Wt 251.6 lb

## 2022-05-29 DIAGNOSIS — I10 Essential (primary) hypertension: Secondary | ICD-10-CM

## 2022-05-29 DIAGNOSIS — Z760 Encounter for issue of repeat prescription: Secondary | ICD-10-CM | POA: Diagnosis not present

## 2022-05-29 DIAGNOSIS — R052 Subacute cough: Secondary | ICD-10-CM

## 2022-05-29 MED ORDER — METOPROLOL SUCCINATE ER 25 MG PO TB24: ORAL_TABLET | ORAL | 3 refills | Status: DC

## 2022-05-29 MED ORDER — TRIAMCINOLONE ACETONIDE 0.1 % EX CREA: 1.0000 | TOPICAL_CREAM | Freq: Two times a day (BID) | CUTANEOUS | 2 refills | Status: DC | PRN

## 2022-05-29 MED ORDER — GUAIFENESIN 200 MG/5ML PO LIQD: 200.0000 mg | Freq: Two times a day (BID) | ORAL | 2 refills | Status: DC | PRN

## 2022-05-29 NOTE — Progress Notes (Signed)
[facility] [address] [place], [place] [address]  Internal MEDICINE  Office Visit Note  Patient Name: Casey Arias  [id number]  [id number]  Date of Service: 05/29/2022  Chief Complaint  Patient presents with   Follow-up   Hyperlipidemia   Hypertension    HPI Autry presents for a follow up visit for hypertension, elevated heart rate and high cholesterol.  Hypertension -- BP greatly improved. Gained 6 lbs since last visit.  Has had to take more metoprolol for his elevated heart rate per instructions from cardiology Need refills Need cough syrup, asking for guaifenesin without DM   Current Medication: Outpatient Encounter Medications as of 05/29/2022  Medication Sig   albuterol (VENTOLIN HFA) 108 (90 Base) MCG/ACT inhaler Inhale 2 puffs every 4 to 6 hrs as needed for sob   atorvastatin (LIPITOR) 40 MG tablet Take 1 tablet (40 mg total) by mouth daily.   chlorhexidine (PERIDEX) 0.12 % solution Use as directed 15 mLs in the mouth or throat 2 (two) times daily.   diclofenac Sodium (VOLTAREN) 1 % GEL Apply 2 g topically daily.   diltiazem (CARDIZEM) 30 MG tablet TAKE 1 TABLET (30 MG TOTAL) BY MOUTH 3 (THREE) TIMES DAILY AS NEEDED.   EPINEPHrine (EPIPEN 2-PAK) 0.3 mg/0.3 mL IJ SOAJ injection Use as directed  as needed for anaphylaxis   ezetimibe (ZETIA) 10 MG tablet Take 1 tablet (10 mg total) by mouth daily.   furosemide (LASIX) 40 MG tablet Take 1 tablet (40 mg total) by mouth daily as needed. Take with potassium as needed for 3 pounds weight gain   Guaifenesin 200 MG/5ML LIQD Take 5 mLs (200 mg total) by mouth every 12 (twelve) hours as needed (cough).   Multiple Vitamins-Minerals (MULTIVITAMIN WITH MINERALS) tablet Take 1 tablet by mouth daily.   potassium chloride SA (KLOR-CON M) 20 MEQ tablet Take 1 tablet (20 mEq total) by mouth daily.   rivaroxaban (XARELTO) 20 MG TABS tablet Take 1 tablet (20 mg total) by mouth daily with supper.   [DISCONTINUED]  metoprolol succinate (TOPROL-XL) 25 MG 24 hr tablet Take 3 tablets (75 mg total) by mouth daily. Take 4 tablets (100 mg) once daily with supper for hearts > 110, take 2 tablets (50 mg) for heart rates <110   [DISCONTINUED] triamcinolone cream (KENALOG) 0.1 % Apply 1 application. topically 2 (two) times daily as needed (irritation). Apply to affected area twice a day as needed   metoprolol succinate (TOPROL-XL) 25 MG 24 hr tablet Take 3 tablets (75 mg total) by mouth daily. Take 4 tablets (100 mg) once daily with supper for hearts > 110, take 2 tablets (50 mg) for heart rates <110   triamcinolone cream (KENALOG) 0.1 % Apply 1 Application topically 2 (two) times daily as needed (irritation). Apply to affected area twice a day as needed   No facility-administered encounter medications on file as of 05/29/2022.    Surgical History: Past Surgical History:  Procedure Laterality Date   HERNIA REPAIR     HOLEP-LASER ENUCLEATION OF THE PROSTATE WITH MORCELLATION N/A 01/05/2020   Procedure: HOLEP-LASER ENUCLEATION OF THE PROSTATE WITH MORCELLATION;  Surgeon: Vanna Scotland, MD;  Location: [facility] ORS;  Service: Urology;  Laterality: N/A;   KNEE ARTHROSCOPY Left    LEG SURGERY Left    MANDIBLE SURGERY     TONSILLECTOMY      Medical History: Past Medical History:  Diagnosis Date   Atrial fibrillation (HCC)    COPD (chronic obstructive pulmonary disease) (HCC)  Dilated cardiomyopathy (HCC)    Heart murmur    HFrEF (heart failure with reduced ejection fraction) (HCC)    Hyperlipidemia    Hypertension    PAD (peripheral artery disease) (HCC)    PVC (premature ventricular contraction)     Family History: Family History  Problem Relation Age of Onset   Lymphoma Father    Bladder Cancer Mother    Prostate cancer Paternal Grandfather    Kidney cancer Neg Hx     Social History   Socioeconomic History   Marital status: Married    Spouse name: Kirt Boys   Number of children: Not on file   Years  of education: Not on file   Highest education level: Not on file  Occupational History   Not on file  Tobacco Use   Smoking status: Former    Types: Cigarettes   Smokeless tobacco: Never   Tobacco comments:    quit 58yrs ago  Vaping Use   Vaping Use: Never used  Substance and Sexual Activity   Alcohol use: Never   Drug use: Never   Sexual activity: Not on file  Other Topics Concern   Not on file  Social History Narrative   retired and lives at home with his wife   Was a Engineer, 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   Social Determinants of Health   Financial Resource Strain: Low Risk  (01/17/2022)   Overall Financial Resource Strain (CARDIA)    Difficulty of Paying Living Expenses: Not hard at all  Food Insecurity: No Food Insecurity (01/17/2022)   Hunger Vital Sign    Worried About Running Out of Food in the Last Year: Never true    Ran Out of Food in the Last Year: Never true  Transportation Needs: No Transportation Needs (01/17/2022)   PRAPARE - Administrator, Civil Service (Medical): No    Lack of Transportation (Non-Medical): No  Physical Activity: Insufficiently Active (08/26/2021)   Exercise Vital Sign    Days of Exercise per Week: 3 days    Minutes of Exercise per Session: 30 min  Stress: No Stress Concern Present (01/17/2022)   [employer] of Occupational Health - Occupational Stress Questionnaire    Feeling of Stress : Not at all  Social Connections: Socially Integrated (01/17/2022)   Social Connection and Isolation Panel [NHANES]    Frequency of Communication with Friends and Family: Twice a week    Frequency of Social Gatherings with Friends and Family: Twice a week    Attends Religious Services: 1 to 4 times per year    Active Member of [employer] or Organizations: Yes    Attends Banker Meetings: 1 to 4 times per year    Marital Status: Married  Catering manager Violence: Not At Risk (01/17/2022)   Humiliation, Afraid, Rape, and Kick questionnaire    Fear of Current  or Ex-Partner: No    Emotionally Abused: No    Physically Abused: No    Sexually Abused: No      Review of Systems  Constitutional:  Negative for chills, fatigue and unexpected weight change.  HENT:  Negative for congestion, rhinorrhea, sneezing and sore throat.   Eyes:  Negative for redness.  Respiratory:  Negative for cough, chest tightness and shortness of breath.   Cardiovascular:  Negative for chest pain and palpitations.  Gastrointestinal:  Negative for abdominal pain, constipation, diarrhea, nausea and vomiting.  Genitourinary:  Negative for dysuria and frequency.  Musculoskeletal:  Negative for arthralgias, back pain, joint swelling  and neck pain.  Skin:  Negative for rash.  Neurological: Negative.  Negative for tremors and numbness.  Hematological:  Negative for adenopathy. Does not bruise/bleed easily.  Psychiatric/Behavioral:  Negative for behavioral problems (Depression), sleep disturbance and suicidal ideas. The patient is not nervous/anxious.     Vital Signs: BP 125/74   Pulse 68   Temp 98.3 F (36.8 C)   Resp 16   Ht 6\' 1"  (1.854 m)   Wt 251 lb 9.6 oz (114.1 kg)   SpO2 99%   BMI 33.19 kg/m    Physical Exam Vitals reviewed.  Constitutional:      General: He is not in acute distress.    Appearance: Normal appearance. He is obese. He is not ill-appearing.  HENT:     Head: Normocephalic and atraumatic.  Eyes:     Pupils: Pupils are equal, round, and reactive to light.  Cardiovascular:     Rate and Rhythm: Normal rate and regular rhythm.  Pulmonary:     Effort: Pulmonary effort is normal. No respiratory distress.  Neurological:     Mental Status: He is alert and oriented to person, place, and time.  Psychiatric:        Mood and Affect: Mood normal.        Behavior: Behavior normal.        Assessment/Plan: 1. Essential hypertension BP much improved, continue medications as prescribed  2. Subacute cough Guaifenesin prescribed, but is OTC so  will see if insurance will cover - Guaifenesin 200 MG/5ML LIQD; Take 5 mLs (200 mg total) by mouth every 12 (twelve) hours as needed (cough).  Dispense: 118 mL; Refill: 2  3. Medication refill - triamcinolone cream (KENALOG) 0.1 %; Apply 1 Application topically 2 (two) times daily as needed (irritation). Apply to affected area twice a day as needed  Dispense: 80 g; Refill: 2 - metoprolol succinate (TOPROL-XL) 25 MG 24 hr tablet; Take 3 tablets (75 mg total) by mouth daily. Take 4 tablets (100 mg) once daily with supper for hearts > 110, take 2 tablets (50 mg) for heart rates <110  Dispense: 360 tablet; Refill: 3   General Counseling: brydon wenzinger understanding of the findings of todays visit and agrees with plan of treatment. I have discussed any further diagnostic evaluation that may be needed or ordered today. We also reviewed his medications today. he has been encouraged to call the office with any questions or concerns that should arise related to todays visit.    No orders of the defined types were placed in this encounter.   Meds ordered this encounter  Medications   Guaifenesin 200 MG/5ML LIQD    Sig: Take 5 mLs (200 mg total) by mouth every 12 (twelve) hours as needed (cough).    Dispense:  118 mL    Refill:  2    Please fill if possible   triamcinolone cream (KENALOG) 0.1 %    Sig: Apply 1 Application topically 2 (two) times daily as needed (irritation). Apply to affected area twice a day as needed    Dispense:  80 g    Refill:  2   metoprolol succinate (TOPROL-XL) 25 MG 24 hr tablet    Sig: Take 3 tablets (75 mg total) by mouth daily. Take 4 tablets (100 mg) once daily with supper for hearts > 110, take 2 tablets (50 mg) for heart rates <110    Dispense:  360 tablet    Refill:  3    Please discontinue previous  orders for metoprolol and fill new script today, thank you.    Return in about 3 months (around 08/28/2022) for F/U, Labs, Meisha Salone PCP.   Total time spent:30  Minutes Time spent includes review of chart, medications, test results, and follow up plan with the patient.   [place] Controlled Substance Database was reviewed by me.  This patient was seen by [name], FNP-C in collaboration with Dr. Beverely Risen as a part of collaborative care agreement.   Jarom Govan R. Tedd Sias, MSN, FNP-C Internal medicine

## 2022-05-31 ENCOUNTER — Other Ambulatory Visit: Payer: Self-pay | Admitting: Nurse Practitioner

## 2022-05-31 MED ORDER — METOPROLOL SUCCINATE ER 25 MG PO TB24: ORAL_TABLET | ORAL | 3 refills | Status: DC

## 2022-05-31 NOTE — Addendum Note (Signed)
Addended by: [name] on: 05/31/2022 08:13 AM   Modules accepted: Orders

## 2022-06-25 ENCOUNTER — Other Ambulatory Visit: Payer: Self-pay | Admitting: Nurse Practitioner

## 2022-06-25 DIAGNOSIS — S02670S Fracture of alveolus of mandible, unspecified side, sequela: Secondary | ICD-10-CM

## 2022-06-27 NOTE — Telephone Encounter (Signed)
Please review

## 2022-08-10 ENCOUNTER — Other Ambulatory Visit: Payer: Self-pay | Admitting: Cardiovascular Disease

## 2022-08-28 ENCOUNTER — Ambulatory Visit: Payer: Medicare HMO | Admitting: Nurse Practitioner

## 2022-09-18 ENCOUNTER — Other Ambulatory Visit: Payer: Self-pay | Admitting: Nurse Practitioner

## 2022-09-18 DIAGNOSIS — N138 Other obstructive and reflux uropathy: Secondary | ICD-10-CM | POA: Diagnosis not present

## 2022-09-18 DIAGNOSIS — N401 Enlarged prostate with lower urinary tract symptoms: Secondary | ICD-10-CM | POA: Diagnosis not present

## 2022-09-18 DIAGNOSIS — D72828 Other elevated white blood cell count: Secondary | ICD-10-CM | POA: Diagnosis not present

## 2022-09-18 DIAGNOSIS — R7303 Prediabetes: Secondary | ICD-10-CM | POA: Diagnosis not present

## 2022-09-18 DIAGNOSIS — I5020 Unspecified systolic (congestive) heart failure: Secondary | ICD-10-CM | POA: Diagnosis not present

## 2022-09-19 LAB — CMP14+EGFR
ALT: 19 IU/L (ref 0–44)
AST: 22 IU/L (ref 0–40)
Albumin/Globulin Ratio: 1.6 (ref 1.2–2.2)
Albumin: 4.3 g/dL (ref 3.8–4.8)
Alkaline Phosphatase: 91 IU/L (ref 44–121)
BUN/Creatinine Ratio: 10 (ref 10–24)
BUN: 9 mg/dL (ref 8–27)
Bilirubin Total: 0.5 mg/dL (ref 0.0–1.2)
CO2: 25 mmol/L (ref 20–29)
Calcium: 9.7 mg/dL (ref 8.6–10.2)
Chloride: 99 mmol/L (ref 96–106)
Creatinine, Ser: 0.86 mg/dL (ref 0.76–1.27)
Globulin, Total: 2.7 g/dL (ref 1.5–4.5)
Glucose: 106 mg/dL — ABNORMAL HIGH (ref 70–99)
Potassium: 4.5 mmol/L (ref 3.5–5.2)
Sodium: 137 mmol/L (ref 134–144)
Total Protein: 7 g/dL (ref 6.0–8.5)
eGFR: 91 mL/min/{1.73_m2} (ref >59)

## 2022-09-19 LAB — CBC WITH DIFFERENTIAL/PLATELET
Basophils Absolute: 0.1 10*3/uL (ref 0.0–0.2)
Basos: 1 %
EOS (ABSOLUTE): 0.2 10*3/uL (ref 0.0–0.4)
Eos: 3 %
Hematocrit: 44.1 % (ref 37.5–51.0)
Hemoglobin: 15 g/dL (ref 13.0–17.7)
Immature Grans (Abs): 0 10*3/uL (ref 0.0–0.1)
Immature Granulocytes: 0 %
Lymphocytes Absolute: 2.3 10*3/uL (ref 0.7–3.1)
Lymphs: 27 %
MCH: 29.8 pg (ref 26.6–33.0)
MCHC: 34 g/dL (ref 31.5–35.7)
MCV: 88 fL (ref 79–97)
Monocytes Absolute: 0.9 10*3/uL (ref 0.1–0.9)
Monocytes: 10 %
Neutrophils Absolute: 5.2 10*3/uL (ref 1.4–7.0)
Neutrophils: 59 %
Platelets: 297 10*3/uL (ref 150–450)
RBC: 5.04 x10E6/uL (ref 4.14–5.80)
RDW: 12.2 % (ref 11.6–15.4)
WBC: 8.7 10*3/uL (ref 3.4–10.8)

## 2022-09-19 LAB — PSA TOTAL (REFLEX TO FREE): Prostate Specific Ag, Serum: 0.8 ng/mL (ref 0.0–4.0)

## 2022-09-19 LAB — HGB A1C W/O EAG: Hgb A1c MFr Bld: 6.3 % — ABNORMAL HIGH (ref 4.8–5.6)

## 2022-09-28 ENCOUNTER — Ambulatory Visit (INDEPENDENT_AMBULATORY_CARE_PROVIDER_SITE_OTHER): Payer: Medicare HMO | Admitting: Nurse Practitioner

## 2022-09-28 ENCOUNTER — Encounter: Payer: Self-pay | Admitting: Nurse Practitioner

## 2022-09-28 VITALS — BP 128/85 | HR 78 | Temp 96.9°F | Resp 16 | Ht 73.0 in | Wt 259.2 lb

## 2022-09-28 DIAGNOSIS — R052 Subacute cough: Secondary | ICD-10-CM

## 2022-09-28 DIAGNOSIS — R7303 Prediabetes: Secondary | ICD-10-CM | POA: Diagnosis not present

## 2022-09-28 DIAGNOSIS — I5020 Unspecified systolic (congestive) heart failure: Secondary | ICD-10-CM

## 2022-09-28 MED ORDER — GUAIFENESIN 200 MG/5ML PO LIQD: 200.0000 mg | Freq: Two times a day (BID) | ORAL | 2 refills | Status: DC | PRN

## 2022-09-28 MED ORDER — ATORVASTATIN CALCIUM 40 MG PO TABS: 40.0000 mg | ORAL_TABLET | Freq: Every day | ORAL | 3 refills | Status: DC

## 2022-09-28 MED ORDER — EZETIMIBE 10 MG PO TABS: 10.0000 mg | ORAL_TABLET | Freq: Every day | ORAL | 3 refills | Status: DC

## 2022-09-28 MED ORDER — FUROSEMIDE 40 MG PO TABS: 40.0000 mg | ORAL_TABLET | Freq: Every day | ORAL | 3 refills | Status: DC | PRN

## 2022-09-28 NOTE — Progress Notes (Signed)
[facility] [place], [place] [address]  Internal MEDICINE  Office Visit Note  Patient Name: Casey Arias  [id number]  [id number]  Date of Service: 09/28/2022  Chief Complaint  Patient presents with   Hypertension   Hyperlipidemia   Follow-up    Review labs.     HPI Bronson presents for a follow-up visit for lab results and refills  Prediabetes -- A1c 6.3, has been exercising and changing his diet.  PSA normal CBC normal Sodium improved Kidney and liver function normal Needs medications refills    Current Medication: Outpatient Encounter Medications as of 09/28/2022  Medication Sig   albuterol (VENTOLIN HFA) 108 (90 Base) MCG/ACT inhaler Inhale 2 puffs every 4 to 6 hrs as needed for sob   chlorhexidine (PERIDEX) 0.12 % solution USE AS DIRECTED 15 ML IN THE MOUTH OR THROAT TWICE A DAY *SPIT, DO NOT SWALLOW*   diclofenac Sodium (VOLTAREN) 1 % GEL Apply 2 g topically daily.   EPINEPHrine (EPIPEN 2-PAK) 0.3 mg/0.3 mL IJ SOAJ injection Use as directed  as needed for anaphylaxis   metoprolol succinate (TOPROL-XL) 25 MG 24 hr tablet Take 3 tablets (75 mg total) by mouth daily. Take 1 additional tablet once daily with supper for HR > 110.   Multiple Vitamins-Minerals (MULTIVITAMIN WITH MINERALS) tablet Take 1 tablet by mouth daily.   potassium chloride SA (KLOR-CON M) 20 MEQ tablet Take 1 tablet (20 mEq total) by mouth daily.   rivaroxaban (XARELTO) 20 MG TABS tablet Take 1 tablet (20 mg total) by mouth daily with supper.   triamcinolone cream (KENALOG) 0.1 % Apply 1 Application topically 2 (two) times daily as needed (irritation). Apply to affected area twice a day as needed   [DISCONTINUED] atorvastatin (LIPITOR) 40 MG tablet Take 1 tablet (40 mg total) by mouth daily.   [DISCONTINUED] diltiazem (CARDIZEM) 30 MG tablet TAKE 1 TABLET (30 MG TOTAL) BY MOUTH 3 (THREE) TIMES DAILY AS NEEDED.   [DISCONTINUED] ezetimibe (ZETIA) 10 MG tablet Take 1 tablet  (10 mg total) by mouth daily.   [DISCONTINUED] furosemide (LASIX) 40 MG tablet Take 1 tablet (40 mg total) by mouth daily as needed. Take with potassium as needed for 3 pounds weight gain   [DISCONTINUED] Guaifenesin 200 MG/5ML LIQD Take 5 mLs (200 mg total) by mouth every 12 (twelve) hours as needed (cough).   atorvastatin (LIPITOR) 40 MG tablet Take 1 tablet (40 mg total) by mouth daily.   ezetimibe (ZETIA) 10 MG tablet Take 1 tablet (10 mg total) by mouth daily.   furosemide (LASIX) 40 MG tablet Take 1 tablet (40 mg total) by mouth daily as needed. Take with potassium as needed for 3 pounds weight gain   Guaifenesin 200 MG/5ML LIQD Take 5 mLs (200 mg total) by mouth every 12 (twelve) hours as needed (cough).   No facility-administered encounter medications on file as of 09/28/2022.    Surgical History: Past Surgical History:  Procedure Laterality Date   HERNIA REPAIR     HOLEP-LASER ENUCLEATION OF THE PROSTATE WITH MORCELLATION N/A 01/05/2020   Procedure: HOLEP-LASER ENUCLEATION OF THE PROSTATE WITH MORCELLATION;  Surgeon: Hollice Espy, MD;  Location: [facility] ORS;  Service: Urology;  Laterality: N/A;   KNEE ARTHROSCOPY Left    LEG SURGERY Left    MANDIBLE SURGERY     TONSILLECTOMY      Medical History: Past Medical History:  Diagnosis Date   Atrial fibrillation    COPD (chronic obstructive pulmonary disease)  Dilated cardiomyopathy    Heart murmur    HFrEF (heart failure with reduced ejection fraction)    Hyperlipidemia    Hypertension    PAD (peripheral artery disease)    PVC (premature ventricular contraction)     Family History: Family History  Problem Relation Age of Onset   Lymphoma Father    Bladder Cancer Mother    Prostate cancer Paternal Grandfather    Kidney cancer Neg Hx     Social History   Socioeconomic History   Marital status: Married    Spouse name: Cloyde Reams   Number of children: Not on file   Years of education: Not on file   Highest education  level: Not on file  Occupational History   Not on file  Tobacco Use   Smoking status: Former    Types: Cigarettes   Smokeless tobacco: Never   Tobacco comments:    quit 60yrs ago  Vaping Use   Vaping Use: Never used  Substance and Sexual Activity   Alcohol use: Never   Drug use: Never   Sexual activity: Not on file  Other Topics Concern   Not on file  Social History Narrative   retired and lives at home with his wife   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   Social Determinants of Health   Financial Resource Strain: Low Risk  (01/17/2022)   Overall Financial Resource Strain (CARDIA)    Difficulty of Paying Living Expenses: Not hard at all  Food Insecurity: No Food Insecurity (01/17/2022)   Hunger Vital Sign    Worried About Running Out of Food in the Last Year: Never true    Ran Out of Food in the Last Year: Never true  Transportation Needs: No Transportation Needs (01/17/2022)   PRAPARE - Hydrologist (Medical): No    Lack of Transportation (Non-Medical): No  Physical Activity: Insufficiently Active (08/26/2021)   Exercise Vital Sign    Days of Exercise per Week: 3 days    Minutes of Exercise per Session: 30 min  Stress: No Stress Concern Present (01/17/2022)   [place]    Feeling of Stress : Not at all  Social Connections: [place] (01/17/2022)   Social Connection and Isolation Panel [NHANES]    Frequency of Communication with Friends and Family: Twice a week    Frequency of Social Gatherings with Friends and Family: Twice a week    Attends Religious Services: 1 to 4 times per year    Active Member of [employer] or Organizations: Yes    Attends Archivist Meetings: 1 to 4 times per year    Marital Status: Married  Human resources officer Violence: Not At Risk (01/17/2022)   Humiliation, Afraid, Rape, and Kick questionnaire    Fear of Current or Ex-Partner: No    Emotionally Abused: No     Physically Abused: No    Sexually Abused: No      Review of Systems  Constitutional:  Negative for chills, fatigue and unexpected weight change.  HENT:  Negative for congestion, rhinorrhea, sneezing and sore throat.   Eyes:  Negative for redness.  Respiratory:  Negative for cough, chest tightness and shortness of breath.   Cardiovascular:  Negative for chest pain and palpitations.  Gastrointestinal:  Negative for abdominal pain, constipation, diarrhea, nausea and vomiting.  Genitourinary:  Negative for dysuria and frequency.  Musculoskeletal:  Negative for arthralgias, back pain, joint swelling and neck pain.  Skin:  Negative for rash.  Neurological: Negative.  Negative for tremors and numbness.  Hematological:  Negative for adenopathy. Does not bruise/bleed easily.  Psychiatric/Behavioral:  Negative for behavioral problems (Depression), sleep disturbance and suicidal ideas. The patient is not nervous/anxious.     Vital Signs: BP 128/85   Pulse 78   Temp (!) 96.9 F (36.1 C)   Resp 16   Ht 6\' 1"  (1.854 m)   Wt 259 lb 3.2 oz (117.6 kg)   SpO2 95%   BMI 34.20 kg/m    Physical Exam Vitals reviewed.  Constitutional:      General: He is not in acute distress.    Appearance: Normal appearance. He is obese. He is not ill-appearing.  HENT:     Head: Normocephalic and atraumatic.  Eyes:     Pupils: Pupils are equal, round, and reactive to light.  Cardiovascular:     Rate and Rhythm: Normal rate and regular rhythm.  Pulmonary:     Effort: Pulmonary effort is normal. No respiratory distress.  Neurological:     Mental Status: He is alert and oriented to person, place, and time.  Psychiatric:        Mood and Affect: Mood normal.        Behavior: Behavior normal.        Assessment/Plan: 1. Prediabetes A1c elevated to 6.3, continue diet and lifestyle modifications. Continue atorvastatin and ezetimibe as prescribed.  - atorvastatin (LIPITOR) 40 MG tablet; Take 1 tablet  (40 mg total) by mouth daily.  Dispense: 90 tablet; Refill: 3 - ezetimibe (ZETIA) 10 MG tablet; Take 1 tablet (10 mg total) by mouth daily.  Dispense: 90 tablet; Refill: 3  2. Subacute cough Refills ordered, take guaifenesin as needed.  - Guaifenesin 200 MG/5ML LIQD; Take 5 mLs (200 mg total) by mouth every 12 (twelve) hours as needed (cough).  Dispense: 118 mL; Refill: 2  3. HFrEF (heart failure with reduced ejection fraction) Refills ordered, continue medications as prescribed.  - atorvastatin (LIPITOR) 40 MG tablet; Take 1 tablet (40 mg total) by mouth daily.  Dispense: 90 tablet; Refill: 3 - ezetimibe (ZETIA) 10 MG tablet; Take 1 tablet (10 mg total) by mouth daily.  Dispense: 90 tablet; Refill: 3 - furosemide (LASIX) 40 MG tablet; Take 1 tablet (40 mg total) by mouth daily as needed. Take with potassium as needed for 3 pounds weight gain  Dispense: 90 tablet; Refill: 3   General Counseling: admiral wieser understanding of the findings of todays visit and agrees with plan of treatment. I have discussed any further diagnostic evaluation that may be needed or ordered today. We also reviewed his medications today. he has been encouraged to call the office with any questions or concerns that should arise related to todays visit.    No orders of the defined types were placed in this encounter.   Meds ordered this encounter  Medications   Guaifenesin 200 MG/5ML LIQD    Sig: Take 5 mLs (200 mg total) by mouth every 12 (twelve) hours as needed (cough).    Dispense:  118 mL    Refill:  2    Please fill if possible   atorvastatin (LIPITOR) 40 MG tablet    Sig: Take 1 tablet (40 mg total) by mouth daily.    Dispense:  90 tablet    Refill:  3    For future refills   ezetimibe (ZETIA) 10 MG tablet    Sig: Take 1 tablet (10 mg total) by  mouth daily.    Dispense:  90 tablet    Refill:  3    For future refills   furosemide (LASIX) 40 MG tablet    Sig: Take 1 tablet (40 mg total) by  mouth daily as needed. Take with potassium as needed for 3 pounds weight gain    Dispense:  90 tablet    Refill:  3    For future refills    Return for previously scheduled medicare wellness visit in september with Kensie Susman.   Total time spent:30 Minutes Time spent includes review of chart, medications, test results, and follow up plan with the patient.   [place] Controlled Substance Database was reviewed by me.  This patient was seen by Jonetta Osgood, FNP-C in collaboration with Dr. Clayborn Bigness as a part of collaborative care agreement.   Petro Talent R. Valetta Fuller, MSN, FNP-C Internal medicine

## 2022-10-05 ENCOUNTER — Other Ambulatory Visit: Payer: Self-pay | Admitting: Nurse Practitioner

## 2022-10-05 DIAGNOSIS — Z760 Encounter for issue of repeat prescription: Secondary | ICD-10-CM
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
## 2022-10-16 ENCOUNTER — Ambulatory Visit: Payer: Medicare HMO | Admitting: Cardiovascular Disease

## 2022-10-16 DIAGNOSIS — I6521 Occlusion and stenosis of right carotid artery: Secondary | ICD-10-CM

## 2022-10-16 DIAGNOSIS — I420 Dilated cardiomyopathy: Secondary | ICD-10-CM

## 2022-10-16 DIAGNOSIS — R0602 Shortness of breath: Secondary | ICD-10-CM

## 2022-10-16 DIAGNOSIS — E782 Mixed hyperlipidemia: Secondary | ICD-10-CM

## 2022-10-16 DIAGNOSIS — J432 Centrilobular emphysema: Secondary | ICD-10-CM

## 2022-10-16 DIAGNOSIS — I493 Ventricular premature depolarization: Secondary | ICD-10-CM

## 2022-10-16 DIAGNOSIS — Z7901 Long term (current) use of anticoagulants: Secondary | ICD-10-CM

## 2022-10-16 DIAGNOSIS — I739 Peripheral vascular disease, unspecified: Secondary | ICD-10-CM

## 2022-10-24 ENCOUNTER — Ambulatory Visit: Payer: Self-pay

## 2022-10-24 ENCOUNTER — Telehealth: Payer: Self-pay | Admitting: Nurse Practitioner

## 2022-10-24 NOTE — Telephone Encounter (Signed)
Patient called stating he missed call from our office, per notes, Lysle Morales, social worker, attempted to reach him. I gave patient her telephone #-Toni

## 2022-10-24 NOTE — Patient Outreach (Signed)
  Care Coordination   10/24/2022 Name: Casey Arias MRN: [id number] DOB: 07/14/1945   Care Coordination Outreach Attempts:  A second unsuccessful outreach was attempted today to offer the patient with information about available care coordination services as a benefit of their health plan.     Follow Up Plan:  Additional outreach attempts will be made to offer the patient care coordination information and services.   Encounter Outcome:  No Answer   Care Coordination Interventions:  No, not indicated    SIG Lysle Morales, BSW Social Worker [facility]  [phone]

## 2022-10-26 ENCOUNTER — Ambulatory Visit: Payer: Self-pay

## 2022-10-26 NOTE — Patient Outreach (Signed)
  Care Coordination   10/26/2022 Name: Casey Arias MRN: [id number] DOB: [birth year]   Care Coordination Outreach Attempts:  A third unsuccessful outreach was attempted today to offer the patient with information about available care coordination services.  Follow Up Plan:  No further outreach attempts will be made at this time. We have been unable to contact the patient to offer or enroll patient in care coordination services  Encounter Outcome:  No Answer   Care Coordination Interventions:  No, not indicated    SIG Lysle Morales, BSW Social Worker [facility] Care Management  [phone]

## 2023-01-05 ENCOUNTER — Other Ambulatory Visit: Payer: Self-pay | Admitting: Nurse Practitioner

## 2023-01-05 DIAGNOSIS — Z760 Encounter for issue of repeat prescription: Secondary | ICD-10-CM
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
## 2023-01-15 ENCOUNTER — Ambulatory Visit: Payer: Medicare HMO | Attending: Cardiovascular Disease | Admitting: Cardiovascular Disease

## 2023-01-15 ENCOUNTER — Encounter: Payer: Self-pay | Admitting: Cardiovascular Disease

## 2023-01-15 VITALS — BP 134/82 | HR 70 | Ht 73.0 in | Wt 261.2 lb

## 2023-01-15 DIAGNOSIS — I739 Peripheral vascular disease, unspecified: Secondary | ICD-10-CM | POA: Diagnosis not present

## 2023-01-15 DIAGNOSIS — I420 Dilated cardiomyopathy: Secondary | ICD-10-CM

## 2023-01-15 DIAGNOSIS — R0602 Shortness of breath: Secondary | ICD-10-CM

## 2023-01-15 DIAGNOSIS — J432 Centrilobular emphysema: Secondary | ICD-10-CM | POA: Diagnosis not present

## 2023-01-15 DIAGNOSIS — R7303 Prediabetes: Secondary | ICD-10-CM | POA: Diagnosis not present

## 2023-01-15 DIAGNOSIS — I10 Essential (primary) hypertension: Secondary | ICD-10-CM | POA: Diagnosis not present

## 2023-01-15 DIAGNOSIS — E782 Mixed hyperlipidemia: Secondary | ICD-10-CM | POA: Diagnosis not present

## 2023-01-15 DIAGNOSIS — I493 Ventricular premature depolarization: Secondary | ICD-10-CM | POA: Diagnosis not present

## 2023-01-15 DIAGNOSIS — I6521 Occlusion and stenosis of right carotid artery: Secondary | ICD-10-CM

## 2023-01-15 NOTE — Patient Instructions (Signed)

## 2023-01-26 ENCOUNTER — Other Ambulatory Visit: Payer: Self-pay | Admitting: Nurse Practitioner

## 2023-01-26 DIAGNOSIS — Z760 Encounter for issue of repeat prescription: Secondary | ICD-10-CM

## 2023-02-09 ENCOUNTER — Other Ambulatory Visit: Payer: Self-pay | Admitting: Nurse Practitioner

## 2023-02-09 DIAGNOSIS — I10 Essential (primary) hypertension: Secondary | ICD-10-CM

## 2023-02-21 ENCOUNTER — Ambulatory Visit: Payer: Medicare HMO

## 2023-02-27 ENCOUNTER — Ambulatory Visit (INDEPENDENT_AMBULATORY_CARE_PROVIDER_SITE_OTHER): Payer: Medicare HMO | Admitting: Nurse Practitioner

## 2023-02-27 ENCOUNTER — Encounter: Payer: Self-pay | Admitting: Nurse Practitioner

## 2023-02-27 VITALS — BP 153/107 | HR 60 | Temp 98.3°F | Resp 16 | Ht 73.0 in | Wt 264.0 lb

## 2023-02-27 DIAGNOSIS — Z1211 Encounter for screening for malignant neoplasm of colon: Secondary | ICD-10-CM

## 2023-02-27 DIAGNOSIS — Z0001 Encounter for general adult medical examination with abnormal findings: Secondary | ICD-10-CM

## 2023-02-27 DIAGNOSIS — R7303 Prediabetes: Secondary | ICD-10-CM

## 2023-02-27 DIAGNOSIS — I5020 Unspecified systolic (congestive) heart failure: Secondary | ICD-10-CM | POA: Diagnosis not present

## 2023-02-27 DIAGNOSIS — I4891 Unspecified atrial fibrillation: Secondary | ICD-10-CM

## 2023-02-27 DIAGNOSIS — Z79899 Other long term (current) drug therapy: Secondary | ICD-10-CM

## 2023-02-27 DIAGNOSIS — R300 Dysuria: Secondary | ICD-10-CM

## 2023-02-27 MED ORDER — RIVAROXABAN 20 MG PO TABS
20.0000 mg | ORAL_TABLET | Freq: Every day | ORAL | 11 refills | Status: DC
Start: 2023-02-27 — End: 2024-02-28

## 2023-02-27 MED ORDER — AMIODARONE HCL 100 MG PO TABS
100.0000 mg | ORAL_TABLET | Freq: Two times a day (BID) | ORAL | 4 refills | Status: DC
Start: 2023-03-20 — End: 2023-08-21

## 2023-02-27 MED ORDER — AMIODARONE HCL 200 MG PO TABS: 200.0000 mg | ORAL_TABLET | Freq: Two times a day (BID) | ORAL | 0 refills | Status: DC
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
## 2023-02-28 ENCOUNTER — Other Ambulatory Visit: Payer: Self-pay | Admitting: Nurse Practitioner

## 2023-02-28 LAB — UA/M W/RFLX CULTURE, ROUTINE
Bilirubin, UA: NEGATIVE
Glucose, UA: NEGATIVE
Ketones, UA: NEGATIVE
Leukocytes,UA: NEGATIVE
Nitrite, UA: NEGATIVE
Protein,UA: NEGATIVE
Specific Gravity, UA: 1.019 (ref 1.005–1.030)
Urobilinogen, Ur: 0.2 mg/dL (ref 0.2–1.0)
pH, UA: 5.5 (ref 5.0–7.5)

## 2023-02-28 LAB — MICROSCOPIC EXAMINATION
Bacteria, UA: NONE SEEN
Casts: NONE SEEN /LPF
Epithelial Cells (non renal): NONE SEEN /HPF (ref 0–10)

## 2023-02-28 NOTE — Telephone Encounter (Signed)
Please review

## 2023-03-20 ENCOUNTER — Ambulatory Visit: Payer: Medicare HMO | Admitting: Nurse Practitioner

## 2023-03-23 ENCOUNTER — Ambulatory Visit: Payer: Medicare HMO | Attending: Cardiovascular Disease

## 2023-03-28 ENCOUNTER — Other Ambulatory Visit: Payer: Self-pay | Admitting: Nurse Practitioner

## 2023-03-28 DIAGNOSIS — I4891 Unspecified atrial fibrillation: Secondary | ICD-10-CM

## 2023-03-28 NOTE — Telephone Encounter (Signed)
Please review

## 2023-03-30 ENCOUNTER — Telehealth: Payer: Self-pay | Admitting: Nurse Practitioner

## 2023-03-30 NOTE — Telephone Encounter (Signed)
Lvm regarding labs not done-Toni

## 2023-04-03 ENCOUNTER — Ambulatory Visit: Payer: Medicare HMO | Admitting: Nurse Practitioner

## 2023-04-06 ENCOUNTER — Encounter: Payer: Self-pay | Admitting: Nurse Practitioner

## 2023-04-07 LAB — LIPID PANEL
Chol/HDL Ratio: 3.9 {ratio} (ref 0.0–5.0)
Cholesterol, Total: 163 mg/dL (ref 100–199)
HDL: 42 mg/dL (ref >39)
LDL Chol Calc (NIH): 106 mg/dL — ABNORMAL HIGH (ref 0–99)
Triglycerides: 78 mg/dL (ref 0–149)
VLDL Cholesterol Cal: 15 mg/dL (ref 5–40)

## 2023-04-07 LAB — CMP14+EGFR
ALT: 11 [IU]/L (ref 0–44)
AST: 16 [IU]/L (ref 0–40)
Albumin: 4.3 g/dL (ref 3.8–4.8)
Alkaline Phosphatase: 89 [IU]/L (ref 44–121)
BUN/Creatinine Ratio: 12 (ref 10–24)
BUN: 10 mg/dL (ref 8–27)
Bilirubin Total: 0.6 mg/dL (ref 0.0–1.2)
CO2: 25 mmol/L (ref 20–29)
Calcium: 9.8 mg/dL (ref 8.6–10.2)
Chloride: 98 mmol/L (ref 96–106)
Creatinine, Ser: 0.85 mg/dL (ref 0.76–1.27)
Globulin, Total: 2.8 g/dL (ref 1.5–4.5)
Glucose: 97 mg/dL (ref 70–99)
Potassium: 4.7 mmol/L (ref 3.5–5.2)
Sodium: 136 mmol/L (ref 134–144)
Total Protein: 7.1 g/dL (ref 6.0–8.5)
eGFR: 91 mL/min/{1.73_m2} (ref >59)

## 2023-04-07 LAB — HGB A1C W/O EAG: Hgb A1c MFr Bld: 6.5 % — ABNORMAL HIGH (ref 4.8–5.6)

## 2023-04-18 ENCOUNTER — Encounter: Payer: Self-pay | Admitting: Nurse Practitioner

## 2023-04-18 ENCOUNTER — Ambulatory Visit: Payer: Medicare HMO | Admitting: Nurse Practitioner

## 2023-04-18 VITALS — BP 134/71 | HR 71 | Temp 98.3°F | Resp 16 | Ht 73.0 in | Wt 261.4 lb

## 2023-04-18 DIAGNOSIS — I4891 Unspecified atrial fibrillation: Secondary | ICD-10-CM | POA: Diagnosis not present

## 2023-04-18 DIAGNOSIS — E1169 Type 2 diabetes mellitus with other specified complication: Secondary | ICD-10-CM

## 2023-04-18 DIAGNOSIS — E1165 Type 2 diabetes mellitus with hyperglycemia: Secondary | ICD-10-CM

## 2023-04-18 DIAGNOSIS — R3989 Other symptoms and signs involving the genitourinary system: Secondary | ICD-10-CM | POA: Diagnosis not present

## 2023-04-18 DIAGNOSIS — I5020 Unspecified systolic (congestive) heart failure: Secondary | ICD-10-CM | POA: Diagnosis not present

## 2023-04-18 DIAGNOSIS — R82998 Other abnormal findings in urine: Secondary | ICD-10-CM | POA: Diagnosis not present

## 2023-04-18 NOTE — Progress Notes (Signed)
[facility] [address] [place], [place] [address]  Internal MEDICINE  Office Visit Note  Patient Name: Casey Arias  [id number]  [id number]  Date of Service: 04/18/2023  Chief Complaint  Patient presents with   Hyperlipidemia   Hypertension   Follow-up    Lab review    HPI Junayd presents for a follow-up visit for diabetes, heart failure, atrial fibrillation and issues with urine.  Diabetes -- A1c is elevated at 6.5 when recently checked.  Heart failure -- sees cardiology Atrial fibrillation -- improved and controlled with amiodarone and metoprolol Dark yellow frothy urine     Current Medication: Outpatient Encounter Medications as of 04/18/2023  Medication Sig   albuterol (VENTOLIN HFA) 108 (90 Base) MCG/ACT inhaler Inhale 2 puffs every 4 to 6 hrs as needed for sob   amiodarone (PACERONE) 100 MG tablet Take 1 tablet (100 mg total) by mouth 2 (two) times daily.   atorvastatin (LIPITOR) 40 MG tablet Take 1 tablet (40 mg total) by mouth daily.   chlorhexidine (PERIDEX) 0.12 % solution USE AS DIRECTED 15 ML IN THE MOUTH OR THROAT TWICE A DAY *SPIT, DO NOT SWALLOW*   diclofenac Sodium (VOLTAREN) 1 % GEL Apply 2 g topically daily.   EPINEPHrine (EPIPEN 2-PAK) 0.3 mg/0.3 mL IJ SOAJ injection Use as directed  as needed for anaphylaxis   ezetimibe (ZETIA) 10 MG tablet Take 1 tablet (10 mg total) by mouth daily.   furosemide (LASIX) 40 MG tablet Take 1 tablet (40 mg total) by mouth daily as needed. Take with potassium as needed for 3 pounds weight gain   Guaifenesin 200 MG/5ML LIQD Take 5 mLs (200 mg total) by mouth every 12 (twelve) hours as needed (cough).   KLOR-CON M20 20 MEQ tablet TAKE 1 TABLET BY MOUTH EVERY DAY   Multiple Vitamins-Minerals (MULTIVITAMIN WITH MINERALS) tablet Take 1 tablet by mouth daily.   rivaroxaban (XARELTO) 20 MG TABS tablet Take 1 tablet (20 mg total) by mouth daily with supper.   triamcinolone cream (KENALOG) 0.1 % APPLY 1  APPLICATION TOPICALLY 2 TIMES DAILY TO THE AFFECTED AREA AS NEEDED (IRRITATION)   [DISCONTINUED] amiodarone (PACERONE) 200 MG tablet TAKE 1 TABLET BY MOUTH TWICE A DAY   [DISCONTINUED] metoprolol succinate (TOPROL-XL) 25 MG 24 hr tablet TAKE 4 TABS ONCE DAILY WITH SUPPER FOR HEART RATES > 110, TAKE 2 TABS FOR HEART RATES <110   No facility-administered encounter medications on file as of 04/18/2023.    Surgical History: Past Surgical History:  Procedure Laterality Date   HERNIA REPAIR     HOLEP-LASER ENUCLEATION OF THE PROSTATE WITH MORCELLATION N/A 01/05/2020   Procedure: HOLEP-LASER ENUCLEATION OF THE PROSTATE WITH MORCELLATION;  Surgeon: Vanna Scotland, MD;  Location: [facility] ORS;  Service: Urology;  Laterality: N/A;   KNEE ARTHROSCOPY Left    LEG SURGERY Left    MANDIBLE SURGERY     TONSILLECTOMY      Medical History: Past Medical History:  Diagnosis Date   Atrial fibrillation (HCC)    COPD (chronic obstructive pulmonary disease) (HCC)    Dilated cardiomyopathy (HCC)    Heart murmur    HFrEF (heart failure with reduced ejection fraction) (HCC)    Hyperlipidemia    Hypertension    PAD (peripheral artery disease) (HCC)    PVC (premature ventricular contraction)     Family History: Family History  Problem Relation Age of Onset   Lymphoma Father    Bladder Cancer Mother    Prostate cancer Paternal  Grandfather    Kidney cancer Neg Hx     Social History   Socioeconomic History   Marital status: Married    Spouse name: Kirt Boys   Number of children: Not on file   Years of education: Not on file   Highest education level: Not on file  Occupational History   Not on file  Tobacco Use   Smoking status: Former    Types: Cigarettes   Smokeless tobacco: Never   Tobacco comments:    quit 63yrs ago  Vaping Use   Vaping status: Never Used  Substance and Sexual Activity   Alcohol use: Never   Drug use: Never   Sexual activity: Not on file  Other Topics Concern   Not on  file  Social History Narrative   retired and lives at home with his wife   Was a Engineer, 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   Social Determinants of Health   Financial Resource Strain: Low Risk  (01/17/2022)   Overall Financial Resource Strain (CARDIA)    Difficulty of Paying Living Expenses: Not hard at all  Food Insecurity: No Food Insecurity (01/17/2022)   Hunger Vital Sign    Worried About Running Out of Food in the Last Year: Never true    Ran Out of Food in the Last Year: Never true  Transportation Needs: No Transportation Needs (01/17/2022)   PRAPARE - Administrator, Civil Service (Medical): No    Lack of Transportation (Non-Medical): No  Physical Activity: Insufficiently Active (08/26/2021)   Exercise Vital Sign    Days of Exercise per Week: 3 days    Minutes of Exercise per Session: 30 min  Stress: No Stress Concern Present (01/17/2022)   [employer] of Occupational Health - Occupational Stress Questionnaire    Feeling of Stress : Not at all  Social Connections: Socially Integrated (01/17/2022)   Social Connection and Isolation Panel [NHANES]    Frequency of Communication with Friends and Family: Twice a week    Frequency of Social Gatherings with Friends and Family: Twice a week    Attends Religious Services: 1 to 4 times per year    Active Member of [employer] or Organizations: Yes    Attends Banker Meetings: 1 to 4 times per year    Marital Status: Married  Catering manager Violence: Not At Risk (01/17/2022)   Humiliation, Afraid, Rape, and Kick questionnaire    Fear of Current or Ex-Partner: No    Emotionally Abused: No    Physically Abused: No    Sexually Abused: No      Review of Systems  Vital Signs: BP 134/71   Pulse 71   Temp 98.3 F (36.8 C)   Resp 16   Ht 6\' 1"  (1.854 m)   Wt 261 lb 6.4 oz (118.6 kg)   SpO2 95%   BMI 34.49 kg/m    Physical Exam     Assessment/Plan: 1. HFrEF (heart failure with reduced ejection fraction) (HCC) Continue  medications as prescribed and continue following up with cardiology as scheduled.   2. Atrial fibrillation with RVR (HCC) Continue amiodarone 100 mg twice daily and metoprolol 50 mg daily   3. Type 2 diabetes mellitus with other specified complication, without long-term current use of insulin (HCC) Diet controlled. Continue diet modifications as discussed. Go to [facility] to provide urine specimen.  - Urinalysis, Routine w reflex microscopic - Urine Microalbumin w/creat. ratio - CULTURE, URINE COMPREHENSIVE  4. Dark yellow-colored urine Urinalysis, urine culture and urine  microalbumin/creatinine ratio ordered. Patient was unable to provide a urine sample in the office so the lab is ordered to be done at [facility] - Urinalysis, Routine w reflex microscopic - Urine Microalbumin w/creat. ratio - CULTURE, URINE COMPREHENSIVE  5. Frothy urine Urinalysis, urine culture and urine microalbumin/creatinine ratio ordered. Patient was unable to provide a urine sample in the office so the lab is ordered to be done at [facility] - Urinalysis, Routine w reflex microscopic - Urine Microalbumin w/creat. ratio - CULTURE, URINE COMPREHENSIVE   General Counseling: orvin ratte understanding of the findings of todays visit and agrees with plan of treatment. I have discussed any further diagnostic evaluation that may be needed or ordered today. We also reviewed his medications today. he has been encouraged to call the office with any questions or concerns that should arise related to todays visit.    Orders Placed This Encounter  Procedures   CULTURE, URINE COMPREHENSIVE   Urinalysis, Routine w reflex microscopic   Urine Microalbumin w/creat. ratio    No orders of the defined types were placed in this encounter.   Return in about 6 months (around 10/17/2023) for F/U, Recheck A1C, Curley Fayette PCP.   Total time spent:30 Minutes Time spent includes review of chart, medications, test results, and follow up  plan with the patient.   [place] Controlled Substance Database was reviewed by me.  This patient was seen by [name], FNP-C in collaboration with Dr. Beverely Risen as a part of collaborative care agreement.   Alyssha Housh R. Tedd Sias, MSN, FNP-C Internal medicine

## 2023-04-28 ENCOUNTER — Other Ambulatory Visit: Payer: Self-pay | Admitting: Nurse Practitioner

## 2023-04-28 DIAGNOSIS — I4891 Unspecified atrial fibrillation: Secondary | ICD-10-CM

## 2023-04-30 NOTE — Telephone Encounter (Signed)
Review and send

## 2023-05-14 ENCOUNTER — Other Ambulatory Visit: Payer: Self-pay | Admitting: Nurse Practitioner

## 2023-05-14 DIAGNOSIS — E1165 Type 2 diabetes mellitus with hyperglycemia: Secondary | ICD-10-CM | POA: Diagnosis not present

## 2023-05-14 DIAGNOSIS — R3989 Other symptoms and signs involving the genitourinary system: Secondary | ICD-10-CM | POA: Diagnosis not present

## 2023-05-14 DIAGNOSIS — R82998 Other abnormal findings in urine: Secondary | ICD-10-CM | POA: Diagnosis not present

## 2023-05-17 ENCOUNTER — Other Ambulatory Visit: Payer: Self-pay | Admitting: Nurse Practitioner

## 2023-05-17 DIAGNOSIS — Z760 Encounter for issue of repeat prescription: Secondary | ICD-10-CM

## 2023-05-18 LAB — URINALYSIS, ROUTINE W REFLEX MICROSCOPIC
Bilirubin, UA: NEGATIVE
Glucose, UA: NEGATIVE
Ketones, UA: NEGATIVE
Leukocytes,UA: NEGATIVE
Nitrite, UA: NEGATIVE
Protein,UA: NEGATIVE
Specific Gravity, UA: 1.015 (ref 1.005–1.030)
Urobilinogen, Ur: 0.2 mg/dL (ref 0.2–1.0)
pH, UA: 6 (ref 5.0–7.5)

## 2023-05-18 LAB — MICROSCOPIC EXAMINATION
Bacteria, UA: NONE SEEN
Casts: NONE SEEN /[LPF]
Epithelial Cells (non renal): NONE SEEN /[HPF] (ref 0–10)
WBC, UA: NONE SEEN /[HPF] (ref 0–5)

## 2023-05-18 LAB — CULTURE, URINE COMPREHENSIVE

## 2023-05-18 LAB — MICROALBUMIN / CREATININE URINE RATIO
Creatinine, Urine: 105.8 mg/dL
Microalb/Creat Ratio: 17 mg/g{creat} (ref 0–29)
Microalbumin, Urine: 18.4 ug/mL

## 2023-05-20 ENCOUNTER — Other Ambulatory Visit: Payer: Self-pay | Admitting: Cardiovascular Disease

## 2023-05-20 ENCOUNTER — Other Ambulatory Visit: Payer: Self-pay | Admitting: Nurse Practitioner

## 2023-05-20 DIAGNOSIS — I4891 Unspecified atrial fibrillation: Secondary | ICD-10-CM

## 2023-06-05 ENCOUNTER — Other Ambulatory Visit: Payer: Self-pay | Admitting: Nurse Practitioner

## 2023-06-05 ENCOUNTER — Telehealth: Payer: Self-pay

## 2023-06-05 ENCOUNTER — Encounter: Payer: Self-pay | Admitting: Nurse Practitioner

## 2023-06-05 ENCOUNTER — Telehealth (INDEPENDENT_AMBULATORY_CARE_PROVIDER_SITE_OTHER): Payer: Medicare HMO | Admitting: Nurse Practitioner

## 2023-06-05 VITALS — Resp 16 | Ht 73.0 in | Wt 260.0 lb

## 2023-06-05 DIAGNOSIS — J0110 Acute frontal sinusitis, unspecified: Secondary | ICD-10-CM | POA: Diagnosis not present

## 2023-06-05 DIAGNOSIS — R051 Acute cough: Secondary | ICD-10-CM

## 2023-06-05 DIAGNOSIS — Z760 Encounter for issue of repeat prescription: Secondary | ICD-10-CM

## 2023-06-05 MED ORDER — AMOXICILLIN-POT CLAVULANATE 875-125 MG PO TABS: 1.0000 | ORAL_TABLET | Freq: Two times a day (BID) | ORAL | 0 refills | Status: AC

## 2023-06-05 MED ORDER — HYDROCOD POLI-CHLORPHE POLI ER 10-8 MG/5ML PO SUER: 5.0000 mL | Freq: Two times a day (BID) | ORAL | 0 refills | Status: DC | PRN

## 2023-06-05 NOTE — Telephone Encounter (Signed)
Spoke to patient and patient made an appointment to do virtual this morning.

## 2023-06-05 NOTE — Progress Notes (Signed)
[facility] [address] [place], [place] [address]  Internal MEDICINE  Telephone Visit  Patient Name: Casey Arias  [id number]  [id number]  Date of Service: 06/05/2023  I connected with the patient at 0845 by telephone and verified the patients identity using two identifiers.   I discussed the limitations, risks, security and privacy concerns of performing an evaluation and management service by telephone and the availability of in person appointments. I also discussed with the patient that there may be a patient responsible charge related to the service.  The patient expressed understanding and agrees to proceed.    Chief Complaint  Patient presents with   Telephone Screen    Sneezing, runny nose, dry cough, yellow mucus, covid test negative since yesterday.    Telephone Assessment    HPI Jacquis presents for a telehealth virtual visit for symptoms of sinusitis Onset of symptoms was last Friday Reports runny nose, coughing, nasal congestion, headache, fever, chills, fatigue, sore throat, sinus pressure, SOB Negative for covid   Current Medication: Outpatient Encounter Medications as of 06/05/2023  Medication Sig   albuterol (VENTOLIN HFA) 108 (90 Base) MCG/ACT inhaler Inhale 2 puffs every 4 to 6 hrs as needed for sob   amiodarone (PACERONE) 100 MG tablet Take 1 tablet (100 mg total) by mouth 2 (two) times daily.   amiodarone (PACERONE) 200 MG tablet TAKE 1 TABLET BY MOUTH TWICE A DAY   amoxicillin-clavulanate (AUGMENTIN) 875-125 MG tablet Take 1 tablet by mouth 2 (two) times daily for 10 days.   atorvastatin (LIPITOR) 40 MG tablet Take 1 tablet (40 mg total) by mouth daily.   chlorhexidine (PERIDEX) 0.12 % solution USE AS DIRECTED 15 ML IN THE MOUTH OR THROAT TWICE A DAY *SPIT, DO NOT SWALLOW*   chlorpheniramine-HYDROcodone (TUSSIONEX) 10-8 MG/5ML Take 5 mLs by mouth every 12 (twelve) hours as needed for cough.   diclofenac Sodium (VOLTAREN) 1 % GEL Apply  2 g topically daily.   EPINEPHrine (EPIPEN 2-PAK) 0.3 mg/0.3 mL IJ SOAJ injection Use as directed  as needed for anaphylaxis   ezetimibe (ZETIA) 10 MG tablet Take 1 tablet (10 mg total) by mouth daily.   furosemide (LASIX) 40 MG tablet Take 1 tablet (40 mg total) by mouth daily as needed. Take with potassium as needed for 3 pounds weight gain   Guaifenesin 200 MG/5ML LIQD Take 5 mLs (200 mg total) by mouth every 12 (twelve) hours as needed (cough).   KLOR-CON M20 20 MEQ tablet TAKE 1 TABLET BY MOUTH EVERY DAY   losartan (COZAAR) 25 MG tablet TAKE 1 TABLET (25 MG TOTAL) BY MOUTH DAILY.   metoprolol succinate (TOPROL-XL) 25 MG 24 hr tablet TAKE 4 TABS ONCE DAILY WITH SUPPER FOR HEART RATES > 110, TAKE 2 TABS FOR HEART RATES <110   Multiple Vitamins-Minerals (MULTIVITAMIN WITH MINERALS) tablet Take 1 tablet by mouth daily.   rivaroxaban (XARELTO) 20 MG TABS tablet Take 1 tablet (20 mg total) by mouth daily with supper.   triamcinolone cream (KENALOG) 0.1 % APPLY 1 APPLICATION TOPICALLY 2 TIMES DAILY TO THE AFFECTED AREA AS NEEDED (IRRITATION)   No facility-administered encounter medications on file as of 06/05/2023.    Surgical History: Past Surgical History:  Procedure Laterality Date   HERNIA REPAIR     HOLEP-LASER ENUCLEATION OF THE PROSTATE WITH MORCELLATION N/A 01/05/2020   Procedure: HOLEP-LASER ENUCLEATION OF THE PROSTATE WITH MORCELLATION;  Surgeon: Vanna Scotland, MD;  Location: [facility] ORS;  Service: Urology;  Laterality: N/A;  KNEE ARTHROSCOPY Left    LEG SURGERY Left    MANDIBLE SURGERY     TONSILLECTOMY      Medical History: Past Medical History:  Diagnosis Date   Atrial fibrillation (HCC)    COPD (chronic obstructive pulmonary disease) (HCC)    Dilated cardiomyopathy (HCC)    Heart murmur    HFrEF (heart failure with reduced ejection fraction) (HCC)    Hyperlipidemia    Hypertension    PAD (peripheral artery disease) (HCC)    PVC (premature ventricular contraction)      Family History: Family History  Problem Relation Age of Onset   Lymphoma Father    Bladder Cancer Mother    Prostate cancer Paternal Grandfather    Kidney cancer Neg Hx     Social History   Socioeconomic History   Marital status: Married    Spouse name: Product/process development scientist   Number of children: Not on file   Years of education: Not on file   Highest education level: Not on file  Occupational History   Not on file  Tobacco Use   Smoking status: Former    Types: Cigarettes   Smokeless tobacco: Never   Tobacco comments:    quit 22yrs ago  Vaping Use   Vaping status: Never Used  Substance and Sexual Activity   Alcohol use: Never   Drug use: Never   Sexual activity: Not on file  Other Topics Concern   Not on file  Social History Narrative   retired and lives at home with his wife   Was a Engineer, 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   Social Determinants of Health   Financial Resource Strain: Low Risk  (01/17/2022)   Overall Financial Resource Strain (CARDIA)    Difficulty of Paying Living Expenses: Not hard at all  Food Insecurity: No Food Insecurity (01/17/2022)   Hunger Vital Sign    Worried About Running Out of Food in the Last Year: Never true    Ran Out of Food in the Last Year: Never true  Transportation Needs: No Transportation Needs (01/17/2022)   PRAPARE - Administrator, Civil Service (Medical): No    Lack of Transportation (Non-Medical): No  Physical Activity: Insufficiently Active (08/26/2021)   Exercise Vital Sign    Days of Exercise per Week: 3 days    Minutes of Exercise per Session: 30 min  Stress: No Stress Concern Present (01/17/2022)   [employer] of Occupational Health - Occupational Stress Questionnaire    Feeling of Stress : Not at all  Social Connections: Socially Integrated (01/17/2022)   Social Connection and Isolation Panel [NHANES]    Frequency of Communication with Friends and Family: Twice a week    Frequency of Social Gatherings with Friends and Family: Twice a  week    Attends Religious Services: 1 to 4 times per year    Active Member of [employer] or Organizations: Yes    Attends Banker Meetings: 1 to 4 times per year    Marital Status: Married  Catering manager Violence: Not At Risk (01/17/2022)   Humiliation, Afraid, Rape, and Kick questionnaire    Fear of Current or Ex-Partner: No    Emotionally Abused: No    Physically Abused: No    Sexually Abused: No      Review of Systems  Constitutional:  Positive for appetite change, chills, fatigue and fever.  HENT:  Positive for congestion, postnasal drip, rhinorrhea, sinus pressure, sinus pain, sore throat and voice change.   Respiratory:  Positive for cough and shortness of breath. Negative for chest tightness and wheezing.   Cardiovascular: Negative.  Negative for chest pain and palpitations.  Gastrointestinal:  Negative for diarrhea, nausea and vomiting.  Neurological:  Positive for headaches.    Vital Signs: Resp 16   Ht 6\' 1"  (1.854 m)   Wt 260 lb (117.9 kg)   BMI 34.30 kg/m    Observation/Objective: He is alert and oriented. No acute distress noted    Assessment/Plan: 1. Acute non-recurrent frontal sinusitis Antibiotic prescribed, take until gone. - amoxicillin-clavulanate (AUGMENTIN) 875-125 MG tablet; Take 1 tablet by mouth 2 (two) times daily for 10 days.  Dispense: 20 tablet; Refill: 0  2. Acute cough Cough medication prescribed for symptom relief.  - chlorpheniramine-HYDROcodone (TUSSIONEX) 10-8 MG/5ML; Take 5 mLs by mouth every 12 (twelve) hours as needed for cough.  Dispense: 140 mL; Refill: 0    General Counseling: blair brull understanding of the findings of today's phone visit and agrees with plan of treatment. I have discussed any further diagnostic evaluation that may be needed or ordered today. We also reviewed his medications today. he has been encouraged to call the office with any questions or concerns that should arise related to todays  visit.  Return if symptoms worsen or fail to improve.   No orders of the defined types were placed in this encounter.   Meds ordered this encounter  Medications   amoxicillin-clavulanate (AUGMENTIN) 875-125 MG tablet    Sig: Take 1 tablet by mouth 2 (two) times daily for 10 days.    Dispense:  20 tablet    Refill:  0    Fill new script today   chlorpheniramine-HYDROcodone (TUSSIONEX) 10-8 MG/5ML    Sig: Take 5 mLs by mouth every 12 (twelve) hours as needed for cough.    Dispense:  140 mL    Refill:  0    Fill new script now.    Time spent:10 Minutes Time spent with patient included reviewing progress notes, labs, imaging studies, and discussing plan for follow up.  [place] Controlled Substance Database was reviewed by me for overdose risk score (ORS) if appropriate.  This patient was seen by [name], FNP-C in collaboration with Dr. Beverely Risen as a part of collaborative care agreement.  Aldona Bryner R. Tedd Sias, MSN, FNP-C Internal medicine

## 2023-06-06 ENCOUNTER — Encounter: Payer: Self-pay | Admitting: Nurse Practitioner

## 2023-06-06 DIAGNOSIS — I4891 Unspecified atrial fibrillation: Secondary | ICD-10-CM | POA: Insufficient documentation

## 2023-06-06 DIAGNOSIS — E119 Type 2 diabetes mellitus without complications: Secondary | ICD-10-CM | POA: Insufficient documentation

## 2023-08-20 ENCOUNTER — Telehealth: Payer: Self-pay | Admitting: Cardiovascular Disease
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
## 2023-08-20 NOTE — Telephone Encounter (Signed)
 Called patient back about his message. Patient stated he started having palpitations, A. FIB and SOB on Friday, with a racing heart rate. Patient's HR has gotten as high as 125. Right now patient's BP 130/99 HR 75. Patient stated earlier it was BP 99/77 HR 125.  Encouraged patient to take his PRN metoprolol. Patient stated he has been taking his metoprolol. Asked patient about amiodarone prescribed by his PCP. Patient stated he is not taking this medication and never started on it. He stated his local pharmacist told him to consult his cardiologist first. Patient has an appointment with Dr. Mariah Milling tomorrow. Encouraged patient to go to ED if his symptoms get worse. Informed patient he should keep his appointment tomorrow to get evaluated.

## 2023-08-20 NOTE — Telephone Encounter (Signed)
 Patient c/o Palpitations:  High priority if patient c/o lightheadedness, shortness of breath, or chest pain  How long have you had palpitations/irregular HR/ Afib? Are you having the symptoms now? All weekend (in and out)  Are you currently experiencing lightheadedness, SOB or CP? SOB  Do you have a history of afib (atrial fibrillation) or irregular heart rhythm? Yes  Have you checked your BP or HR? (document readings if available): No Are you experiencing any other symptoms? Patient stated he's been in AFib since this weekend. Patient stated he has been having SOB while walking. Patient is scheduled for 08/21/23 with Dr. Mariah Milling.

## 2023-08-21 ENCOUNTER — Encounter: Payer: Self-pay | Admitting: Cardiovascular Disease

## 2023-08-21 ENCOUNTER — Ambulatory Visit: Payer: Medicare Other

## 2023-08-21 ENCOUNTER — Ambulatory Visit: Payer: Medicare Other | Attending: Cardiovascular Disease | Admitting: Cardiovascular Disease

## 2023-08-21 VITALS — BP 120/80 | HR 161 | Ht 73.0 in | Wt 265.5 lb

## 2023-08-21 DIAGNOSIS — I4891 Unspecified atrial fibrillation: Secondary | ICD-10-CM

## 2023-08-21 DIAGNOSIS — I739 Peripheral vascular disease, unspecified: Secondary | ICD-10-CM | POA: Diagnosis not present

## 2023-08-21 DIAGNOSIS — I493 Ventricular premature depolarization: Secondary | ICD-10-CM | POA: Diagnosis not present

## 2023-08-21 DIAGNOSIS — I5020 Unspecified systolic (congestive) heart failure: Secondary | ICD-10-CM

## 2023-08-21 DIAGNOSIS — R0602 Shortness of breath: Secondary | ICD-10-CM

## 2023-08-21 DIAGNOSIS — I10 Essential (primary) hypertension: Secondary | ICD-10-CM | POA: Diagnosis not present

## 2023-08-21 DIAGNOSIS — J432 Centrilobular emphysema: Secondary | ICD-10-CM | POA: Diagnosis not present

## 2023-08-21 DIAGNOSIS — E782 Mixed hyperlipidemia: Secondary | ICD-10-CM

## 2023-08-21 DIAGNOSIS — R7303 Prediabetes: Secondary | ICD-10-CM

## 2023-08-21 DIAGNOSIS — I420 Dilated cardiomyopathy: Secondary | ICD-10-CM

## 2023-08-21 DIAGNOSIS — Z760 Encounter for issue of repeat prescription: Secondary | ICD-10-CM | POA: Diagnosis not present

## 2023-08-21 DIAGNOSIS — I6521 Occlusion and stenosis of right carotid artery: Secondary | ICD-10-CM

## 2023-08-21 MED ORDER — AMIODARONE HCL 200 MG PO TABS: 200.0000 mg | ORAL_TABLET | Freq: Two times a day (BID) | ORAL | Status: DC

## 2023-08-21 MED ORDER — FUROSEMIDE 40 MG PO TABS: 40.0000 mg | ORAL_TABLET | Freq: Every day | ORAL | Status: DC

## 2023-08-21 MED ORDER — METOPROLOL SUCCINATE ER 50 MG PO TB24: 100.0000 mg | ORAL_TABLET | Freq: Two times a day (BID) | ORAL | 1 refills | Status: DC

## 2023-08-21 MED ORDER — FUROSEMIDE 40 MG PO TABS: 40.0000 mg | ORAL_TABLET | Freq: Every day | ORAL | 3 refills | Status: DC

## 2023-08-21 MED ORDER — AMIODARONE HCL 400 MG PO TABS: 400.0000 mg | ORAL_TABLET | Freq: Two times a day (BID) | ORAL | Status: DC

## 2023-08-21 MED ORDER — POTASSIUM CHLORIDE ER 10 MEQ PO TBCR: 10.0000 meq | EXTENDED_RELEASE_TABLET | Freq: Every day | ORAL | 3 refills | Status: DC

## 2023-08-21 NOTE — Patient Instructions (Addendum)
 Medication Instructions:  Please start lasix/furosemide daily  Please start Potassium 10 Meq daily  Please increase the metoprolol succinate up to 100 mg twice a day  Please start amiodarone 400 mg twice a day for 7 days Then down to amiodarone 200 mg twice a day  If you need a refill on your cardiac medications before your next appointment, please call your pharmacy.   Lab work: No new labs needed  Testing/Procedures: Heart Monitor:  Your physician has requested you wear a ZIO monitor for 14  days.  Your monitor will be mailed to your home address within 3-5 business days. This is sent via [employer] from [employer]. However, if you have not received your monitor after 5 business days please send [place] a MyChart message or call the office at [phone], so we may follow up on this for you.   This monitor is a medical device (single patch monitor) that records the heart's electrical activity. Doctors most often use these monitors to diagnose arrhythmias. Arrhythmias are problems with the speed or rhythm of the heartbeat.   [employer] supplies 1 patch per enrollment. Additional stickers are not available.  Please DO NOT apply the patch if you will be having a Nuclear Stress Test, Echocardiogram, Cardiac CT, Cardiac MRI, Chest X-ray during the period you would be wearing the monitor. The patch cannot be worn during these tests.  You cannot remove and re-apply the ZIO patch monitor.   Applying the Monitor: Once you receive your monitor, this will include a small razor, abrader, and 4 alcohol pads. Shave hair from upper left chest Rub abrader disc in 40 strokes over the left upper chest as indicated in your monitor instructions Clean area with 4 enclosed alcohol pads (there may be a mild & brief stinging sensation over the newly abraded area, but this is normal). Let dry Apply patch as indicated in monitor instructions. Patch will be placed under collarbone on the left side of the  chest with arrow pointing upward. Rub adhesive wings for 2 minutes. Remove white label marked "1". Remove the white label marked "2". Rub patch adhesive wings for an 2 minutes.  While looking in a mirror, press and release button in the center of the patch. You may hear a "click". A small green light will flash 4-6 times and then stop. This will be your indicator that the monitor has been turned on.  Wearing the Monitor: Avoid showering during the first 24 hours of wearing the monitor.  After 24 hours you may shower with the patch on. Take brief showers with your back facing the shower head.  Avoid excessive sweating to help maximize wear time. Do not submerge the device, no hot tubs, and no swimming pools. Keep any lotions or oils away from the patch. Press the button if you feel a symptom. You will hear a small click. Record date, time, and symptoms in the Patient Logbook or App.  Monitor Issues: Call [employer] Customer Care at [phone] if you have questions regarding your Zio Patch Monitor. Call them immediately if you see an orange/ amber colored light blinking on your monitor. If your monitor falls off and you cannot get this reapplied or if you need suggestions for securing your monitor call [employer] at [phone].   Returning the Monitor: Once you have completed wearing your monitor, follow instructions on the last 2 pages of the Patient Logbook. Stick monitor patch on to the last page of the Patient Logbook.  Place  Patient Logbook with monitor in the return box provided. Use locking tab on box and tape box closed securely. The return box has pre-paid postage on it.  Place the return box in the regular [place] Mail box as soon as possible It will take anywhere from 1-2 weeks for your provider to receive and review your results once you mail this back. If for some reason you have misplaced your return box then call our office and we can provide another box and/or mail it  off for you.   Billing  and Patient Assistance Program Information: We have supplied [employer] with any of your insurance information on file for billing purposes. [employer] offers a sliding scale Patient Assistance Program for patients that do not have insurance, or whose insurance does not completely cover the cost of the ZIO monitor. You must apply for the Patient Assistance Program to qualify for this discounted rate. To apply, please call [employer] at [phone], select option 1, ask to apply for the Patient Assistance Program. [employer] will ask your household income, and how many people are in your household. They will quote your out-of-pocket cost based on that information. [employer] will also be able to set up for a 34-month, interest-free payment plan if needed.     Follow-Up: At [facility], you and your health needs are our priority.  As part of our continuing mission to provide you with exceptional heart care, we have created designated Provider Care Teams.  These Care Teams include your primary Cardiologist (physician) and Advanced Practice Providers (APPs -  Physician Assistants and Nurse Practitioners) who all work together to provide you with the care you need, when you need it.  You will need a follow up appointment in 1 week  Providers on your designated Care Team:   Nicolasa Ducking, NP Eula Listen, PA-C Cadence Fransico [place], [place]  COVID-19 Vaccine Information can be found at: [email or web address] For questions related to vaccine distribution or appointments, please email [email or web address] or call [phone].

## 2023-08-24 NOTE — Telephone Encounter (Signed)
 Called patient and left message for call back.

## 2023-08-27 NOTE — Telephone Encounter (Signed)
 Pt returning call

## 2023-08-27 NOTE — Telephone Encounter (Signed)
 Called and spoke with patient. Patient reports recent blood pressures and heart rates.   99/79  81 105/79  84 100/78  78 105/79  82  Patient reports that he is currently taking Metoprolol 200 MG twice daily. Patient instructed that his Metoprolol is prescribed for 100 MG twice daily. Patient reports that he has not started Amiodarone because he was feeling better and his heart rate had improved. Will forward to provider.

## 2023-08-29 NOTE — Telephone Encounter (Signed)
 Called patient and left a detailed message per DPR. Informed patient of the following from Dr. Mariah Milling and asked patient to call back with questions.  He can drop it to metoprolol 100 twice a day  Thx  TGollan

## 2023-08-30 ENCOUNTER — Telehealth: Payer: Self-pay | Admitting: Cardiovascular Disease

## 2023-08-30 NOTE — Telephone Encounter (Signed)
 Patient had to cancel his appt for 3/7 and the next available with Dr. Mariah Milling was 5/9. Patient states that is too long, stated that he want the nurse to give him a call back so that he can get work in next week. Please advise

## 2023-08-30 NOTE — Telephone Encounter (Signed)
 Appointment moved to 3/24 with Dr. Mariah Milling. Patient has been made aware.

## 2023-08-31 ENCOUNTER — Ambulatory Visit: Payer: Medicare Other | Admitting: Cardiovascular Disease

## 2023-09-04 DIAGNOSIS — I2489 Other forms of acute ischemic heart disease: Secondary | ICD-10-CM | POA: Diagnosis not present

## 2023-09-04 DIAGNOSIS — L03116 Cellulitis of left lower limb: Secondary | ICD-10-CM | POA: Diagnosis not present

## 2023-09-04 DIAGNOSIS — E871 Hypo-osmolality and hyponatremia: Secondary | ICD-10-CM | POA: Diagnosis not present

## 2023-09-04 DIAGNOSIS — E785 Hyperlipidemia, unspecified: Secondary | ICD-10-CM | POA: Diagnosis not present

## 2023-09-04 DIAGNOSIS — A419 Sepsis, unspecified organism: Secondary | ICD-10-CM | POA: Diagnosis not present

## 2023-09-04 DIAGNOSIS — I5031 Acute diastolic (congestive) heart failure: Secondary | ICD-10-CM | POA: Diagnosis not present

## 2023-09-04 DIAGNOSIS — Z79899 Other long term (current) drug therapy: Secondary | ICD-10-CM | POA: Diagnosis not present

## 2023-09-04 DIAGNOSIS — I878 Other specified disorders of veins: Secondary | ICD-10-CM | POA: Diagnosis not present

## 2023-09-04 DIAGNOSIS — I110 Hypertensive heart disease with heart failure: Secondary | ICD-10-CM | POA: Diagnosis not present

## 2023-09-04 DIAGNOSIS — M79672 Pain in left foot: Secondary | ICD-10-CM | POA: Diagnosis not present

## 2023-09-04 DIAGNOSIS — D72829 Elevated white blood cell count, unspecified: Secondary | ICD-10-CM | POA: Diagnosis not present

## 2023-09-04 DIAGNOSIS — I4891 Unspecified atrial fibrillation: Secondary | ICD-10-CM | POA: Diagnosis not present

## 2023-09-04 DIAGNOSIS — I43 Cardiomyopathy in diseases classified elsewhere: Secondary | ICD-10-CM | POA: Diagnosis not present

## 2023-09-04 DIAGNOSIS — J90 Pleural effusion, not elsewhere classified: Secondary | ICD-10-CM | POA: Diagnosis not present

## 2023-09-04 DIAGNOSIS — I509 Heart failure, unspecified: Secondary | ICD-10-CM | POA: Diagnosis not present

## 2023-09-04 DIAGNOSIS — J811 Chronic pulmonary edema: Secondary | ICD-10-CM | POA: Diagnosis not present

## 2023-09-04 DIAGNOSIS — I350 Nonrheumatic aortic (valve) stenosis: Secondary | ICD-10-CM | POA: Diagnosis not present

## 2023-09-04 DIAGNOSIS — J449 Chronic obstructive pulmonary disease, unspecified: Secondary | ICD-10-CM | POA: Diagnosis not present

## 2023-09-04 DIAGNOSIS — Z7901 Long term (current) use of anticoagulants: Secondary | ICD-10-CM | POA: Diagnosis not present

## 2023-09-04 DIAGNOSIS — I482 Chronic atrial fibrillation, unspecified: Secondary | ICD-10-CM | POA: Diagnosis not present

## 2023-09-04 DIAGNOSIS — G4733 Obstructive sleep apnea (adult) (pediatric): Secondary | ICD-10-CM | POA: Diagnosis not present

## 2023-09-04 DIAGNOSIS — I5023 Acute on chronic systolic (congestive) heart failure: Secondary | ICD-10-CM | POA: Diagnosis not present

## 2023-09-04 DIAGNOSIS — R918 Other nonspecific abnormal finding of lung field: Secondary | ICD-10-CM | POA: Diagnosis not present

## 2023-09-04 DIAGNOSIS — Z7984 Long term (current) use of oral hypoglycemic drugs: Secondary | ICD-10-CM | POA: Diagnosis not present

## 2023-09-04 DIAGNOSIS — R600 Localized edema: Secondary | ICD-10-CM | POA: Diagnosis not present

## 2023-09-04 DIAGNOSIS — Z792 Long term (current) use of antibiotics: Secondary | ICD-10-CM | POA: Diagnosis not present

## 2023-09-04 DIAGNOSIS — I5020 Unspecified systolic (congestive) heart failure: Secondary | ICD-10-CM | POA: Diagnosis not present

## 2023-09-04 DIAGNOSIS — I6529 Occlusion and stenosis of unspecified carotid artery: Secondary | ICD-10-CM | POA: Diagnosis not present

## 2023-09-04 DIAGNOSIS — R7989 Other specified abnormal findings of blood chemistry: Secondary | ICD-10-CM | POA: Diagnosis not present

## 2023-09-04 DIAGNOSIS — M7989 Other specified soft tissue disorders: Secondary | ICD-10-CM | POA: Diagnosis not present

## 2023-09-04 DIAGNOSIS — I739 Peripheral vascular disease, unspecified: Secondary | ICD-10-CM | POA: Diagnosis not present

## 2023-09-04 DIAGNOSIS — L97929 Non-pressure chronic ulcer of unspecified part of left lower leg with unspecified severity: Secondary | ICD-10-CM | POA: Diagnosis not present

## 2023-09-13 ENCOUNTER — Other Ambulatory Visit: Payer: Self-pay | Admitting: Nurse Practitioner

## 2023-09-13 DIAGNOSIS — R7303 Prediabetes: Secondary | ICD-10-CM

## 2023-09-13 DIAGNOSIS — I5020 Unspecified systolic (congestive) heart failure: Secondary | ICD-10-CM

## 2023-09-17 ENCOUNTER — Ambulatory Visit: Admitting: Cardiovascular Disease

## 2023-09-17 ENCOUNTER — Telehealth: Payer: Self-pay | Admitting: Nurse Practitioner

## 2023-09-17 NOTE — Telephone Encounter (Signed)
Lvm to schedule hospital follow up-Toni ?

## 2023-09-28 ENCOUNTER — Other Ambulatory Visit: Payer: Self-pay | Admitting: Nurse Practitioner

## 2023-09-28 DIAGNOSIS — R7303 Prediabetes: Secondary | ICD-10-CM

## 2023-09-28 DIAGNOSIS — I5020 Unspecified systolic (congestive) heart failure: Secondary | ICD-10-CM

## 2023-10-04 ENCOUNTER — Ambulatory Visit (INDEPENDENT_AMBULATORY_CARE_PROVIDER_SITE_OTHER): Admitting: Nurse Practitioner

## 2023-10-04 ENCOUNTER — Encounter: Payer: Self-pay | Admitting: Nurse Practitioner

## 2023-10-04 VITALS — BP 135/88 | HR 68 | Temp 98.3°F | Resp 16 | Ht 73.0 in | Wt 239.0 lb

## 2023-10-04 DIAGNOSIS — L03116 Cellulitis of left lower limb: Secondary | ICD-10-CM

## 2023-10-04 DIAGNOSIS — I4891 Unspecified atrial fibrillation: Secondary | ICD-10-CM | POA: Diagnosis not present

## 2023-10-04 DIAGNOSIS — Z09 Encounter for follow-up examination after completed treatment for conditions other than malignant neoplasm: Secondary | ICD-10-CM

## 2023-10-04 DIAGNOSIS — I5023 Acute on chronic systolic (congestive) heart failure: Secondary | ICD-10-CM

## 2023-10-04 MED ORDER — ATORVASTATIN CALCIUM 20 MG PO TABS: 20.0000 mg | ORAL_TABLET | Freq: Every day | ORAL | 5 refills | Status: DC

## 2023-10-04 MED ORDER — MUPIROCIN 2 % EX OINT: 1.0000 | TOPICAL_OINTMENT | Freq: Two times a day (BID) | CUTANEOUS | 5 refills | Status: DC

## 2023-10-04 MED ORDER — AMIODARONE HCL 200 MG PO TABS
200.0000 mg | ORAL_TABLET | Freq: Every day | ORAL | 11 refills | Status: DC
Start: 2023-10-04 — End: 2024-02-13

## 2023-10-04 MED ORDER — TORSEMIDE 20 MG PO TABS: 40.0000 mg | ORAL_TABLET | Freq: Every day | ORAL | 1 refills | Status: DC

## 2023-10-04 MED ORDER — EMPAGLIFLOZIN 10 MG PO TABS: 10.0000 mg | ORAL_TABLET | Freq: Every day | ORAL | 0 refills | Status: DC

## 2023-10-04 NOTE — Progress Notes (Signed)
 [facility] [place], [place] [address] [place] [address] [phone]                                   Transitional Care Clinic   [facility] Discharge Acute Issues Care Follow Up                                                                        Patient Demographics  Casey Arias, is a 78 y.o. male  DOB [birth year]  MRN [id number].  Primary MD  [name], NP  Admit date: 09/04/23 Discharge date: 09/14/23  Reason for TCC follow Up - Cellulitis of left lower leg, AFIB w/RVR, acute on chronic CHF   Past Medical History:  Diagnosis Date   Atrial fibrillation (HCC)    COPD (chronic obstructive pulmonary disease) (HCC)    Dilated cardiomyopathy (HCC)    Heart murmur    HFrEF (heart failure with reduced ejection fraction) (HCC)    Hyperlipidemia    Hypertension    PAD (peripheral artery disease) (HCC)    PVC (premature ventricular contraction)     Past Surgical History:  Procedure Laterality Date   HERNIA REPAIR     HOLEP-LASER ENUCLEATION OF THE PROSTATE WITH MORCELLATION N/A 01/05/2020   Procedure: HOLEP-LASER ENUCLEATION OF THE PROSTATE WITH MORCELLATION;  Surgeon: Vanna Scotland, MD;  Location: [facility] ORS;  Service: Urology;  Laterality: N/A;   KNEE ARTHROSCOPY Left    LEG SURGERY Left    MANDIBLE SURGERY     TONSILLECTOMY         Recent HPI and Hospital Course  Hospital Course:  Casey Arias is a 78 year old man with history of chronic afib on Xarelto, chronic HFrEF, HLD, and carotid stenosis who presented with left lower extremity cellulitis, found to be in afib with RVR and decompensated HFrEF, EF 40%-->20%.   Atrial fibrillation with RVR  Chronic afib Recently noted as outpatient to be in RVR and instructed to increase metoprolol and add amiodarone, pt non-compliant, HR 130s on admission. He was seen in consultation with cardiology and amiodarone loaded, transition to amiodarone 200 mg daily. He converted to  sinus rhythm, bradycardia 3/19. Metoprolol was reduced from 200 mg to 100 mg daily. Nocturnal desaturations were noted and OSA is felt to be contributing, sleep study is recommended, however patient reports he is not interested in CPAP. Rate was controlled and rhythm sinus at discharge. He was continued on his home Xarelto.  Acute on chronic HFrEF Prior TTE (2022) with EF 40-45%, G2DD, mild AS. Per outpatient Cardiology notes, has previously declined ischemic workup. Presented with acute decompensation of CHF with peripheral and pulmonary edema, proBNP 16,000, suspected to be due to to A-fib with RVR and nonadherence with diuretics. 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 Per cardiology, he was initiated on GDMT with Jardiance, spironolactone, Entresto, metoprolol. He developed hypotension and was placed on midodrine, Entresto held. He was diuresed with IV Lasix, with adequate response to 80 mg IV twice daily and metolazone. He  was euvolemic at discharge at a weight of 233 pounds with rise in creatinine, transition to torsemide 40 mg daily. Electrolytes were monitored closely and repleted, discharged on 20 mill equivalents of potassium daily, spironolactone, Jardiance, metoprolol.  LLE cellulitis  Sepsis, POA Patient initially presented to hospital due to diffuse LLE edema with superimposed purulent drainage and open wound consistent with cellulitis, likely superimposed on venous stasis and edema from decompensated CHF. Blood cultures were negative. He was initially treated with IV vancomycin, transition to oral doxycycline with continued improvement. He was seen by wound care with dressing was recommended, however patient refused these.  Elevated hsTroponin due to demand ischemia hsTrop mildly elevated 95 and stable  on recheck x 2. EKG without acute ischemic changes. No chest pain. Most likely due to demand ischemia in setting of afib with RVR and decompensated CHF.  Carotid stenosis  HLD Aspirin discontinued as he is on a DOAC. Statin was continued.   Post Hospital Acute Care Issue to be followed in the Clinic   Principal Problem: Atrial fibrillation with rapid ventricular response (CMS-HCC) (POA: Yes) Active Problems: Acute cellulitis (POA: Yes) Chronic atrial fibrillation (CMS-HCC) (POA: Yes) Elevated troponin (POA: Yes) Acute on chronic systolic congestive heart failure (CMS-HCC) (POA: Yes) Primary hypertension (POA: Yes) HLD (hyperlipidemia) (POA: Yes) Resolved Problems: * No resolved hospital problems. *    Subjective:   Casey Arias today has, No headache, No chest pain, No abdominal pain - No Nausea, No new weakness tingling or numbness, No Cough - SOB.   Assessment & Plan   1. Hospital discharge follow-up (Primary) Treated with intravenous antibiotics and AFIB treated, returned to sinus rhythm during his hospital stay.   2. Cellulitis of left lower extremity Discharged on doxycycline which he has finished. Continue with topical mupirocin until area is healed as prescribed.   3. Atrial fibrillation with RVR (HCC) Continue amiodarone as prescribed. Follow up with cardiology as scheduled.   4. Acute on chronic systolic CHF (congestive heart failure) (HCC) Continue torsemide, jardiance, atorvastatin, and spironolactone as prescribed.     Reason for frequent admissions/ER visits    afib CHF Cellulitis    Objective:   Vitals:   10/04/23 1415  BP: 135/88  Pulse: 68  Resp: 16  Temp: 98.3 F (36.8 C)  SpO2: 97%  Weight: 239 lb (108.4 kg)  Height: 6\' 1"  (1.854 m)    Wt Readings from Last 3 Encounters:  10/04/23 239 lb (108.4 kg)  08/21/23 265 lb 8 oz (120.4 kg)  06/05/23 260 lb (117.9 kg)    Allergies as of 10/04/2023       Reactions   Bee Pollen  Anaphylaxis   Bee stings   Nitrofurantoin Diarrhea   Nitrofurantoin Monohyd Macro Diarrhea   Ciprofloxacin Rash        Medication List        Accurate as of October 04, 2023  4:07 PM. If you have any questions, ask your nurse or doctor.          STOP taking these medications    ezetimibe 10 MG tablet Commonly known as: ZETIA Stopped by: [name]   furosemide 40 MG tablet Commonly known as: LASIX Stopped by: Sabino Denning   Guaifenesin 200 MG/5ML Liqd Stopped by: Lilana Blasko   potassium chloride SA 20 MEQ tablet Commonly known as: KLOR-CON M Stopped by: [name]       TAKE these medications    albuterol 108 (90 Base) MCG/ACT inhaler Commonly known as: Ventolin  HFA Inhale 2 puffs every 4 to 6 hrs as needed for sob   amiodarone 200 MG tablet Commonly known as: PACERONE Take 1 tablet (200 mg total) by mouth daily. What changed: Another medication with the same name was removed. Continue taking this medication, and follow the directions you see here. Changed by: [name]   atorvastatin 20 MG tablet Commonly known as: LIPITOR Take 1 tablet (20 mg total) by mouth daily. What changed: Another medication with the same name was removed. Continue taking this medication, and follow the directions you see here. Changed by: [name]   chlorhexidine 0.12 % solution Commonly known as: PERIDEX USE AS DIRECTED 15 ML IN THE MOUTH OR THROAT TWICE A DAY *SPIT, DO NOT SWALLOW*   chlorpheniramine-HYDROcodone 10-8 MG/5ML Commonly known as: TUSSIONEX Take 5 mLs by mouth every 12 (twelve) hours as needed for cough.   diclofenac Sodium 1 % Gel Commonly known as: VOLTAREN Apply 2 g topically daily.   doxycycline 100 MG tablet Commonly known as: VIBRA-TABS Take 100 mg by mouth 2 (two) times daily.   empagliflozin 10 MG Tabs tablet Commonly known as: JARDIANCE Take 1 tablet (10 mg total) by mouth daily.   EPINEPHrine 0.3 mg/0.3 mL  Soaj injection Commonly known as: EpiPen 2-Pak Use as directed  as needed for anaphylaxis   metoprolol succinate 50 MG 24 hr tablet Commonly known as: TOPROL-XL Take 2 tablets (100 mg total) by mouth 2 (two) times daily.   multivitamin with minerals tablet Take 1 tablet by mouth daily.   mupirocin ointment 2 % Commonly known as: BACTROBAN Apply 1 Application topically 2 (two) times daily. To left foot and lower leg until healed. Started by: [name]   potassium chloride 10 MEQ tablet Commonly known as: KLOR-CON Take 1 tablet (10 mEq total) by mouth daily.   rivaroxaban 20 MG Tabs tablet Commonly known as: Xarelto Take 1 tablet (20 mg total) by mouth daily with supper.   spironolactone 25 MG tablet Commonly known as: ALDACTONE Take 1 tablet by mouth daily.   torsemide 20 MG tablet Commonly known as: DEMADEX Take 2 tablets (40 mg total) by mouth daily.   triamcinolone cream 0.1 % Commonly known as: KENALOG APPLY 1 APPLICATION TOPICALLY 2 TIMES DAILY TO THE AFFECTED AREA AS NEEDED (IRRITATION)         Physical Exam: Constitutional: Patient appears well-developed and well-nourished. Not in obvious distress. HENT: Normocephalic, atraumatic, External right and left ear normal. Oropharynx is clear and moist.  Eyes: Conjunctivae and EOM are normal. PERRLA, no scleral icterus. Neck: Normal ROM. Neck supple. No JVD. No tracheal deviation. No thyromegaly. CVS: RRR, S1/S2 +, no murmurs, no gallops, no carotid bruit.  Pulmonary: Effort and breath sounds normal, no stridor, rhonchi, wheezes, rales.  Abdominal: Soft. BS +, no distension, tenderness, rebound or guarding.  Musculoskeletal: Normal range of motion. No edema and no tenderness.  Lymphadenopathy: No lymphadenopathy noted, cervical, inguinal or axillary Neuro: Alert. Normal reflexes, muscle tone coordination. No cranial nerve deficit. Skin: Skin is warm and dry. No rash noted. Not diaphoretic. No erythema. No  pallor. Psychiatric: Normal mood and affect. Behavior, judgment, thought content normal.   Data Review   Micro Results No results found for this or any previous visit (from the past 240 hours).   CBC No results for input(s): "WBC", "HGB", "HCT", "PLT", "MCV", "MCH", "MCHC", "RDW", "LYMPHSABS", "MONOABS", "EOSABS", "BASOSABS", "BANDABS" in the last 168 hours.  Invalid input(s): "NEUTRABS", "BANDSABD"  Chemistries  No results  for input(s): "NA", "K", "CL", "CO2", "GLUCOSE", "BUN", "CREATININE", "CALCIUM", "MG", "AST", "ALT", "ALKPHOS", "BILITOT" in the last 168 hours.  Invalid input(s): "GFRCGP" ------------------------------------------------------------------------------------------------------------------ CrCl cannot be calculated (Patient's most recent lab result is older than the maximum 21 days allowed.). ------------------------------------------------------------------------------------------------------------------ No results for input(s): "HGBA1C" in the last 72 hours. ------------------------------------------------------------------------------------------------------------------ No results for input(s): "CHOL", "HDL", "LDLCALC", "TRIG", "CHOLHDL", "LDLDIRECT" in the last 72 hours. ------------------------------------------------------------------------------------------------------------------ No results for input(s): "TSH", "T4TOTAL", "T3FREE", "THYROIDAB" in the last 72 hours.  Invalid input(s): "FREET3" ------------------------------------------------------------------------------------------------------------------ No results for input(s): "VITAMINB12", "FOLATE", "FERRITIN", "TIBC", "IRON", "RETICCTPCT" in the last 72 hours.  Coagulation profile No results for input(s): "INR", "PROTIME" in the last 168 hours.  No results for input(s): "DDIMER" in the last 72 hours.  Cardiac Enzymes No results for input(s): "CKMB", "TROPONINI", "MYOGLOBIN" in the last 168  hours.  Invalid input(s): "CK" ------------------------------------------------------------------------------------------------------------------ Invalid input(s): "POCBNP"  Return in about 2 months (around 12/04/2023) for F/U, Anthany Thornhill PCP.   Time Spent in minutes  45 Time spent with patient included reviewing progress notes, labs, imaging studies, and discussing plan for follow up.   This patient was seen by [name], FNP-C in collaboration with Dr. Beverely Risen as a part of collaborative care agreement.    [name] MSN, FNP-C on 10/04/2023 at 4:07 PM   **Disclaimer: This note may have been dictated with voice recognition software. Similar sounding words can inadvertently be transcribed and this note may contain transcription errors which may not have been corrected upon publication of note.**

## 2023-10-17 ENCOUNTER — Ambulatory Visit: Payer: Medicare HMO | Admitting: Nurse Practitioner

## 2023-10-18 DIAGNOSIS — I5020 Unspecified systolic (congestive) heart failure: Secondary | ICD-10-CM | POA: Diagnosis not present

## 2023-10-18 DIAGNOSIS — I4891 Unspecified atrial fibrillation: Secondary | ICD-10-CM | POA: Diagnosis not present

## 2023-10-18 DIAGNOSIS — E785 Hyperlipidemia, unspecified: Secondary | ICD-10-CM | POA: Diagnosis not present

## 2023-10-18 DIAGNOSIS — I4819 Other persistent atrial fibrillation: Secondary | ICD-10-CM | POA: Diagnosis not present

## 2023-10-18 DIAGNOSIS — I10 Essential (primary) hypertension: Secondary | ICD-10-CM | POA: Diagnosis not present

## 2023-11-11 NOTE — Progress Notes (Deleted)
 NO SHOW

## 2023-11-12 ENCOUNTER — Ambulatory Visit: Attending: Cardiovascular Disease | Admitting: Cardiovascular Disease

## 2023-11-12 ENCOUNTER — Other Ambulatory Visit: Payer: Self-pay | Admitting: Nurse Practitioner

## 2023-11-12 DIAGNOSIS — I4891 Unspecified atrial fibrillation: Secondary | ICD-10-CM

## 2023-11-12 DIAGNOSIS — R0602 Shortness of breath: Secondary | ICD-10-CM

## 2023-11-12 DIAGNOSIS — I5020 Unspecified systolic (congestive) heart failure: Secondary | ICD-10-CM

## 2023-11-12 DIAGNOSIS — I420 Dilated cardiomyopathy: Secondary | ICD-10-CM

## 2023-11-12 DIAGNOSIS — I739 Peripheral vascular disease, unspecified: Secondary | ICD-10-CM

## 2023-11-12 DIAGNOSIS — J432 Centrilobular emphysema: Secondary | ICD-10-CM

## 2023-11-12 DIAGNOSIS — E782 Mixed hyperlipidemia: Secondary | ICD-10-CM

## 2023-12-03 ENCOUNTER — Ambulatory Visit (INDEPENDENT_AMBULATORY_CARE_PROVIDER_SITE_OTHER): Admitting: Nurse Practitioner

## 2023-12-03 ENCOUNTER — Encounter: Payer: Self-pay | Admitting: Nurse Practitioner

## 2023-12-03 VITALS — BP 125/73 | HR 60 | Temp 98.2°F | Resp 16 | Ht 73.0 in | Wt 239.8 lb

## 2023-12-03 DIAGNOSIS — E785 Hyperlipidemia, unspecified: Secondary | ICD-10-CM

## 2023-12-03 DIAGNOSIS — Z125 Encounter for screening for malignant neoplasm of prostate: Secondary | ICD-10-CM | POA: Diagnosis not present

## 2023-12-03 DIAGNOSIS — B3789 Other sites of candidiasis: Secondary | ICD-10-CM | POA: Diagnosis not present

## 2023-12-03 DIAGNOSIS — I5020 Unspecified systolic (congestive) heart failure: Secondary | ICD-10-CM | POA: Diagnosis not present

## 2023-12-03 DIAGNOSIS — E1169 Type 2 diabetes mellitus with other specified complication: Secondary | ICD-10-CM

## 2023-12-03 LAB — POCT GLYCOSYLATED HEMOGLOBIN (HGB A1C): Hemoglobin A1C: 6.1 % — AB (ref 4.0–5.6)

## 2023-12-03 MED ORDER — EMPAGLIFLOZIN 10 MG PO TABS: 10.0000 mg | ORAL_TABLET | Freq: Every day | ORAL | 5 refills | Status: DC

## 2023-12-03 MED ORDER — NYSTATIN 100000 UNIT/GM EX CREA: 1.0000 | TOPICAL_CREAM | Freq: Two times a day (BID) | CUTANEOUS | 1 refills | Status: AC

## 2023-12-03 NOTE — Progress Notes (Signed)
 [facility] [address] [place], [place] [address]  Internal MEDICINE  Office Visit Note  Patient Name: Casey Arias  [id number]  [id number]  Date of Service: 12/03/2023  Chief Complaint  Patient presents with   Hypertension   Hyperlipidemia   Follow-up    HPI Casey Arias presents for a follow-up visit for rash, heart failure and diabetes.  Rash of groin -- red, not itchy  Has been feeling better since being in the hospital, no issues with the left leg or issues with AFIB. Also reports he is getting a lot of exercise.  Last A1c was just above in diabetic range at 6.5 in October last year. -- he is on jardiance  for heart failure which is most likely also helping his glucose levels. A1c improved to 6.1 today.     Current Medication: Outpatient Encounter Medications as of 12/03/2023  Medication Sig   albuterol  (VENTOLIN  HFA) 108 (90 Base) MCG/ACT inhaler Inhale 2 puffs every 4 to 6 hrs as needed for sob   amiodarone  (PACERONE ) 200 MG tablet Take 1 tablet (200 mg total) by mouth daily.   chlorhexidine  (PERIDEX ) 0.12 % solution USE AS DIRECTED 15 ML IN THE MOUTH OR THROAT TWICE A DAY *SPIT, DO NOT SWALLOW*   diclofenac  Sodium (VOLTAREN ) 1 % GEL Apply 2 g topically daily.   doxycycline (VIBRA-TABS) 100 MG tablet Take 100 mg by mouth 2 (two) times daily.   EPINEPHrine  (EPIPEN  2-PAK) 0.3 mg/0.3 mL IJ SOAJ injection Use as directed  as needed for anaphylaxis   metoprolol  succinate (TOPROL -XL) 50 MG 24 hr tablet Take 2 tablets (100 mg total) by mouth 2 (two) times daily.   Multiple Vitamins-Minerals (MULTIVITAMIN WITH MINERALS) tablet Take 1 tablet by mouth daily.   mupirocin  ointment (BACTROBAN ) 2 % Apply 1 Application topically 2 (two) times daily. To left foot and lower leg until healed.   nystatin  cream (MYCOSTATIN ) Apply 1 Application topically 2 (two) times daily. To affect area on left thigh/groin until resolved.   rivaroxaban  (XARELTO ) 20 MG TABS tablet Take 1  tablet (20 mg total) by mouth daily with supper.   triamcinolone  cream (KENALOG ) 0.1 % APPLY 1 APPLICATION TOPICALLY 2 TIMES DAILY TO THE AFFECTED AREA AS NEEDED (IRRITATION)   [DISCONTINUED] chlorpheniramine-HYDROcodone  (TUSSIONEX) 10-8 MG/5ML Take 5 mLs by mouth every 12 (twelve) hours as needed for cough.   [DISCONTINUED] empagliflozin  (JARDIANCE ) 10 MG TABS tablet TAKE 1 TABLET BY MOUTH EVERY DAY   atorvastatin  (LIPITOR) 20 MG tablet Take 1 tablet (20 mg total) by mouth daily.   empagliflozin  (JARDIANCE ) 10 MG TABS tablet Take 1 tablet (10 mg total) by mouth daily.   potassium chloride  (KLOR-CON ) 10 MEQ tablet Take 1 tablet (10 mEq total) by mouth daily.   spironolactone (ALDACTONE) 25 MG tablet Take 1 tablet by mouth daily.   [DISCONTINUED] torsemide  (DEMADEX ) 20 MG tablet Take 2 tablets (40 mg total) by mouth daily.   No facility-administered encounter medications on file as of 12/03/2023.    Surgical History: Past Surgical History:  Procedure Laterality Date   HERNIA REPAIR     HOLEP-LASER ENUCLEATION OF THE PROSTATE WITH MORCELLATION N/A 01/05/2020   Procedure: HOLEP-LASER ENUCLEATION OF THE PROSTATE WITH MORCELLATION;  Surgeon: Dustin Gimenez, MD;  Location: [facility] ORS;  Service: Urology;  Laterality: N/A;   KNEE ARTHROSCOPY Left    LEG SURGERY Left    MANDIBLE SURGERY     TONSILLECTOMY      Medical History: Past Medical History:  Diagnosis Date  Atrial fibrillation (HCC)    COPD (chronic obstructive pulmonary disease) (HCC)    Dilated cardiomyopathy (HCC)    Heart murmur    HFrEF (heart failure with reduced ejection fraction) (HCC)    Hyperlipidemia    Hypertension    PAD (peripheral artery disease) (HCC)    PVC (premature ventricular contraction)     Family History: Family History  Problem Relation Age of Onset   Bladder Cancer Mother    Lymphoma Father    Prostate cancer Paternal Grandfather    Kidney cancer Neg Hx     Social History   Socioeconomic History    Marital status: Married    Spouse name: Product/process development scientist   Number of children: Not on file   Years of education: Not on file   Highest education level: Not on file  Occupational History   Not on file  Tobacco Use   Smoking status: Former    Types: Cigarettes   Smokeless tobacco: Never   Tobacco comments:    quit 14yrs ago  Vaping Use   Vaping status: Never Used  Substance and Sexual Activity   Alcohol use: Never   Drug use: Never   Sexual activity: Not on file  Other Topics Concern   Not on file  Social History Narrative   retired and lives at home with his wife   Was a Engineer, 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   Social Drivers of Corporate investment banker Strain: Low Risk  (12/05/2023)   Received from [facility]   Overall Financial Resource Strain (CARDIA)    How hard is it for you to pay for the very basics like food, housing, medical care, and heating?: Not hard at all  Food Insecurity: No Food Insecurity (12/05/2023)   Received from [facility]   Hunger Vital Sign    Within the past 12 months, you worried that your food would run out before you got the money to buy more.: Never true    Within the past 12 months, the food you bought just didn't last and you didn't have money to get more.: Never true  Transportation Needs: No Transportation Needs (12/05/2023)   Received from [facility] - Transportation    In the past 12 months, has lack of transportation kept you from medical appointments or from getting medications?: No    In the past 12 months, has lack of transportation kept you from meetings, work, or from getting things needed for daily living?: No  Physical Activity: Insufficiently Active (12/05/2023)   Received from [facility]   Exercise Vital Sign    On average, how many days per week do you engage in moderate to strenuous exercise (like a brisk walk)?: 3 days    On average, how many minutes do you engage in exercise at this level?: 30 min  Stress: No Stress Concern  Present (12/05/2023)   Received from [facility] - Occupational Stress Questionnaire    Do you feel stress - tense, restless, nervous, or anxious, or unable to sleep at night because your mind is troubled all the time - these days?: Not at all  Social Connections: Patient Declined (12/05/2023)   Received from [facility]   Social Connection and Isolation Panel    In a typical week, how many times do you talk on the phone with family, friends, or neighbors?: Patient declined    How often do you get  together with friends or relatives?: Patient declined    How often do you attend church or religious services?: Patient declined    Do you belong to any clubs or organizations such as church groups, unions, fraternal or athletic groups, or school groups?: Patient declined    How often do you attend meetings of the clubs or organizations you belong to?: Patient declined    Are you married, widowed, divorced, separated, never married, or living with a partner?: Patient declined  Intimate Partner Violence: Not At Risk (12/05/2023)   Received from [facility]   Humiliation, Afraid, Rape, and Kick questionnaire    Within the last year, have you been afraid of your partner or ex-partner?: No    Within the last year, have you been humiliated or emotionally abused in other ways by your partner or ex-partner?: No    Within the last year, have you been kicked, hit, slapped, or otherwise physically hurt by your partner or ex-partner?: No    Within the last year, have you been raped or forced to have any kind of sexual activity by your partner or ex-partner?: No      Review of Systems  Constitutional:  Negative for chills, fatigue and unexpected weight change.  HENT:  Negative for congestion, rhinorrhea, sneezing and sore throat.   Eyes:  Negative for redness.  Respiratory: Negative.  Negative for cough, chest tightness, shortness of breath and wheezing.    Cardiovascular: Negative.  Negative for chest pain and palpitations.  Gastrointestinal:  Negative for abdominal pain, constipation, diarrhea, nausea and vomiting.  Genitourinary:  Negative for dysuria and frequency.  Musculoskeletal: Negative.  Negative for arthralgias, back pain, joint swelling, neck pain and neck stiffness.  Skin:  Positive for rash.  Neurological: Negative.  Negative for tremors and numbness.  Hematological:  Negative for adenopathy. Does not bruise/bleed easily.  Psychiatric/Behavioral:  Negative for behavioral problems (Depression), sleep disturbance and suicidal ideas. The patient is not nervous/anxious.     Vital Signs: BP 125/73   Pulse 60   Temp 98.2 F (36.8 C)   Resp 16   Ht 6' 1 (1.854 m)   Wt 239 lb 12.8 oz (108.8 kg)   SpO2 97%   BMI 31.64 kg/m    Physical Exam Vitals reviewed.  Constitutional:      General: He is not in acute distress.    Appearance: Normal appearance. He is obese. He is not ill-appearing.  HENT:     Head: Normocephalic and atraumatic.   Eyes:     Pupils: Pupils are equal, round, and reactive to light.    Cardiovascular:     Rate and Rhythm: Normal rate and regular rhythm.  Pulmonary:     Effort: Pulmonary effort is normal. No respiratory distress.     Breath sounds: Normal breath sounds. No wheezing.   Skin:    General: Skin is warm and dry.     Capillary Refill: Capillary refill takes less than 2 seconds.     Findings: Rash (left thigh, left groin) present.   Neurological:     Mental Status: He is alert and oriented to person, place, and time.   Psychiatric:        Mood and Affect: Mood normal.        Behavior: Behavior normal.        Assessment/Plan: 1. Type 2 diabetes mellitus with other specified complication, without long-term current use of insulin (HCC) (Primary) A1c is stable at 6.1. Continue jardiance  as prescribed.  Routine labs ordered.  - empagliflozin  (JARDIANCE ) 10 MG TABS tablet; Take 1  tablet (10 mg total) by mouth daily.  Dispense: 30 tablet; Refill: 5 - POCT glycosylated hemoglobin (Hb A1C) - CBC with Differential/Platelet - CMP14+EGFR - Lipid Profile - Hgb A1C w/o eAG - PSA Total (Reflex To Free)  2. HFrEF (heart failure with reduced ejection fraction) (HCC) Continue jardiance  as prescribed. Routine labs ordered.  - empagliflozin  (JARDIANCE ) 10 MG TABS tablet; Take 1 tablet (10 mg total) by mouth daily.  Dispense: 30 tablet; Refill: 5 - CBC with Differential/Platelet - CMP14+EGFR - Lipid Profile - Hgb A1C w/o eAG - PSA Total (Reflex To Free)  3. Hyperlipidemia associated with type 2 diabetes mellitus (HCC) Routine labs ordered  - CBC with Differential/Platelet - CMP14+EGFR - Lipid Profile  4. Candida rash of groin Topical nystatin  cream ordered, used as prescribed.  - nystatin  cream (MYCOSTATIN ); Apply 1 Application topically 2 (two) times daily. To affect area on left thigh/groin until resolved.  Dispense: 30 g; Refill: 1  5. Screening for prostate cancer Routine lab ordered  - PSA Total (Reflex To Free)   General Counseling: Casey Arias understanding of the findings of todays visit and agrees with plan of treatment. I have discussed any further diagnostic evaluation that may be needed or ordered today. We also reviewed his medications today. he has been encouraged to call the office with any questions or concerns that should arise related to todays visit.    Orders Placed This Encounter  Procedures   CBC with Differential/Platelet   CMP14+EGFR   Lipid Profile   Hgb A1C w/o eAG   PSA Total (Reflex To Free)   POCT glycosylated hemoglobin (Hb A1C)    Meds ordered this encounter  Medications   empagliflozin  (JARDIANCE ) 10 MG TABS tablet    Sig: Take 1 tablet (10 mg total) by mouth daily.    Dispense:  30 tablet    Refill:  5   nystatin  cream (MYCOSTATIN )    Sig: Apply 1 Application topically 2 (two) times daily. To affect area on left  thigh/groin until resolved.    Dispense:  30 g    Refill:  1    Return for previously scheduled, AWV, Casey Arias PCP in september. .   Total time spent:30 Minutes Time spent includes review of chart, medications, test results, and follow up plan with the patient.   [place] Controlled Substance Database was reviewed by me.  This patient was seen by Laurence Pons, FNP-C in collaboration with Dr. Verneta Gone as a part of collaborative care agreement.   Gauri Galvao R. Bobbi Burow, MSN, FNP-C Internal medicine

## 2023-12-05 ENCOUNTER — Other Ambulatory Visit: Payer: Self-pay | Admitting: Nurse Practitioner

## 2023-12-07 ENCOUNTER — Encounter: Payer: Self-pay | Admitting: Nurse Practitioner

## 2023-12-10 ENCOUNTER — Other Ambulatory Visit: Payer: Self-pay | Admitting: Nurse Practitioner

## 2023-12-10 DIAGNOSIS — I10 Essential (primary) hypertension: Secondary | ICD-10-CM

## 2023-12-23 ENCOUNTER — Other Ambulatory Visit: Payer: Self-pay | Admitting: Nurse Practitioner

## 2023-12-23 DIAGNOSIS — Z760 Encounter for issue of repeat prescription: Secondary | ICD-10-CM

## 2023-12-24 ENCOUNTER — Other Ambulatory Visit: Payer: Self-pay | Admitting: Cardiovascular Disease

## 2023-12-24 DIAGNOSIS — Z760 Encounter for issue of repeat prescription: Secondary | ICD-10-CM

## 2024-01-15 NOTE — Progress Notes (Signed)
   01/15/2024  Patient ID: Casey Arias, male   DOB: 1946/05/23, 78 y.o.   MRN: [id number]  This patient is appearing on a report for being at risk of failing the adherence measure for identified medications this calendar year.   Medication Adherence Summary (STAR/HEDIS Monitoring): Adherence Category: diabetes    Drug Name: Jardiance  10 mg  Sold Date: 12/31/2023 Days' Supply: 30      Notes: ? Adherence data pulled from pharmacy claims portal Dr. Annemarie. Hemoglobin A1c 6.1%. ? Reviewed barriers to adherence: none identified. ? Plan: Will follow up with next fill  Dorcas Solian, PharmD Clinical Pharmacist Cell: [phone]

## 2024-01-29 ENCOUNTER — Other Ambulatory Visit: Payer: Self-pay | Admitting: Nurse Practitioner

## 2024-02-04 ENCOUNTER — Telehealth: Payer: Self-pay

## 2024-02-04 NOTE — Progress Notes (Signed)
   02/04/2024  Patient ID: Casey Arias, male   DOB: 27-Nov-1945, 78 y.o.   MRN: [id number]  This patient is appearing on a report for being at risk of failing the adherence measure for identified medications this calendar year.   Medication Adherence Summary (STAR/HEDIS Monitoring): Adherence Category: diabetes    Drug Name: Jardiance  10 mg  Sold Date: 01/26/2024 Days' Supply: 30   STAR rate medication(s) not on report: Losartan  25 mg last sold 01/30/2024 for 90-days supply and atorvastatin  20 mg on 01/11/2024 for 30 days supply.     Notes: ? Adherence data pulled from pharmacy claims portal Dr. Annemarie. Hemoglobin A1c 6.1%. ? Reviewed barriers to adherence: none identified. ? Plan: Will follow up with next fill  Dorcas Solian, PharmD Clinical Pharmacist Cell: [phone]

## 2024-02-11 DIAGNOSIS — I5020 Unspecified systolic (congestive) heart failure: Secondary | ICD-10-CM | POA: Diagnosis not present

## 2024-02-11 DIAGNOSIS — E785 Hyperlipidemia, unspecified: Secondary | ICD-10-CM | POA: Diagnosis not present

## 2024-02-11 DIAGNOSIS — E1169 Type 2 diabetes mellitus with other specified complication: Secondary | ICD-10-CM | POA: Diagnosis not present

## 2024-02-12 LAB — PSA TOTAL (REFLEX TO FREE): Prostate Specific Ag, Serum: 1.1 ng/mL (ref 0.0–4.0)

## 2024-02-12 LAB — LIPID PANEL
Chol/HDL Ratio: 3.3 ratio (ref 0.0–5.0)
Cholesterol, Total: 205 mg/dL — ABNORMAL HIGH (ref 100–199)
HDL: 62 mg/dL (ref >39)
LDL Chol Calc (NIH): 131 mg/dL — ABNORMAL HIGH (ref 0–99)
Triglycerides: 65 mg/dL (ref 0–149)
VLDL Cholesterol Cal: 12 mg/dL (ref 5–40)

## 2024-02-12 LAB — CBC WITH DIFFERENTIAL/PLATELET
Basophils Absolute: 0.1 x10E3/uL (ref 0.0–0.2)
Basos: 1 %
EOS (ABSOLUTE): 0.2 x10E3/uL (ref 0.0–0.4)
Eos: 2 %
Hematocrit: 48 % (ref 37.5–51.0)
Hemoglobin: 16.1 g/dL (ref 13.0–17.7)
Immature Grans (Abs): 0 x10E3/uL (ref 0.0–0.1)
Immature Granulocytes: 0 %
Lymphocytes Absolute: 2.1 x10E3/uL (ref 0.7–3.1)
Lymphs: 23 %
MCH: 31.2 pg (ref 26.6–33.0)
MCHC: 33.5 g/dL (ref 31.5–35.7)
MCV: 93 fL (ref 79–97)
Monocytes Absolute: 0.9 x10E3/uL (ref 0.1–0.9)
Monocytes: 10 %
Neutrophils Absolute: 5.6 x10E3/uL (ref 1.4–7.0)
Neutrophils: 64 %
Platelets: 327 x10E3/uL (ref 150–450)
RBC: 5.16 x10E6/uL (ref 4.14–5.80)
RDW: 11.8 % (ref 11.6–15.4)
WBC: 8.9 x10E3/uL (ref 3.4–10.8)

## 2024-02-12 LAB — CMP14+EGFR
ALT: 19 IU/L (ref 0–44)
AST: 24 IU/L (ref 0–40)
Albumin: 4.5 g/dL (ref 3.8–4.8)
Alkaline Phosphatase: 100 IU/L (ref 44–121)
BUN/Creatinine Ratio: 13 (ref 10–24)
BUN: 14 mg/dL (ref 8–27)
Bilirubin Total: 0.7 mg/dL (ref 0.0–1.2)
CO2: 24 mmol/L (ref 20–29)
Calcium: 10.1 mg/dL (ref 8.6–10.2)
Chloride: 97 mmol/L (ref 96–106)
Creatinine, Ser: 1.08 mg/dL (ref 0.76–1.27)
Globulin, Total: 3 g/dL (ref 1.5–4.5)
Glucose: 134 mg/dL — ABNORMAL HIGH (ref 70–99)
Potassium: 4.2 mmol/L (ref 3.5–5.2)
Sodium: 138 mmol/L (ref 134–144)
Total Protein: 7.5 g/dL (ref 6.0–8.5)
eGFR: 72 mL/min/1.73 (ref >59)

## 2024-02-12 LAB — HGB A1C W/O EAG: Hgb A1c MFr Bld: 6 % — ABNORMAL HIGH (ref 4.8–5.6)

## 2024-02-13 ENCOUNTER — Other Ambulatory Visit: Payer: Self-pay | Admitting: Internal Medicine

## 2024-02-13 DIAGNOSIS — I4891 Unspecified atrial fibrillation: Secondary | ICD-10-CM

## 2024-02-13 NOTE — Telephone Encounter (Signed)
 Please review ok to fill

## 2024-02-28 ENCOUNTER — Encounter: Payer: Self-pay | Admitting: Nurse Practitioner

## 2024-02-28 ENCOUNTER — Ambulatory Visit: Payer: Medicare HMO | Admitting: Nurse Practitioner

## 2024-02-28 VITALS — BP 124/69 | HR 63 | Temp 98.3°F | Resp 16 | Ht 73.0 in | Wt 242.4 lb

## 2024-02-28 DIAGNOSIS — Z0000 Encounter for general adult medical examination without abnormal findings: Secondary | ICD-10-CM | POA: Diagnosis not present

## 2024-02-28 DIAGNOSIS — J449 Chronic obstructive pulmonary disease, unspecified: Secondary | ICD-10-CM | POA: Diagnosis not present

## 2024-02-28 DIAGNOSIS — I5020 Unspecified systolic (congestive) heart failure: Secondary | ICD-10-CM | POA: Diagnosis not present

## 2024-02-28 DIAGNOSIS — I10 Essential (primary) hypertension: Secondary | ICD-10-CM | POA: Diagnosis not present

## 2024-02-28 DIAGNOSIS — E1169 Type 2 diabetes mellitus with other specified complication: Secondary | ICD-10-CM | POA: Diagnosis not present

## 2024-02-28 DIAGNOSIS — E785 Hyperlipidemia, unspecified: Secondary | ICD-10-CM

## 2024-02-28 DIAGNOSIS — I4811 Longstanding persistent atrial fibrillation: Secondary | ICD-10-CM | POA: Diagnosis not present

## 2024-02-28 MED ORDER — ATORVASTATIN CALCIUM 40 MG PO TABS: 40.0000 mg | ORAL_TABLET | Freq: Every day | ORAL | 3 refills | Status: AC

## 2024-02-28 MED ORDER — EMPAGLIFLOZIN 10 MG PO TABS
10.0000 mg | ORAL_TABLET | Freq: Every day | ORAL | 5 refills | Status: AC
Start: 2024-02-28 — End: ?

## 2024-02-28 MED ORDER — ALBUTEROL SULFATE HFA 108 (90 BASE) MCG/ACT IN AERS: INHALATION_SPRAY | RESPIRATORY_TRACT | 5 refills | Status: AC

## 2024-02-28 MED ORDER — RIVAROXABAN 20 MG PO TABS: 20.0000 mg | ORAL_TABLET | Freq: Every day | ORAL | 11 refills | Status: AC
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
## 2024-03-03 ENCOUNTER — Encounter: Payer: Self-pay | Admitting: Nurse Practitioner

## 2024-03-03 ENCOUNTER — Telehealth: Payer: Self-pay

## 2024-03-03 NOTE — Progress Notes (Signed)
   03/03/2024  Patient ID: Casey Arias, male   DOB: 1946-01-07, 78 y.o.   MRN: [id number]  This patient is appearing on a report for being at risk of failing the adherence measure for identified medications this calendar year.   Medication Adherence Summary (STAR/HEDIS Monitoring): Adherence Category: diabetes    Drug Name: Jardiance  10 mg  Sold Date: 02/27/2024 Days' Supply: 30   STAR rate medication(s) not on report: Losartan  25 mg last sold 01/30/2024 for 90-days supply and atorvastatin  20 mg on 01/11/2024 for 30 days supply.     Notes: ? Adherence data pulled from pharmacy claims portal Dr. Annemarie. Hemoglobin A1c 6.1%. ? Reviewed barriers to adherence: none identified. ? Plan: Will follow up with next fill  Dorcas Solian, PharmD Clinical Pharmacist Cell: [phone]

## 2024-03-10 ENCOUNTER — Other Ambulatory Visit: Payer: Self-pay | Admitting: Nurse Practitioner

## 2024-03-10 DIAGNOSIS — I4811 Longstanding persistent atrial fibrillation: Secondary | ICD-10-CM | POA: Diagnosis not present

## 2024-03-10 DIAGNOSIS — R7303 Prediabetes: Secondary | ICD-10-CM

## 2024-03-10 DIAGNOSIS — I517 Cardiomegaly: Secondary | ICD-10-CM | POA: Diagnosis not present

## 2024-03-10 DIAGNOSIS — I350 Nonrheumatic aortic (valve) stenosis: Secondary | ICD-10-CM | POA: Diagnosis not present

## 2024-03-10 DIAGNOSIS — I5020 Unspecified systolic (congestive) heart failure: Secondary | ICD-10-CM

## 2024-03-10 DIAGNOSIS — I5032 Chronic diastolic (congestive) heart failure: Secondary | ICD-10-CM | POA: Diagnosis not present

## 2024-03-20 DIAGNOSIS — I5022 Chronic systolic (congestive) heart failure: Secondary | ICD-10-CM | POA: Diagnosis not present

## 2024-03-20 DIAGNOSIS — I480 Paroxysmal atrial fibrillation: Secondary | ICD-10-CM | POA: Diagnosis not present

## 2024-03-20 DIAGNOSIS — I350 Nonrheumatic aortic (valve) stenosis: Secondary | ICD-10-CM | POA: Diagnosis not present

## 2024-03-22 ENCOUNTER — Other Ambulatory Visit: Payer: Self-pay | Admitting: Nurse Practitioner

## 2024-04-12 ENCOUNTER — Other Ambulatory Visit: Payer: Self-pay | Admitting: Nurse Practitioner

## 2024-04-28 NOTE — Progress Notes (Addendum)
 Casey Arias                                          MRN: [id number]   04/28/2024   The [facility] Quality Team Specialist reviewed this patient medical record for the purposes of chart review for care gap closure. The following were reviewed: abstraction for care gap closure-kidney health evaluation for diabetes:eGFR  and uACR.   Casey Arias                                          MRN: [id number]   06/10/2024   The [facility] Quality Team Specialist reviewed this patient medical record for the purposes of chart review for care gap closure. The following were reviewed: chart review for care gap closure-kidney health evaluation for diabetes:uACR.    [facility]BCI Quality Team

## 2024-05-01 ENCOUNTER — Other Ambulatory Visit: Payer: Self-pay | Admitting: Nurse Practitioner

## 2024-05-14 ENCOUNTER — Other Ambulatory Visit: Payer: Self-pay | Admitting: Nurse Practitioner

## 2024-06-30 DIAGNOSIS — E1169 Type 2 diabetes mellitus with other specified complication: Secondary | ICD-10-CM

## 2024-07-13 ENCOUNTER — Other Ambulatory Visit: Payer: Self-pay | Admitting: Cardiovascular Disease

## 2024-07-19 ENCOUNTER — Other Ambulatory Visit: Payer: Self-pay | Admitting: Cardiovascular Disease

## 2024-07-19 DIAGNOSIS — Z760 Encounter for issue of repeat prescription: Secondary | ICD-10-CM

## 2024-08-28 ENCOUNTER — Ambulatory Visit: Admitting: Nurse Practitioner

## 2025-03-03 ENCOUNTER — Ambulatory Visit: Admitting: Nurse Practitioner
